# Patient Record
Sex: Female | Born: 1935
Health system: Southern US, Community
[De-identification: ages and names within clinical notes are randomized; demographics above are authoritative.]

## PROBLEM LIST (undated history)

## (undated) DIAGNOSIS — E7800 Pure hypercholesterolemia, unspecified: Secondary | ICD-10-CM

## (undated) DIAGNOSIS — K222 Esophageal obstruction: Secondary | ICD-10-CM

## (undated) DIAGNOSIS — T8859XA Other complications of anesthesia, initial encounter: Secondary | ICD-10-CM

## (undated) DIAGNOSIS — I10 Essential (primary) hypertension: Secondary | ICD-10-CM

## (undated) DIAGNOSIS — R51 Headache: Secondary | ICD-10-CM

## (undated) DIAGNOSIS — R42 Dizziness and giddiness: Secondary | ICD-10-CM

## (undated) DIAGNOSIS — Z9889 Other specified postprocedural states: Secondary | ICD-10-CM

## (undated) DIAGNOSIS — A499 Bacterial infection, unspecified: Secondary | ICD-10-CM

## (undated) DIAGNOSIS — K5790 Diverticulosis of intestine, part unspecified, without perforation or abscess without bleeding: Secondary | ICD-10-CM

## (undated) DIAGNOSIS — K219 Gastro-esophageal reflux disease without esophagitis: Secondary | ICD-10-CM

## (undated) DIAGNOSIS — E039 Hypothyroidism, unspecified: Secondary | ICD-10-CM

## (undated) DIAGNOSIS — R221 Localized swelling, mass and lump, neck: Secondary | ICD-10-CM

## (undated) DIAGNOSIS — R1310 Dysphagia, unspecified: Secondary | ICD-10-CM

## (undated) DIAGNOSIS — G454 Transient global amnesia: Secondary | ICD-10-CM

## (undated) DIAGNOSIS — T4145XA Adverse effect of unspecified anesthetic, initial encounter: Secondary | ICD-10-CM

## (undated) DIAGNOSIS — R112 Nausea with vomiting, unspecified: Secondary | ICD-10-CM

## (undated) DIAGNOSIS — M7989 Other specified soft tissue disorders: Secondary | ICD-10-CM

## (undated) DIAGNOSIS — Z8489 Family history of other specified conditions: Secondary | ICD-10-CM

## (undated) DIAGNOSIS — M1990 Unspecified osteoarthritis, unspecified site: Secondary | ICD-10-CM

## (undated) DIAGNOSIS — N390 Urinary tract infection, site not specified: Secondary | ICD-10-CM

## (undated) DIAGNOSIS — K589 Irritable bowel syndrome without diarrhea: Secondary | ICD-10-CM

## (undated) HISTORY — DX: Bacterial infection, unspecified: N39.0

## (undated) HISTORY — PX: APPENDECTOMY: SHX54

## (undated) HISTORY — DX: Gastro-esophageal reflux disease without esophagitis: K21.9

## (undated) HISTORY — DX: Diverticulosis of intestine, part unspecified, without perforation or abscess without bleeding: K57.90

## (undated) HISTORY — DX: Headache: R51

## (undated) HISTORY — PX: TONSILLECTOMY: SUR1361

## (undated) HISTORY — DX: Pure hypercholesterolemia, unspecified: E78.00

## (undated) HISTORY — PX: CYSTOSCOPY: SUR368

## (undated) HISTORY — DX: Essential (primary) hypertension: I10

## (undated) HISTORY — DX: Bacterial infection, unspecified: A49.9

## (undated) HISTORY — DX: Irritable bowel syndrome, unspecified: K58.9

## (undated) HISTORY — PX: PARTIAL HYSTERECTOMY: SHX80

## (undated) HISTORY — PX: ABDOMINAL HYSTERECTOMY: SHX81

---

## 1988-09-20 HISTORY — PX: CHOLECYSTECTOMY: SHX55

## 2004-12-31 ENCOUNTER — Ambulatory Visit: Payer: Self-pay | Admitting: Unknown Physician Specialty

## 2006-05-06 ENCOUNTER — Ambulatory Visit: Payer: Self-pay | Admitting: Gastroenterology

## 2007-11-21 ENCOUNTER — Ambulatory Visit: Payer: Self-pay | Admitting: Internal Medicine

## 2008-07-30 ENCOUNTER — Ambulatory Visit: Payer: Self-pay | Admitting: Unknown Physician Specialty

## 2009-11-04 ENCOUNTER — Ambulatory Visit: Payer: Self-pay | Admitting: Unknown Physician Specialty

## 2010-12-28 ENCOUNTER — Ambulatory Visit: Payer: Self-pay | Admitting: Gastroenterology

## 2010-12-30 LAB — PATHOLOGY REPORT

## 2011-02-08 ENCOUNTER — Ambulatory Visit: Payer: Self-pay | Admitting: Internal Medicine

## 2011-09-30 DIAGNOSIS — H2510 Age-related nuclear cataract, unspecified eye: Secondary | ICD-10-CM | POA: Diagnosis not present

## 2011-11-03 DIAGNOSIS — Z79899 Other long term (current) drug therapy: Secondary | ICD-10-CM | POA: Diagnosis not present

## 2011-11-03 DIAGNOSIS — I10 Essential (primary) hypertension: Secondary | ICD-10-CM | POA: Diagnosis not present

## 2011-11-03 DIAGNOSIS — E7800 Pure hypercholesterolemia, unspecified: Secondary | ICD-10-CM | POA: Diagnosis not present

## 2011-11-25 DIAGNOSIS — E7800 Pure hypercholesterolemia, unspecified: Secondary | ICD-10-CM | POA: Diagnosis not present

## 2011-11-25 DIAGNOSIS — J019 Acute sinusitis, unspecified: Secondary | ICD-10-CM | POA: Diagnosis not present

## 2011-11-25 DIAGNOSIS — I10 Essential (primary) hypertension: Secondary | ICD-10-CM | POA: Diagnosis not present

## 2011-11-25 DIAGNOSIS — J309 Allergic rhinitis, unspecified: Secondary | ICD-10-CM | POA: Diagnosis not present

## 2011-12-11 DIAGNOSIS — Z1211 Encounter for screening for malignant neoplasm of colon: Secondary | ICD-10-CM | POA: Diagnosis not present

## 2011-12-11 LAB — HEMOCCULT SLIDES (X 3 CARDS)

## 2011-12-16 DIAGNOSIS — K625 Hemorrhage of anus and rectum: Secondary | ICD-10-CM | POA: Diagnosis not present

## 2012-01-06 DIAGNOSIS — R195 Other fecal abnormalities: Secondary | ICD-10-CM | POA: Diagnosis not present

## 2012-01-06 DIAGNOSIS — K449 Diaphragmatic hernia without obstruction or gangrene: Secondary | ICD-10-CM | POA: Diagnosis not present

## 2012-01-06 DIAGNOSIS — Z86010 Personal history of colonic polyps: Secondary | ICD-10-CM | POA: Diagnosis not present

## 2012-02-21 ENCOUNTER — Ambulatory Visit: Payer: Self-pay | Admitting: Gastroenterology

## 2012-02-21 DIAGNOSIS — Z79899 Other long term (current) drug therapy: Secondary | ICD-10-CM | POA: Diagnosis not present

## 2012-02-21 DIAGNOSIS — K5730 Diverticulosis of large intestine without perforation or abscess without bleeding: Secondary | ICD-10-CM | POA: Diagnosis not present

## 2012-02-21 DIAGNOSIS — K589 Irritable bowel syndrome without diarrhea: Secondary | ICD-10-CM | POA: Diagnosis not present

## 2012-02-21 DIAGNOSIS — Z8371 Family history of colonic polyps: Secondary | ICD-10-CM | POA: Diagnosis not present

## 2012-02-21 DIAGNOSIS — K219 Gastro-esophageal reflux disease without esophagitis: Secondary | ICD-10-CM | POA: Diagnosis not present

## 2012-02-21 DIAGNOSIS — Z6829 Body mass index (BMI) 29.0-29.9, adult: Secondary | ICD-10-CM | POA: Diagnosis not present

## 2012-02-21 DIAGNOSIS — I10 Essential (primary) hypertension: Secondary | ICD-10-CM | POA: Diagnosis not present

## 2012-02-21 DIAGNOSIS — K449 Diaphragmatic hernia without obstruction or gangrene: Secondary | ICD-10-CM | POA: Diagnosis not present

## 2012-02-21 DIAGNOSIS — E785 Hyperlipidemia, unspecified: Secondary | ICD-10-CM | POA: Diagnosis not present

## 2012-02-21 DIAGNOSIS — Z7982 Long term (current) use of aspirin: Secondary | ICD-10-CM | POA: Diagnosis not present

## 2012-02-21 DIAGNOSIS — K921 Melena: Secondary | ICD-10-CM | POA: Diagnosis not present

## 2012-02-21 DIAGNOSIS — R195 Other fecal abnormalities: Secondary | ICD-10-CM | POA: Diagnosis not present

## 2012-02-21 DIAGNOSIS — K3189 Other diseases of stomach and duodenum: Secondary | ICD-10-CM | POA: Diagnosis not present

## 2012-06-23 DIAGNOSIS — J328 Other chronic sinusitis: Secondary | ICD-10-CM | POA: Diagnosis not present

## 2012-06-23 DIAGNOSIS — K219 Gastro-esophageal reflux disease without esophagitis: Secondary | ICD-10-CM | POA: Diagnosis not present

## 2012-06-23 DIAGNOSIS — J018 Other acute sinusitis: Secondary | ICD-10-CM | POA: Diagnosis not present

## 2012-06-23 DIAGNOSIS — J342 Deviated nasal septum: Secondary | ICD-10-CM | POA: Diagnosis not present

## 2012-07-07 DIAGNOSIS — J342 Deviated nasal septum: Secondary | ICD-10-CM | POA: Diagnosis not present

## 2012-07-07 DIAGNOSIS — J328 Other chronic sinusitis: Secondary | ICD-10-CM | POA: Diagnosis not present

## 2012-07-07 DIAGNOSIS — K219 Gastro-esophageal reflux disease without esophagitis: Secondary | ICD-10-CM | POA: Diagnosis not present

## 2012-07-11 DIAGNOSIS — H5710 Ocular pain, unspecified eye: Secondary | ICD-10-CM | POA: Diagnosis not present

## 2012-07-23 DIAGNOSIS — Z23 Encounter for immunization: Secondary | ICD-10-CM | POA: Diagnosis not present

## 2012-07-28 DIAGNOSIS — Z1231 Encounter for screening mammogram for malignant neoplasm of breast: Secondary | ICD-10-CM | POA: Diagnosis not present

## 2012-07-28 DIAGNOSIS — N393 Stress incontinence (female) (male): Secondary | ICD-10-CM | POA: Diagnosis not present

## 2012-07-28 DIAGNOSIS — Z79890 Hormone replacement therapy (postmenopausal): Secondary | ICD-10-CM | POA: Diagnosis not present

## 2012-07-28 DIAGNOSIS — Z8739 Personal history of other diseases of the musculoskeletal system and connective tissue: Secondary | ICD-10-CM | POA: Diagnosis not present

## 2012-07-28 DIAGNOSIS — N760 Acute vaginitis: Secondary | ICD-10-CM | POA: Diagnosis not present

## 2012-07-31 ENCOUNTER — Ambulatory Visit: Payer: Self-pay | Admitting: Otolaryngology

## 2012-07-31 DIAGNOSIS — J329 Chronic sinusitis, unspecified: Secondary | ICD-10-CM | POA: Diagnosis not present

## 2012-07-31 DIAGNOSIS — J328 Other chronic sinusitis: Secondary | ICD-10-CM | POA: Diagnosis not present

## 2012-08-11 ENCOUNTER — Encounter: Payer: Self-pay | Admitting: Internal Medicine

## 2012-08-11 ENCOUNTER — Ambulatory Visit (INDEPENDENT_AMBULATORY_CARE_PROVIDER_SITE_OTHER): Payer: Medicare Other | Admitting: Internal Medicine

## 2012-08-11 VITALS — BP 118/74 | HR 67 | Temp 98.3°F | Ht 64.0 in | Wt 157.0 lb

## 2012-08-11 DIAGNOSIS — I10 Essential (primary) hypertension: Secondary | ICD-10-CM

## 2012-08-11 DIAGNOSIS — E7800 Pure hypercholesterolemia, unspecified: Secondary | ICD-10-CM

## 2012-08-11 DIAGNOSIS — K589 Irritable bowel syndrome without diarrhea: Secondary | ICD-10-CM

## 2012-08-11 DIAGNOSIS — K219 Gastro-esophageal reflux disease without esophagitis: Secondary | ICD-10-CM

## 2012-08-11 DIAGNOSIS — R51 Headache: Secondary | ICD-10-CM | POA: Diagnosis not present

## 2012-08-11 LAB — HEPATIC FUNCTION PANEL
ALT: 14 U/L (ref 0–35)
Albumin: 3.7 g/dL (ref 3.5–5.2)
Bilirubin, Direct: 0.1 mg/dL (ref 0.0–0.3)
Total Protein: 7.6 g/dL (ref 6.0–8.3)

## 2012-08-11 LAB — SEDIMENTATION RATE: Sed Rate: 44 mm/hr — ABNORMAL HIGH (ref 0–22)

## 2012-08-11 LAB — BASIC METABOLIC PANEL
Calcium: 9 mg/dL (ref 8.4–10.5)
GFR: 61.2 mL/min (ref >60.00)
Potassium: 4 mEq/L (ref 3.5–5.1)
Sodium: 137 mEq/L (ref 135–145)

## 2012-08-11 LAB — LIPID PANEL
Cholesterol: 152 mg/dL (ref 0–200)
HDL: 70 mg/dL (ref >39.00)
Triglycerides: 217 mg/dL — ABNORMAL HIGH (ref 0.0–149.0)

## 2012-08-11 LAB — C-REACTIVE PROTEIN: CRP: 0.6 mg/dL (ref 0.5–20.0)

## 2012-08-11 NOTE — Patient Instructions (Addendum)
It was nice seeing you today.  I am going to schedule an appt with neurology for evaluation for the persistent problem with your head.  Let me know if any problems.

## 2012-08-12 ENCOUNTER — Encounter: Payer: Self-pay | Admitting: Internal Medicine

## 2012-08-12 ENCOUNTER — Other Ambulatory Visit: Payer: Self-pay | Admitting: Internal Medicine

## 2012-08-12 DIAGNOSIS — R51 Headache: Secondary | ICD-10-CM

## 2012-08-12 NOTE — Progress Notes (Signed)
Subjective:    [name], female    DOB: [birth year], 78 y.o.   MRN: [id number]  HPI 78 year old female with past history of hypertension, hypercholesterolemia, diverticulosis and IBS who comes in today for a scheduled follow up.  States she is doing relatively well.  Still having problems with her head.  She describes a pressure sensation when she coughs or bends over.  Has seen Dr Chestine Spore (ENT).  Had initial CT - ok.  Saw Dr Oren Bracket.  States her eyes checked out fine.  Followed up with Dr Chestine Spore and had repeat scan.  Was told she had some fluid around her eustachian tube.  She continues to have the sensation, but still notices when she coughs or bends over.  She is now on Protonix bid.  Dr Chestine Spore felt her cough was related to acid reflux.  This has helped the cough.  No vision change.  No chest pain or tightness.  Bowels doing ok.   Past Medical History  Diagnosis Date  . Hypercholesterolemia   . Hypertension   . IBS (irritable bowel syndrome)   . GERD (gastroesophageal reflux disease)   . Headache     h/o recurrent vascular headaches  . Diverticulosis      Outpatient Encounter Prescriptions as of 08/11/2012  Medication Sig Dispense Refill  . amitriptyline (ELAVIL) 25 MG tablet Take 25 mg by mouth at bedtime.      Marland Kitchen amLODipine (NORVASC) 10 MG tablet Take 10 mg by mouth daily.      Marland Kitchen aspirin 81 MG tablet Take 81 mg by mouth daily.      Marland Kitchen atorvastatin (LIPITOR) 40 MG tablet Take 40 mg by mouth daily.      Marland Kitchen estrogens, conjugated, (PREMARIN) 0.9 MG tablet Take 0.9 mg by mouth daily. Take daily for 21 days then do not take for 7 days.      . metoprolol succinate (TOPROL-XL) 50 MG 24 hr tablet Take 50 mg by mouth 2 (two) times daily. Take with or immediately following a meal.      . pantoprazole (PROTONIX) 40 MG tablet Take 40 mg by mouth daily.        Review of Systems Patient denies any lightheadedness or dizziness.  Describes the fullness in her head as outlined. No chest  pain, tightness or palpitations.  No increased shortness of breath or congestion.  Cough is better with increasing the Protonix.  No nausea or vomiting.  No abdominal pain or cramping.  No bowel change, such as diarrhea, constipation, BRBPR or melana.  No urine change.        Objective:   Physical Exam Filed Vitals:   08/11/12 0827  BP: 118/74  Pulse: 67  Temp: 98.3 F (59.45 C)   78 year old female in no acute distress.   HEENT:  Nares - clear.  OP- without lesions or erythema.  TMs visualized without erythema.  No significant tenderness to palpation over the sinuses or temporal artery region NECK:  Supple, nontender.     HEART:  Appears to be regular. LUNGS:  Without crackles or wheezing audible.  Respirations even and unlabored.   RADIAL PULSE:  Equal bilaterally.  ABDOMEN:  Soft, nontender.  No audible abdominal bruit.   EXTREMITIES:  No increased edema to be present.                     Assessment & Plan:  HEAD FULLNESS.  Unclear etiology.  Has seen ENT  and Opthalmology.  Scan x 2 negative.  Obtain records. Check ESR and CRP.  Will refer to neurology for evaluation.  Has a remote history of migraines.  Has not had a migraine in years.    GI.  Had a colonoscopy in 11/04/09.  Revealed one polyp, diverticulosis and internal hemorrhoids.  Recommended follow up in 2014.    CAROTID BRUIT.  Documented history of right carotid bruit.  Previous carotid ultrasound revealed no hemodynamically significant stenosis.   HEALTH MAINTENANCE.  Physical 11/25/11.  GI as outlined.  Pelvics, paps and breast exams are done through Dr Jonathon Resides.  Up to date.  Mammogram 07/28/12 - negative.   Had her flu vaccine. Had zostavax six months ago.

## 2012-08-12 NOTE — Assessment & Plan Note (Signed)
On Protonix bid now.  No significant acid reflux.  Cough improved.  Follow.

## 2012-08-12 NOTE — Progress Notes (Signed)
Pt is coming in at 11:00 on 08/14/12 for lab only.  Pt aware.

## 2012-08-12 NOTE — Assessment & Plan Note (Signed)
Blood pressure as outlined.  Same meds.  Follow.  Check metabolic panel.    

## 2012-08-12 NOTE — Assessment & Plan Note (Signed)
Bowels stable.  Colonoscopy as outlined.  Sees Dr Oh.   

## 2012-08-12 NOTE — Assessment & Plan Note (Signed)
Low cholesterol diet and exercise.  Continue lipitor.  Check lipid panel and liver function.  

## 2012-08-14 ENCOUNTER — Other Ambulatory Visit (INDEPENDENT_AMBULATORY_CARE_PROVIDER_SITE_OTHER): Payer: Medicare Other

## 2012-08-14 DIAGNOSIS — R51 Headache: Secondary | ICD-10-CM | POA: Diagnosis not present

## 2012-08-14 LAB — C-REACTIVE PROTEIN: CRP: <0.5 mg/dL (ref 0.5–20.0)

## 2012-08-18 ENCOUNTER — Encounter: Payer: Self-pay | Admitting: *Deleted

## 2012-08-28 DIAGNOSIS — R51 Headache: Secondary | ICD-10-CM | POA: Diagnosis not present

## 2012-09-15 ENCOUNTER — Encounter: Payer: Self-pay | Admitting: Internal Medicine

## 2012-09-20 ENCOUNTER — Telehealth: Payer: Self-pay | Admitting: Internal Medicine

## 2012-09-20 DIAGNOSIS — R5383 Other fatigue: Secondary | ICD-10-CM

## 2012-09-20 DIAGNOSIS — I10 Essential (primary) hypertension: Secondary | ICD-10-CM

## 2012-09-20 DIAGNOSIS — E7800 Pure hypercholesterolemia, unspecified: Secondary | ICD-10-CM

## 2012-09-20 NOTE — Telephone Encounter (Signed)
Pt has a cpe scheduled for 12/11/12.  I placed an order for labs to be done prior to her physical.  Please notify pt and place on lab schedule - an appt for fasting labs 1-2 days before her cpe.  Thanks.

## 2012-09-21 NOTE — Telephone Encounter (Signed)
Lab appointment 3/21 pt aware

## 2012-09-25 DIAGNOSIS — R51 Headache: Secondary | ICD-10-CM | POA: Diagnosis not present

## 2012-09-25 DIAGNOSIS — IMO0002 Reserved for concepts with insufficient information to code with codable children: Secondary | ICD-10-CM | POA: Diagnosis not present

## 2012-09-25 DIAGNOSIS — J328 Other chronic sinusitis: Secondary | ICD-10-CM | POA: Diagnosis not present

## 2012-10-12 ENCOUNTER — Other Ambulatory Visit: Payer: Self-pay | Admitting: Internal Medicine

## 2012-10-12 MED ORDER — ATORVASTATIN CALCIUM 40 MG PO TABS: 40.0000 mg | ORAL_TABLET | Freq: Every day | ORAL | Status: DC

## 2012-10-12 NOTE — Telephone Encounter (Signed)
atorvastatin (LIPITOR) 40 MG tablet  ?

## 2012-10-20 ENCOUNTER — Ambulatory Visit: Payer: Self-pay | Admitting: Neurology

## 2012-10-20 DIAGNOSIS — I6789 Other cerebrovascular disease: Secondary | ICD-10-CM | POA: Diagnosis not present

## 2012-10-20 DIAGNOSIS — R51 Headache: Secondary | ICD-10-CM | POA: Diagnosis not present

## 2012-10-20 DIAGNOSIS — H5710 Ocular pain, unspecified eye: Secondary | ICD-10-CM | POA: Diagnosis not present

## 2012-11-15 ENCOUNTER — Other Ambulatory Visit: Payer: Self-pay | Admitting: *Deleted

## 2012-11-16 ENCOUNTER — Other Ambulatory Visit: Payer: Self-pay | Admitting: *Deleted

## 2012-11-16 MED ORDER — METOPROLOL SUCCINATE ER 50 MG PO TB24: 50.0000 mg | ORAL_TABLET | Freq: Two times a day (BID) | ORAL | Status: DC

## 2012-11-16 NOTE — Telephone Encounter (Signed)
Sent in to pharmacy.  

## 2012-12-08 ENCOUNTER — Other Ambulatory Visit (INDEPENDENT_AMBULATORY_CARE_PROVIDER_SITE_OTHER): Payer: Medicare Other

## 2012-12-08 DIAGNOSIS — E7800 Pure hypercholesterolemia, unspecified: Secondary | ICD-10-CM | POA: Diagnosis not present

## 2012-12-08 DIAGNOSIS — R5381 Other malaise: Secondary | ICD-10-CM | POA: Diagnosis not present

## 2012-12-08 DIAGNOSIS — I10 Essential (primary) hypertension: Secondary | ICD-10-CM | POA: Diagnosis not present

## 2012-12-08 DIAGNOSIS — R5383 Other fatigue: Secondary | ICD-10-CM

## 2012-12-08 LAB — LIPID PANEL
Cholesterol: 151 mg/dL (ref 0–200)
Triglycerides: 244 mg/dL — ABNORMAL HIGH (ref 0.0–149.0)

## 2012-12-08 LAB — CBC WITH DIFFERENTIAL/PLATELET
Basophils Absolute: 0.1 10*3/uL (ref 0.0–0.1)
Eosinophils Absolute: 0.2 10*3/uL (ref 0.0–0.7)
HCT: 38.6 % (ref 36.0–46.0)
Lymphs Abs: 1.4 10*3/uL (ref 0.7–4.0)
MCHC: 33.4 g/dL (ref 30.0–36.0)
MCV: 94.1 fl (ref 78.0–100.0)
Monocytes Absolute: 0.4 10*3/uL (ref 0.1–1.0)
Neutro Abs: 4.8 10*3/uL (ref 1.4–7.7)
Platelets: 202 10*3/uL (ref 150.0–400.0)
RDW: 12.7 % (ref 11.5–14.6)

## 2012-12-08 LAB — HEPATIC FUNCTION PANEL
ALT: 16 U/L (ref 0–35)
AST: 20 U/L (ref 0–37)
Alkaline Phosphatase: 81 U/L (ref 39–117)
Total Bilirubin: 0.6 mg/dL (ref 0.3–1.2)

## 2012-12-08 LAB — BASIC METABOLIC PANEL
Calcium: 9 mg/dL (ref 8.4–10.5)
Creatinine, Ser: 1.1 mg/dL (ref 0.4–1.2)
GFR: 49.45 mL/min — ABNORMAL LOW (ref >60.00)
Sodium: 137 mEq/L (ref 135–145)

## 2012-12-08 LAB — LDL CHOLESTEROL, DIRECT: Direct LDL: 64.9 mg/dL

## 2012-12-11 ENCOUNTER — Encounter: Payer: Self-pay | Admitting: Internal Medicine

## 2012-12-11 ENCOUNTER — Ambulatory Visit (INDEPENDENT_AMBULATORY_CARE_PROVIDER_SITE_OTHER): Payer: Medicare Other | Admitting: Internal Medicine

## 2012-12-11 ENCOUNTER — Ambulatory Visit: Payer: Medicare Other | Admitting: Internal Medicine

## 2012-12-11 VITALS — BP 120/60 | HR 76 | Temp 97.9°F | Ht 64.0 in | Wt 160.5 lb

## 2012-12-11 DIAGNOSIS — K219 Gastro-esophageal reflux disease without esophagitis: Secondary | ICD-10-CM | POA: Diagnosis not present

## 2012-12-11 DIAGNOSIS — I10 Essential (primary) hypertension: Secondary | ICD-10-CM | POA: Diagnosis not present

## 2012-12-11 DIAGNOSIS — K589 Irritable bowel syndrome without diarrhea: Secondary | ICD-10-CM

## 2012-12-11 DIAGNOSIS — E7800 Pure hypercholesterolemia, unspecified: Secondary | ICD-10-CM

## 2012-12-11 NOTE — Patient Instructions (Signed)
Start taking protonix 40mg  one 30 minutes before breakfast and one 30 minutes before your evening meal.  Take ranitidine 150mg  before bed.  Let me know if persistent problems.

## 2012-12-12 ENCOUNTER — Telehealth: Payer: Self-pay | Admitting: Internal Medicine

## 2012-12-12 ENCOUNTER — Encounter: Payer: Self-pay | Admitting: Internal Medicine

## 2012-12-12 DIAGNOSIS — G508 Other disorders of trigeminal nerve: Secondary | ICD-10-CM | POA: Diagnosis not present

## 2012-12-12 NOTE — Assessment & Plan Note (Signed)
Low cholesterol diet and exercise.  Continue lipitor.  Lipid panel 12/08/12 revealed total cholesterol 151, triglycerides 244, HDL 66 and LDL 65.  Follow.      

## 2012-12-12 NOTE — Assessment & Plan Note (Signed)
Blood pressure as outlined.  Same meds.  Follow.  Follow metabolic panel.    

## 2012-12-12 NOTE — Telephone Encounter (Signed)
I look back in patient's chart and saw where you mentioned patient taking protonix bid. I was checking to see if this is ok to fill?

## 2012-12-12 NOTE — Telephone Encounter (Signed)
Ok to send in rx for protonix 40 bid

## 2012-12-12 NOTE — Assessment & Plan Note (Signed)
Was on Protonix bid. Helped some. Back on q day now.  Will increase protonix back to bid and add ranitidine before bed.  Follow.  Get her back in soon to reassess.  If persistent problems, will need referral back to GI.  Follow.

## 2012-12-12 NOTE — Progress Notes (Signed)
Subjective:    [name], female    DOB: [birth year], 79 y.o.   MRN: [id number]  HPI 79 year old female with past history of hypertension, hypercholesterolemia, diverticulosis and IBS who comes in today to follow up on these issues as well as for a complete physical exam.  States she is doing relatively well.  She is seeing Dr Sherryll Burger for her head pain.  See previous note for details.  Had MRI.  Planning to follow up with him tomorrow.  On Neurontin (2 qhs).  Has helped.  She is now on Protonix q day.  Was previously instructed to take it bid and it helped some.   Dr Chestine Spore felt her cough was related to acid reflux.  Throat feels irritated.  No actual dysphagia.  She does have to watche when eating chicken or something dry.  No vision change.  No chest pain or tightness.  Bowels doing ok.  Up to date with her gyn exam.  Sees Dr Jonathon Resides.  Had exam in the fall of 2013.   Past Medical History  Diagnosis Date  . Hypercholesterolemia   . Hypertension   . IBS (irritable bowel syndrome)   . GERD (gastroesophageal reflux disease)   . Headache     h/o recurrent vascular headaches  . Diverticulosis   . Urinary tract bacterial infections      Outpatient Encounter Prescriptions as of 12/11/2012  Medication Sig Dispense Refill  . amitriptyline (ELAVIL) 25 MG tablet Take 25 mg by mouth at bedtime.      Marland Kitchen amLODipine (NORVASC) 10 MG tablet Take 10 mg by mouth daily.      Marland Kitchen aspirin 81 MG tablet Take 81 mg by mouth daily.      Marland Kitchen atorvastatin (LIPITOR) 40 MG tablet Take 1 tablet (40 mg total) by mouth daily.  90 tablet  3  . estrogens, conjugated, (PREMARIN) 0.9 MG tablet Take 0.9 mg by mouth daily. Take daily for 21 days then do not take for 7 days.      Marland Kitchen gabapentin (NEURONTIN) 100 MG capsule Take 100 mg by mouth 2 (two) times daily.      . metoprolol succinate (TOPROL-XL) 50 MG 24 hr tablet Take 1 tablet (50 mg total) by mouth 2 (two) times daily. Take with or immediately following a meal.   180 tablet  1  . pantoprazole (PROTONIX) 40 MG tablet Take 40 mg by mouth daily.       No facility-administered encounter medications on file as of 12/11/2012.    Review of Systems Patient denies any lightheadedness or dizziness.  Head pain is better.   No chest pain, tightness or palpitations.  No increased shortness of breath or congestion.  Swallowing issues as outlined.  Throat feels a little irritated.  No nausea or vomiting.  No abdominal pain or cramping.  No bowel change, such as diarrhea, constipation, BRBPR or melana.  No urine change.        Objective:   Physical Exam  Filed Vitals:   12/11/12 0955  BP: 120/60  Pulse: 76  Temp: 97.9 F (36.6 C)   Blood pressure recheck:  136/68, pulse 69  79 year old female in no acute distress.   HEENT:  Nares- clear.  Oropharynx - without lesions. NECK:  Supple.  Nontender.  HEART:  Appears to be regular. LUNGS:  No crackles or wheezing audible.  Respirations even and unlabored.  RADIAL PULSE:  Equal bilaterally.    BREASTS:  No nipple  discharge or nipple retraction present.  Could not appreciate any distinct nodules or axillary adenopathy.  ABDOMEN:  Soft, nontender.  Bowel sounds present and normal.  No audible abdominal bruit.  GU:  Performed by Dr Jonathon Resides.  Up to date.  EXTREMITIES:  No increased edema present.  DP pulses palpable and equal bilaterally.            Assessment & Plan:  HEAD FULLNESS.  Has seen ENT and Opthalmology.  Scan x 2 negative.  Now seeing Dr Sherryll Burger.  had MRI.  Has follow up tomorrow.  Improved on neurontin.    GI.  Had a colonoscopy in 11/04/09.  Revealed one polyp, diverticulosis and internal hemorrhoids.  Recommended follow up in 2014.    CAROTID BRUIT.  Documented history of right carotid bruit.  Previous carotid ultrasound revealed no hemodynamically significant stenosis.   HEALTH MAINTENANCE.  Physical today.  GI as outlined.  Pelvics, paps and breast exams are done through Dr Jonathon Resides.  Up to date.   Mammogram 07/28/12 - negative.   Had her flu vaccine. Had zostavax six months ago.

## 2012-12-12 NOTE — Assessment & Plan Note (Signed)
Bowels stable.  Colonoscopy as outlined.  Sees Dr Oh.   

## 2012-12-12 NOTE — Telephone Encounter (Signed)
Pt came in today stating dr scott up quantity of pantoprazole tab 40mg  dr   From 1 tablet daily to 2 daily  Pt stated she needed refill  [facility] mail order

## 2012-12-13 MED ORDER — PANTOPRAZOLE SODIUM 40 MG PO TBEC: 40.0000 mg | DELAYED_RELEASE_TABLET | Freq: Two times a day (BID) | ORAL | Status: DC

## 2012-12-13 NOTE — Telephone Encounter (Signed)
Rx sent in to pharmacy. 

## 2013-01-29 DIAGNOSIS — D485 Neoplasm of uncertain behavior of skin: Secondary | ICD-10-CM | POA: Diagnosis not present

## 2013-01-29 DIAGNOSIS — L980 Pyogenic granuloma: Secondary | ICD-10-CM | POA: Diagnosis not present

## 2013-01-29 DIAGNOSIS — L819 Disorder of pigmentation, unspecified: Secondary | ICD-10-CM | POA: Diagnosis not present

## 2013-01-29 DIAGNOSIS — D1801 Hemangioma of skin and subcutaneous tissue: Secondary | ICD-10-CM | POA: Diagnosis not present

## 2013-01-29 DIAGNOSIS — L578 Other skin changes due to chronic exposure to nonionizing radiation: Secondary | ICD-10-CM | POA: Diagnosis not present

## 2013-01-29 DIAGNOSIS — L723 Sebaceous cyst: Secondary | ICD-10-CM | POA: Diagnosis not present

## 2013-02-13 ENCOUNTER — Other Ambulatory Visit: Payer: Self-pay | Admitting: *Deleted

## 2013-02-13 MED ORDER — AMLODIPINE BESYLATE 10 MG PO TABS: 10.0000 mg | ORAL_TABLET | Freq: Every day | ORAL | Status: DC

## 2013-02-16 ENCOUNTER — Ambulatory Visit (INDEPENDENT_AMBULATORY_CARE_PROVIDER_SITE_OTHER): Payer: Medicare Other | Admitting: Internal Medicine

## 2013-02-16 ENCOUNTER — Encounter: Payer: Self-pay | Admitting: Internal Medicine

## 2013-02-16 VITALS — BP 130/65 | HR 74 | Temp 98.7°F | Ht 64.0 in | Wt 162.5 lb

## 2013-02-16 DIAGNOSIS — I10 Essential (primary) hypertension: Secondary | ICD-10-CM | POA: Diagnosis not present

## 2013-02-16 DIAGNOSIS — E7800 Pure hypercholesterolemia, unspecified: Secondary | ICD-10-CM

## 2013-02-16 DIAGNOSIS — R739 Hyperglycemia, unspecified: Secondary | ICD-10-CM

## 2013-02-16 DIAGNOSIS — R7309 Other abnormal glucose: Secondary | ICD-10-CM

## 2013-02-16 DIAGNOSIS — K219 Gastro-esophageal reflux disease without esophagitis: Secondary | ICD-10-CM

## 2013-02-16 DIAGNOSIS — M7989 Other specified soft tissue disorders: Secondary | ICD-10-CM

## 2013-02-16 DIAGNOSIS — K589 Irritable bowel syndrome without diarrhea: Secondary | ICD-10-CM | POA: Diagnosis not present

## 2013-02-18 ENCOUNTER — Encounter: Payer: Self-pay | Admitting: Internal Medicine

## 2013-02-18 DIAGNOSIS — M7989 Other specified soft tissue disorders: Secondary | ICD-10-CM | POA: Insufficient documentation

## 2013-02-18 NOTE — Assessment & Plan Note (Signed)
Bilateral leg swelling - intermittently.  Better in the am.  Worse as the day progresses.  Discussed the need for support hose and leg elevation.  Avoid increased salt intake.  Follow.

## 2013-02-18 NOTE — Assessment & Plan Note (Signed)
Bowels stable.  Colonoscopy as outlined.  Sees Dr Oh.   

## 2013-02-18 NOTE — Assessment & Plan Note (Addendum)
On Protonix in the am and Zantac in the evening.  Symptoms controlled.  Follow.  EGD 02/21/12 revealed hiatal hernia otherwise normal.    

## 2013-02-18 NOTE — Progress Notes (Signed)
Subjective:    [name], female    DOB: 10-Jan-1932, 79 y.o.   MRN: [id number]  HPI 79 year old female with past history of hypertension, hypercholesterolemia, diverticulosis and IBS who comes in today for a scheduled follow up.  States she is doing relatively well.  She is seeing Dr Sherryll Burger for her head pain.  She is now on Protonix q am and Zantac in the evening.  She has stopped the second protonix.  Feels her symptoms are well controlled on the current regimen.   No vision change.  No chest pain or tightness.  Bowels doing ok.  Up to date with her gyn exam.  Sees Dr Jonathon Resides.  Had exam in the fall of 2013.    Past Medical History  Diagnosis Date  . Hypercholesterolemia   . Hypertension   . IBS (irritable bowel syndrome)   . GERD (gastroesophageal reflux disease)   . Headache(784.0)     h/o recurrent vascular headaches  . Diverticulosis   . Urinary tract bacterial infections      Outpatient Encounter Prescriptions as of 02/16/2013  Medication Sig Dispense Refill  . amitriptyline (ELAVIL) 25 MG tablet Take 25 mg by mouth at bedtime.      Marland Kitchen amLODipine (NORVASC) 10 MG tablet Take 1 tablet (10 mg total) by mouth daily.  30 tablet  5  . aspirin 81 MG tablet Take 81 mg by mouth daily.      Marland Kitchen atorvastatin (LIPITOR) 40 MG tablet Take 1 tablet (40 mg total) by mouth daily.  90 tablet  3  . estrogens, conjugated, (PREMARIN) 0.9 MG tablet Take 0.9 mg by mouth daily. Take daily for 21 days then do not take for 7 days.      Marland Kitchen gabapentin (NEURONTIN) 100 MG capsule Take 100 mg by mouth 2 (two) times daily.      . metoprolol succinate (TOPROL-XL) 50 MG 24 hr tablet Take 1 tablet (50 mg total) by mouth 2 (two) times daily. Take with or immediately following a meal.  180 tablet  1  . pantoprazole (PROTONIX) 40 MG tablet Take 1 tablet (40 mg total) by mouth 2 (two) times daily.  180 tablet  1   No facility-administered encounter medications on file as of 02/16/2013.    Review of  Systems Patient denies any lightheadedness or dizziness.  Head pain is better.  Not reported as an issue today.  No chest pain, tightness or palpitations.  No increased shortness of breath or congestion.  Acid reflux and swallowing issues better.  Quit the second protonix a week ago.  No on Protonix in the am and zantac in the evening.  No nausea or vomiting.  No abdominal pain or cramping.  No bowel change, such as diarrhea, constipation, BRBPR or melana.  No urine change.        Objective:   Physical Exam  Filed Vitals:   02/16/13 0945  BP: 130/65  Pulse: 74  Temp: 98.7 F (38.57 C)   79 year old female in no acute distress.   HEENT:  Nares- clear.  Oropharynx - without lesions. NECK:  Supple.  Nontender.  HEART:  Appears to be regular. LUNGS:  No crackles or wheezing audible.  Respirations even and unlabored.  RADIAL PULSE:  Equal bilaterally.    ABDOMEN:  Soft, nontender.  Bowel sounds present and normal.  No audible abdominal bruit. EXTREMITIES:  No increased edema present.  DP pulses palpable and equal bilaterally.  Assessment & Plan:  HEAD FULLNESS.  Has seen ENT and Opthalmology.  Scan x 2 negative.  Now seeing Dr Sherryll Burger.  Had MRI.   Improved on neurontin.  Not reported as an issue today.    HYPERGLYCEMIA.  Sugar slightly increased.  Recheck fasting glucose and a1c.  Low carb diet.  Follow.   GI.  Had a colonoscopy in 11/04/09.  Revealed one polyp, diverticulosis and internal hemorrhoids.  Recommended follow up in 2014.  Follow up colonoscopy 02/21/12 for heme positive stool revealed diverticulosis otherwise normal.    CAROTID BRUIT.  Documented history of right carotid bruit.  Previous carotid ultrasound revealed no hemodynamically significant stenosis.   HEALTH MAINTENANCE.  Physical 12/11/12.  GI as outlined.  Pelvics, paps and breast exams are done through Dr Jonathon Resides.  Up to date.  Mammogram 07/28/12 - negative.

## 2013-02-18 NOTE — Assessment & Plan Note (Signed)
Low cholesterol diet and exercise.  Continue lipitor.  Lipid panel 12/08/12 revealed total cholesterol 151, triglycerides 244, HDL 66 and LDL 65.  Follow.

## 2013-02-18 NOTE — Assessment & Plan Note (Signed)
Blood pressure as outlined.  Same meds.  Follow.  Follow metabolic panel.    

## 2013-02-20 ENCOUNTER — Other Ambulatory Visit: Payer: Self-pay | Admitting: *Deleted

## 2013-02-20 MED ORDER — AMLODIPINE BESYLATE 10 MG PO TABS: 10.0000 mg | ORAL_TABLET | Freq: Every day | ORAL | Status: DC

## 2013-02-20 NOTE — Telephone Encounter (Signed)
Pt called stating the she received a 30 day RX for her Norvasc & a letter stating that Dr. Lorin Picket denied her 54 quantity. I called pt & informed her that it was never denied, just sent in the wrong quantity. I have sent it in again for 90 days supply with one refill.

## 2013-04-02 ENCOUNTER — Other Ambulatory Visit: Payer: Self-pay | Admitting: *Deleted

## 2013-04-02 MED ORDER — PANTOPRAZOLE SODIUM 40 MG PO TBEC: 40.0000 mg | DELAYED_RELEASE_TABLET | Freq: Two times a day (BID) | ORAL | Status: DC

## 2013-04-07 ENCOUNTER — Telehealth: Payer: Self-pay | Admitting: Internal Medicine

## 2013-04-07 DIAGNOSIS — M7989 Other specified soft tissue disorders: Secondary | ICD-10-CM

## 2013-04-07 NOTE — Telephone Encounter (Signed)
pts husband was here for a visit.  Having persistent leg swelling.  Request referral to vascular surgery for evaluation.  Order placed for referral.

## 2013-04-18 ENCOUNTER — Other Ambulatory Visit (INDEPENDENT_AMBULATORY_CARE_PROVIDER_SITE_OTHER): Payer: Medicare Other

## 2013-04-18 DIAGNOSIS — E7800 Pure hypercholesterolemia, unspecified: Secondary | ICD-10-CM | POA: Diagnosis not present

## 2013-04-18 DIAGNOSIS — I10 Essential (primary) hypertension: Secondary | ICD-10-CM | POA: Diagnosis not present

## 2013-04-18 DIAGNOSIS — R7309 Other abnormal glucose: Secondary | ICD-10-CM | POA: Diagnosis not present

## 2013-04-18 DIAGNOSIS — R739 Hyperglycemia, unspecified: Secondary | ICD-10-CM

## 2013-04-18 LAB — BASIC METABOLIC PANEL
CO2: 27 mEq/L (ref 19–32)
Chloride: 104 mEq/L (ref 96–112)
Creatinine, Ser: 1.2 mg/dL (ref 0.4–1.2)
Potassium: 4.6 mEq/L (ref 3.5–5.1)
Sodium: 139 mEq/L (ref 135–145)

## 2013-04-18 LAB — HEPATIC FUNCTION PANEL
ALT: 13 U/L (ref 0–35)
Albumin: 3.5 g/dL (ref 3.5–5.2)
Bilirubin, Direct: 0 mg/dL (ref 0.0–0.3)
Total Protein: 7.2 g/dL (ref 6.0–8.3)

## 2013-04-18 LAB — LIPID PANEL
Cholesterol: 150 mg/dL (ref 0–200)
LDL Cholesterol: 43 mg/dL (ref 0–99)
Triglycerides: 181 mg/dL — ABNORMAL HIGH (ref 0.0–149.0)
VLDL: 36.2 mg/dL (ref 0.0–40.0)

## 2013-04-18 LAB — HEMOGLOBIN A1C: Hgb A1c MFr Bld: 5.8 % (ref 4.6–6.5)

## 2013-04-19 ENCOUNTER — Other Ambulatory Visit: Payer: Self-pay | Admitting: Internal Medicine

## 2013-04-19 DIAGNOSIS — N289 Disorder of kidney and ureter, unspecified: Secondary | ICD-10-CM

## 2013-04-19 NOTE — Progress Notes (Signed)
Order placed for f/u met b and urine.  

## 2013-05-01 DIAGNOSIS — I8310 Varicose veins of unspecified lower extremity with inflammation: Secondary | ICD-10-CM | POA: Diagnosis not present

## 2013-05-04 ENCOUNTER — Telehealth: Payer: Self-pay | Admitting: Internal Medicine

## 2013-05-04 ENCOUNTER — Other Ambulatory Visit (INDEPENDENT_AMBULATORY_CARE_PROVIDER_SITE_OTHER): Payer: Medicare Other

## 2013-05-04 DIAGNOSIS — N289 Disorder of kidney and ureter, unspecified: Secondary | ICD-10-CM

## 2013-05-04 DIAGNOSIS — M79609 Pain in unspecified limb: Secondary | ICD-10-CM | POA: Diagnosis not present

## 2013-05-04 DIAGNOSIS — M7989 Other specified soft tissue disorders: Secondary | ICD-10-CM | POA: Diagnosis not present

## 2013-05-04 DIAGNOSIS — I890 Lymphedema, not elsewhere classified: Secondary | ICD-10-CM | POA: Diagnosis not present

## 2013-05-04 DIAGNOSIS — I8310 Varicose veins of unspecified lower extremity with inflammation: Secondary | ICD-10-CM | POA: Diagnosis not present

## 2013-05-04 LAB — URINALYSIS, ROUTINE W REFLEX MICROSCOPIC
Leukocytes, UA: NEGATIVE
Nitrite: NEGATIVE
RBC / HPF: NONE SEEN (ref 0–?)
Specific Gravity, Urine: 1.015 (ref 1.000–1.030)
Urine Glucose: NEGATIVE
Urobilinogen, UA: 0.2 (ref 0.0–1.0)
WBC, UA: NONE SEEN (ref 0–?)

## 2013-05-04 LAB — BASIC METABOLIC PANEL
BUN: 19 mg/dL (ref 6–23)
Calcium: 8.9 mg/dL (ref 8.4–10.5)
Creatinine, Ser: 1.2 mg/dL (ref 0.4–1.2)
GFR: 44.37 mL/min — ABNORMAL LOW (ref >60.00)

## 2013-05-04 MED ORDER — METOPROLOL SUCCINATE ER 50 MG PO TB24: 50.0000 mg | ORAL_TABLET | Freq: Two times a day (BID) | ORAL | Status: DC

## 2013-05-04 NOTE — Telephone Encounter (Signed)
Pt is needing refill on Meloprolo ER Tablets 50 mg. Pt uses [facility]

## 2013-05-04 NOTE — Telephone Encounter (Signed)
done

## 2013-05-08 ENCOUNTER — Other Ambulatory Visit: Payer: Self-pay | Admitting: Internal Medicine

## 2013-05-08 ENCOUNTER — Encounter: Payer: Self-pay | Admitting: *Deleted

## 2013-05-08 DIAGNOSIS — E7800 Pure hypercholesterolemia, unspecified: Secondary | ICD-10-CM

## 2013-05-08 DIAGNOSIS — I10 Essential (primary) hypertension: Secondary | ICD-10-CM

## 2013-05-08 NOTE — Progress Notes (Signed)
Order placed for f/u labs.  

## 2013-06-12 DIAGNOSIS — R0989 Other specified symptoms and signs involving the circulatory and respiratory systems: Secondary | ICD-10-CM | POA: Diagnosis not present

## 2013-06-12 DIAGNOSIS — Z23 Encounter for immunization: Secondary | ICD-10-CM | POA: Diagnosis not present

## 2013-06-12 DIAGNOSIS — G500 Trigeminal neuralgia: Secondary | ICD-10-CM | POA: Diagnosis not present

## 2013-07-21 LAB — HM MAMMOGRAPHY

## 2013-07-30 DIAGNOSIS — Z01419 Encounter for gynecological examination (general) (routine) without abnormal findings: Secondary | ICD-10-CM | POA: Diagnosis not present

## 2013-07-30 DIAGNOSIS — Z79890 Hormone replacement therapy (postmenopausal): Secondary | ICD-10-CM | POA: Diagnosis not present

## 2013-07-30 DIAGNOSIS — M899 Disorder of bone, unspecified: Secondary | ICD-10-CM | POA: Diagnosis not present

## 2013-07-30 DIAGNOSIS — Z1231 Encounter for screening mammogram for malignant neoplasm of breast: Secondary | ICD-10-CM | POA: Diagnosis not present

## 2013-07-30 DIAGNOSIS — N393 Stress incontinence (female) (male): Secondary | ICD-10-CM | POA: Diagnosis not present

## 2013-07-30 DIAGNOSIS — N94810 Vulvar vestibulitis: Secondary | ICD-10-CM | POA: Diagnosis not present

## 2013-08-07 DIAGNOSIS — M7989 Other specified soft tissue disorders: Secondary | ICD-10-CM | POA: Diagnosis not present

## 2013-08-07 DIAGNOSIS — I8310 Varicose veins of unspecified lower extremity with inflammation: Secondary | ICD-10-CM | POA: Diagnosis not present

## 2013-08-07 DIAGNOSIS — I890 Lymphedema, not elsewhere classified: Secondary | ICD-10-CM | POA: Diagnosis not present

## 2013-08-21 ENCOUNTER — Encounter: Payer: Self-pay | Admitting: Internal Medicine

## 2013-08-21 ENCOUNTER — Ambulatory Visit (INDEPENDENT_AMBULATORY_CARE_PROVIDER_SITE_OTHER): Payer: Medicare Other | Admitting: Internal Medicine

## 2013-08-21 VITALS — BP 130/70 | HR 72 | Temp 97.9°F | Ht 64.0 in | Wt 164.0 lb

## 2013-08-21 DIAGNOSIS — M7989 Other specified soft tissue disorders: Secondary | ICD-10-CM

## 2013-08-21 DIAGNOSIS — K589 Irritable bowel syndrome without diarrhea: Secondary | ICD-10-CM | POA: Diagnosis not present

## 2013-08-21 DIAGNOSIS — I10 Essential (primary) hypertension: Secondary | ICD-10-CM | POA: Diagnosis not present

## 2013-08-21 DIAGNOSIS — E7800 Pure hypercholesterolemia, unspecified: Secondary | ICD-10-CM

## 2013-08-21 DIAGNOSIS — K219 Gastro-esophageal reflux disease without esophagitis: Secondary | ICD-10-CM

## 2013-08-21 LAB — HEPATIC FUNCTION PANEL
ALT: 15 U/L (ref 0–35)
AST: 22 U/L (ref 0–37)
Albumin: 3.7 g/dL (ref 3.5–5.2)
Alkaline Phosphatase: 72 U/L (ref 39–117)
Total Protein: 7.3 g/dL (ref 6.0–8.3)

## 2013-08-21 LAB — BASIC METABOLIC PANEL
Calcium: 9.2 mg/dL (ref 8.4–10.5)
GFR: 48.86 mL/min — ABNORMAL LOW (ref >60.00)
Sodium: 138 mEq/L (ref 135–145)

## 2013-08-21 LAB — LIPID PANEL
Cholesterol: 140 mg/dL (ref 0–200)
HDL: 64.2 mg/dL (ref >39.00)
Total CHOL/HDL Ratio: 2
Triglycerides: 171 mg/dL — ABNORMAL HIGH (ref 0.0–149.0)
VLDL: 34.2 mg/dL (ref 0.0–40.0)

## 2013-08-21 MED ORDER — METOPROLOL SUCCINATE ER 50 MG PO TB24: 50.0000 mg | ORAL_TABLET | Freq: Two times a day (BID) | ORAL | Status: DC

## 2013-08-21 MED ORDER — ATORVASTATIN CALCIUM 40 MG PO TABS: 40.0000 mg | ORAL_TABLET | Freq: Every day | ORAL | Status: DC

## 2013-08-21 NOTE — Progress Notes (Signed)
Subjective:    [name], female    DOB: 1932/05/10, 79 y.o.   MRN: [id number]  HPI 79 year old female with past history of hypertension, hypercholesterolemia, diverticulosis and IBS who comes in today for a scheduled follow up.  States she is doing relatively well.  She is seeing Dr Sherryll Burger for her head pain.  Not  Significant issue for her now.  She is now on Protonix q am and Zantac in the evening.  She has stopped the second protonix.  Feels her symptoms are well controlled on the current regimen.   No vision change.  No chest pain or tightness.  Bowels doing ok.  Up to date with her gyn exam.  Sees Dr Jonathon Resides.  Just had her exam.     Past Medical History  Diagnosis Date  . Hypercholesterolemia   . Hypertension   . IBS (irritable bowel syndrome)   . GERD (gastroesophageal reflux disease)   . Headache(784.0)     h/o recurrent vascular headaches  . Diverticulosis   . Urinary tract bacterial infections      Outpatient Encounter Prescriptions as of 08/21/2013  Medication Sig  . amitriptyline (ELAVIL) 25 MG tablet Take 25 mg by mouth at bedtime.  Marland Kitchen amLODipine (NORVASC) 10 MG tablet Take 1 tablet (10 mg total) by mouth daily.  Marland Kitchen aspirin 81 MG tablet Take 81 mg by mouth daily.  Marland Kitchen atorvastatin (LIPITOR) 40 MG tablet Take 1 tablet (40 mg total) by mouth daily.  Marland Kitchen estrogens, conjugated, (PREMARIN) 0.9 MG tablet Take 0.9 mg by mouth daily.   Marland Kitchen gabapentin (NEURONTIN) 100 MG capsule Take 100 mg by mouth 2 (two) times daily.  . metoprolol succinate (TOPROL-XL) 50 MG 24 hr tablet Take 1 tablet (50 mg total) by mouth 2 (two) times daily. Take with or immediately following a meal.  . pantoprazole (PROTONIX) 40 MG tablet Take 1 tablet (40 mg total) by mouth 2 (two) times daily.    Review of Systems Patient denies any lightheadedness or dizziness.  Head pain is better.  Not reported as an issue today.  No chest pain, tightness or palpitations.  No increased shortness of breath or  congestion.  Acid reflux controlled.  No nausea or vomiting.  No abdominal pain or cramping.  No bowel change, such as diarrhea, constipation, BRBPR or melana.  No urine change.        Objective:   Physical Exam  Filed Vitals:   08/21/13 1001  BP: 130/70  Pulse: 72  Temp: 97.9 F (3.65 C)   79 year old female in no acute distress.   HEENT:  Nares- clear.  Oropharynx - without lesions. NECK:  Supple.  Nontender.  HEART:  Appears to be regular. LUNGS:  No crackles or wheezing audible.  Respirations even and unlabored.  RADIAL PULSE:  Equal bilaterally.    ABDOMEN:  Soft, nontender.  Bowel sounds present and normal.  No audible abdominal bruit. EXTREMITIES:  No increased edema present.  DP pulses palpable and equal bilaterally.            Assessment & Plan:  HEAD FULLNESS.  Has seen ENT and Opthalmology.  Scan x 2 negative.  Now seeing Dr Sherryll Burger.  Had MRI.   Improved on neurontin.  Not reported as an issue today.    HYPERGLYCEMIA.  Sugar slightly increased.  Recheck fasting glucose and a1c.  Low carb diet.  Follow.   GI.  Had a colonoscopy in 11/04/09.  Revealed one polyp,  diverticulosis and internal hemorrhoids.  Recommended follow up in 2014.  Follow up colonoscopy 02/21/12 for heme positive stool revealed diverticulosis otherwise normal.    CAROTID BRUIT.  Documented history of right carotid bruit.  Previous carotid ultrasound revealed no hemodynamically significant stenosis.   HEALTH MAINTENANCE.  Physical 12/11/12.  GI as outlined.  Pelvics, paps and breast exams are done through Dr Jonathon Resides.  Up to date.  Mammogram 11/14 - negative.

## 2013-08-21 NOTE — Progress Notes (Signed)
Pre-visit discussion using our clinic review tool. No additional management support is needed unless otherwise documented below in the visit note.  

## 2013-08-24 ENCOUNTER — Encounter: Payer: Self-pay | Admitting: *Deleted

## 2013-08-25 ENCOUNTER — Encounter: Payer: Self-pay | Admitting: Internal Medicine

## 2013-08-25 NOTE — Assessment & Plan Note (Signed)
Improved.  Using support hose and pumps.  Follow.   

## 2013-08-25 NOTE — Assessment & Plan Note (Signed)
Low cholesterol diet and exercise.  Continue lipitor.  Follow lipid panel and liver function.   

## 2013-08-25 NOTE — Assessment & Plan Note (Signed)
Blood pressure as outlined.  Same meds.  Follow.  Follow metabolic panel.    

## 2013-08-25 NOTE — Assessment & Plan Note (Signed)
On Protonix in the am and Zantac in the evening.  Symptoms controlled.  Follow.  EGD 02/21/12 revealed hiatal hernia otherwise normal.    

## 2013-08-25 NOTE — Assessment & Plan Note (Signed)
Bowels stable.  Colonoscopy as outlined.  Sees Dr Oh.   

## 2013-08-30 ENCOUNTER — Other Ambulatory Visit: Payer: Self-pay | Admitting: *Deleted

## 2013-08-30 MED ORDER — AMLODIPINE BESYLATE 10 MG PO TABS: 10.0000 mg | ORAL_TABLET | Freq: Every day | ORAL | Status: DC

## 2013-09-07 ENCOUNTER — Other Ambulatory Visit: Payer: Self-pay | Admitting: *Deleted

## 2013-09-07 MED ORDER — PANTOPRAZOLE SODIUM 40 MG PO TBEC: 40.0000 mg | DELAYED_RELEASE_TABLET | Freq: Two times a day (BID) | ORAL | Status: DC

## 2013-11-07 DIAGNOSIS — H2510 Age-related nuclear cataract, unspecified eye: Secondary | ICD-10-CM | POA: Diagnosis not present

## 2013-12-11 ENCOUNTER — Telehealth: Payer: Self-pay | Admitting: *Deleted

## 2013-12-11 DIAGNOSIS — K219 Gastro-esophageal reflux disease without esophagitis: Secondary | ICD-10-CM

## 2013-12-11 DIAGNOSIS — I10 Essential (primary) hypertension: Secondary | ICD-10-CM

## 2013-12-11 DIAGNOSIS — M7989 Other specified soft tissue disorders: Secondary | ICD-10-CM

## 2013-12-11 DIAGNOSIS — E7800 Pure hypercholesterolemia, unspecified: Secondary | ICD-10-CM

## 2013-12-11 DIAGNOSIS — R739 Hyperglycemia, unspecified: Secondary | ICD-10-CM

## 2013-12-11 NOTE — Telephone Encounter (Signed)
Pt is coming in for labs tomorrow what labs and dX?

## 2013-12-11 NOTE — Telephone Encounter (Signed)
Order placed for f/u labs.  

## 2013-12-12 ENCOUNTER — Other Ambulatory Visit (INDEPENDENT_AMBULATORY_CARE_PROVIDER_SITE_OTHER): Payer: Medicare Other

## 2013-12-12 ENCOUNTER — Encounter (INDEPENDENT_AMBULATORY_CARE_PROVIDER_SITE_OTHER): Payer: Self-pay

## 2013-12-12 DIAGNOSIS — M7989 Other specified soft tissue disorders: Secondary | ICD-10-CM

## 2013-12-12 DIAGNOSIS — E7800 Pure hypercholesterolemia, unspecified: Secondary | ICD-10-CM

## 2013-12-12 DIAGNOSIS — I10 Essential (primary) hypertension: Secondary | ICD-10-CM | POA: Diagnosis not present

## 2013-12-12 DIAGNOSIS — R739 Hyperglycemia, unspecified: Secondary | ICD-10-CM

## 2013-12-12 DIAGNOSIS — R7309 Other abnormal glucose: Secondary | ICD-10-CM | POA: Diagnosis not present

## 2013-12-12 LAB — BASIC METABOLIC PANEL
BUN: 15 mg/dL (ref 6–23)
CALCIUM: 9.6 mg/dL (ref 8.4–10.5)
CO2: 25 mEq/L (ref 19–32)
CREATININE: 1.1 mg/dL (ref 0.4–1.2)
Chloride: 103 mEq/L (ref 96–112)
GFR: 49.83 mL/min — ABNORMAL LOW (ref >60.00)
Glucose, Bld: 99 mg/dL (ref 70–99)
Potassium: 4.7 mEq/L (ref 3.5–5.1)
Sodium: 138 mEq/L (ref 135–145)

## 2013-12-12 LAB — CBC WITH DIFFERENTIAL/PLATELET
BASOS PCT: 0.5 % (ref 0.0–3.0)
Basophils Absolute: 0 10*3/uL (ref 0.0–0.1)
EOS ABS: 0.3 10*3/uL (ref 0.0–0.7)
Eosinophils Relative: 3.3 % (ref 0.0–5.0)
HCT: 38.3 % (ref 36.0–46.0)
HEMOGLOBIN: 12.8 g/dL (ref 12.0–15.0)
LYMPHS ABS: 1.5 10*3/uL (ref 0.7–4.0)
LYMPHS PCT: 18.7 % (ref 12.0–46.0)
MCHC: 33.5 g/dL (ref 30.0–36.0)
MCV: 94.2 fl (ref 78.0–100.0)
Monocytes Absolute: 0.5 10*3/uL (ref 0.1–1.0)
Monocytes Relative: 6.2 % (ref 3.0–12.0)
NEUTROS ABS: 5.6 10*3/uL (ref 1.4–7.7)
Neutrophils Relative %: 71.3 % (ref 43.0–77.0)
Platelets: 204 10*3/uL (ref 150.0–400.0)
RBC: 4.07 Mil/uL (ref 3.87–5.11)
RDW: 12.5 % (ref 11.5–14.6)
WBC: 7.9 10*3/uL (ref 4.5–10.5)

## 2013-12-12 LAB — LIPID PANEL
CHOL/HDL RATIO: 2
Cholesterol: 156 mg/dL (ref 0–200)
HDL: 71.3 mg/dL (ref >39.00)
LDL Cholesterol: 51 mg/dL (ref 0–99)
Triglycerides: 170 mg/dL — ABNORMAL HIGH (ref 0.0–149.0)
VLDL: 34 mg/dL (ref 0.0–40.0)

## 2013-12-12 LAB — HEPATIC FUNCTION PANEL
ALBUMIN: 3.7 g/dL (ref 3.5–5.2)
ALT: 17 U/L (ref 0–35)
AST: 22 U/L (ref 0–37)
Alkaline Phosphatase: 85 U/L (ref 39–117)
Bilirubin, Direct: 0.1 mg/dL (ref 0.0–0.3)
TOTAL PROTEIN: 6.9 g/dL (ref 6.0–8.3)
Total Bilirubin: 0.6 mg/dL (ref 0.3–1.2)

## 2013-12-12 LAB — HEMOGLOBIN A1C: HEMOGLOBIN A1C: 5.7 % (ref 4.6–6.5)

## 2013-12-17 ENCOUNTER — Ambulatory Visit (INDEPENDENT_AMBULATORY_CARE_PROVIDER_SITE_OTHER): Payer: Medicare Other | Admitting: Internal Medicine

## 2013-12-17 ENCOUNTER — Encounter: Payer: Self-pay | Admitting: Internal Medicine

## 2013-12-17 VITALS — BP 120/66 | HR 66 | Temp 98.3°F | Resp 16 | Ht 61.0 in | Wt 163.2 lb

## 2013-12-17 DIAGNOSIS — R739 Hyperglycemia, unspecified: Secondary | ICD-10-CM

## 2013-12-17 DIAGNOSIS — M549 Dorsalgia, unspecified: Secondary | ICD-10-CM

## 2013-12-17 DIAGNOSIS — R7309 Other abnormal glucose: Secondary | ICD-10-CM

## 2013-12-17 DIAGNOSIS — R5383 Other fatigue: Secondary | ICD-10-CM

## 2013-12-17 DIAGNOSIS — R5381 Other malaise: Secondary | ICD-10-CM

## 2013-12-17 DIAGNOSIS — K589 Irritable bowel syndrome without diarrhea: Secondary | ICD-10-CM

## 2013-12-17 DIAGNOSIS — E7800 Pure hypercholesterolemia, unspecified: Secondary | ICD-10-CM | POA: Diagnosis not present

## 2013-12-17 DIAGNOSIS — M7989 Other specified soft tissue disorders: Secondary | ICD-10-CM

## 2013-12-17 DIAGNOSIS — I10 Essential (primary) hypertension: Secondary | ICD-10-CM

## 2013-12-17 DIAGNOSIS — Z23 Encounter for immunization: Secondary | ICD-10-CM | POA: Diagnosis not present

## 2013-12-17 DIAGNOSIS — K219 Gastro-esophageal reflux disease without esophagitis: Secondary | ICD-10-CM

## 2013-12-17 NOTE — Progress Notes (Signed)
Subjective:    [name], female    DOB: [birth year], 80 y.o.   MRN: [id number]  HPI 80 year old female with past history of hypertension, hypercholesterolemia, diverticulosis and IBS who comes in today to follow up on these issues as well as for a complete physical exam.   States she is doing relatively well.  She is seeing Dr Manuella Ghazi for her head pain.  Not a significant issue for her now.  She is now on Protonix q am and Zantac in the evening.  She has stopped the second protonix.  Feels her symptoms are well controlled on the current regimen.   No vision change.  No chest pain or tightness.  Bowels doing ok.  Up to date with her gyn exam.  Sees Dr Arsenio Katz.  She has noticed some low back pain.  No radiation of pain.  Persistent intermittent low back pain.  No problems with swelling.  Is having some issues with toe nail fungus.  Discussed treatment options.     Past Medical History  Diagnosis Date  . Hypercholesterolemia   . Hypertension   . IBS (irritable bowel syndrome)   . GERD (gastroesophageal reflux disease)   . Headache(784.0)     h/o recurrent vascular headaches  . Diverticulosis   . Urinary tract bacterial infections      Outpatient Encounter Prescriptions as of 12/17/2013  Medication Sig  . amitriptyline (ELAVIL) 25 MG tablet Take 25 mg by mouth at bedtime.  Marland Kitchen amLODipine (NORVASC) 10 MG tablet Take 1 tablet (10 mg total) by mouth daily.  Marland Kitchen aspirin 81 MG tablet Take 81 mg by mouth daily.  Marland Kitchen atorvastatin (LIPITOR) 40 MG tablet Take 1 tablet (40 mg total) by mouth daily.  Marland Kitchen estrogens, conjugated, (PREMARIN) 0.9 MG tablet Take 0.9 mg by mouth daily.   Marland Kitchen gabapentin (NEURONTIN) 100 MG capsule Take 100 mg by mouth 2 (two) times daily.  . metoprolol succinate (TOPROL-XL) 50 MG 24 hr tablet Take 1 tablet (50 mg total) by mouth 2 (two) times daily. Take with or immediately following a meal.  . pantoprazole (PROTONIX) 40 MG tablet Take 1 tablet (40 mg total) by mouth 2  (two) times daily.    Review of Systems Patient denies any lightheadedness or dizziness.  Head pain is better.  Not reported as an issue today.  No chest pain, tightness or palpitations.  No increased shortness of breath or congestion.  Acid reflux controlled.  No nausea or vomiting.  No abdominal pain or cramping.  No bowel change, such as diarrhea, constipation, BRBPR or melana.  No urine change. No problems with swelling. Low back pain as outlined.  No radiation of pain.         Objective:   Physical Exam  Filed Vitals:   12/17/13 1054  BP: 120/66  Pulse: 66  Temp: 98.3 F (36.8 C)  Resp: 48   80 year old female in no acute distress.   HEENT:  Nares- clear.  Oropharynx - without lesions. NECK:  Supple.  Nontender.  No audible bruit.  HEART:  Appears to be regular. LUNGS:  No crackles or wheezing audible.  Respirations even and unlabored.  RADIAL PULSE:  Equal bilaterally.    BREASTS:  No nipple discharge or nipple retraction present.  Could not appreciate any distinct nodules or axillary adenopathy.  ABDOMEN:  Soft, nontender.  Bowel sounds present and normal.  No audible abdominal bruit.  GU:  Not performed.     EXTREMITIES:  No increased edema present.  DP pulses palpable and equal bilaterally.      BACK:  No significant pain with straight leg raise.  No significant pain to palpation - lower back.         Assessment & Plan:  HEAD FULLNESS.  Has seen ENT and Opthalmology.  Scan x 2 negative.  Now seeing Dr Manuella Ghazi.  Had MRI.   Improved on neurontin.  Not reported as an issue today.    GI.  Had a colonoscopy in 11/04/09.  Revealed one polyp, diverticulosis and internal hemorrhoids.  Recommended follow up in 2014.  Follow up colonoscopy 02/21/12 for heme positive stool revealed diverticulosis otherwise normal.    CAROTID BRUIT.  Documented history of right carotid bruit.  Previous carotid ultrasound revealed no hemodynamically significant stenosis.   HEALTH MAINTENANCE.  Physical  today.  GI as outlined.  Pelvics, paps and breast exams are done through Dr Arsenio Katz.  Up to date.  Mammogram 11/14 - negative.

## 2013-12-17 NOTE — Progress Notes (Signed)
Pre visit review using our clinic review tool, if applicable. No additional management support is needed unless otherwise documented below in the visit note. 

## 2013-12-18 ENCOUNTER — Ambulatory Visit (INDEPENDENT_AMBULATORY_CARE_PROVIDER_SITE_OTHER)
Admission: RE | Admit: 2013-12-18 | Discharge: 2013-12-18 | Disposition: A | Discharge status: Home / Self Care | Payer: Medicare Other | Source: Ambulatory Visit | Attending: Internal Medicine | Admitting: Internal Medicine

## 2013-12-18 DIAGNOSIS — M549 Dorsalgia, unspecified: Secondary | ICD-10-CM | POA: Diagnosis not present

## 2013-12-18 DIAGNOSIS — M47817 Spondylosis without myelopathy or radiculopathy, lumbosacral region: Secondary | ICD-10-CM | POA: Diagnosis not present

## 2013-12-21 ENCOUNTER — Other Ambulatory Visit: Payer: Self-pay | Admitting: Internal Medicine

## 2013-12-21 ENCOUNTER — Encounter: Payer: Self-pay | Admitting: Internal Medicine

## 2013-12-21 DIAGNOSIS — R739 Hyperglycemia, unspecified: Secondary | ICD-10-CM | POA: Insufficient documentation

## 2013-12-21 DIAGNOSIS — M549 Dorsalgia, unspecified: Secondary | ICD-10-CM | POA: Insufficient documentation

## 2013-12-21 NOTE — Assessment & Plan Note (Signed)
On Protonix in the am and Zantac in the evening.  Symptoms controlled.  Follow.  EGD 02/21/12 revealed hiatal hernia otherwise normal.    

## 2013-12-21 NOTE — Assessment & Plan Note (Addendum)
Blood pressure as outlined.  Same meds.  Follow.  Follow metabolic panel.  Cr just checked - 1.1. Follow.

## 2013-12-21 NOTE — Assessment & Plan Note (Signed)
Bowels stable.  Colonoscopy as outlined.  Sees Dr Oh.   

## 2013-12-21 NOTE — Progress Notes (Signed)
Order placed for physical therapy referral.

## 2013-12-21 NOTE — Assessment & Plan Note (Signed)
Low carb diet.  Follow.  a1c just checked 12/12/13 - 5.7.

## 2013-12-21 NOTE — Assessment & Plan Note (Signed)
Low cholesterol diet and exercise.  Continue lipitor.  Follow lipid panel and liver function.  Cholesterol checked 12/12/13 revealed total cholesterol 156, triglycerides 170, HDL 71 and LDL 51.

## 2013-12-21 NOTE — Assessment & Plan Note (Signed)
Improved.  Using support hose and pumps.  Follow.   

## 2013-12-21 NOTE — Assessment & Plan Note (Signed)
Low back pain.  Exam as outlined.  Check L-S spine ray.  Further w/u pending.

## 2014-01-04 DIAGNOSIS — M549 Dorsalgia, unspecified: Secondary | ICD-10-CM | POA: Diagnosis not present

## 2014-01-08 DIAGNOSIS — R51 Headache: Secondary | ICD-10-CM | POA: Diagnosis not present

## 2014-01-08 DIAGNOSIS — M549 Dorsalgia, unspecified: Secondary | ICD-10-CM | POA: Diagnosis not present

## 2014-01-14 DIAGNOSIS — M549 Dorsalgia, unspecified: Secondary | ICD-10-CM | POA: Diagnosis not present

## 2014-01-16 DIAGNOSIS — M549 Dorsalgia, unspecified: Secondary | ICD-10-CM | POA: Diagnosis not present

## 2014-01-18 DIAGNOSIS — M549 Dorsalgia, unspecified: Secondary | ICD-10-CM | POA: Diagnosis not present

## 2014-01-21 DIAGNOSIS — M549 Dorsalgia, unspecified: Secondary | ICD-10-CM | POA: Diagnosis not present

## 2014-01-25 DIAGNOSIS — M549 Dorsalgia, unspecified: Secondary | ICD-10-CM | POA: Diagnosis not present

## 2014-02-08 DIAGNOSIS — M79609 Pain in unspecified limb: Secondary | ICD-10-CM | POA: Diagnosis not present

## 2014-02-08 DIAGNOSIS — I890 Lymphedema, not elsewhere classified: Secondary | ICD-10-CM | POA: Diagnosis not present

## 2014-02-08 DIAGNOSIS — M7989 Other specified soft tissue disorders: Secondary | ICD-10-CM | POA: Diagnosis not present

## 2014-03-07 ENCOUNTER — Other Ambulatory Visit: Payer: Self-pay | Admitting: Internal Medicine

## 2014-04-23 ENCOUNTER — Other Ambulatory Visit: Payer: Self-pay | Admitting: Internal Medicine

## 2014-06-17 ENCOUNTER — Other Ambulatory Visit (INDEPENDENT_AMBULATORY_CARE_PROVIDER_SITE_OTHER): Payer: Medicare Other

## 2014-06-17 DIAGNOSIS — I10 Essential (primary) hypertension: Secondary | ICD-10-CM

## 2014-06-17 DIAGNOSIS — R7309 Other abnormal glucose: Secondary | ICD-10-CM

## 2014-06-17 DIAGNOSIS — R5381 Other malaise: Secondary | ICD-10-CM

## 2014-06-17 DIAGNOSIS — R5383 Other fatigue: Secondary | ICD-10-CM

## 2014-06-17 DIAGNOSIS — R739 Hyperglycemia, unspecified: Secondary | ICD-10-CM

## 2014-06-17 DIAGNOSIS — E7800 Pure hypercholesterolemia, unspecified: Secondary | ICD-10-CM

## 2014-06-17 LAB — LIPID PANEL
CHOLESTEROL: 151 mg/dL (ref 0–200)
HDL: 71.5 mg/dL (ref >39.00)
LDL Cholesterol: 51 mg/dL (ref 0–99)
NonHDL: 79.5
TRIGLYCERIDES: 144 mg/dL (ref 0.0–149.0)
Total CHOL/HDL Ratio: 2
VLDL: 28.8 mg/dL (ref 0.0–40.0)

## 2014-06-17 LAB — HEPATIC FUNCTION PANEL
ALT: 15 U/L (ref 0–35)
AST: 25 U/L (ref 0–37)
Albumin: 3.8 g/dL (ref 3.5–5.2)
Alkaline Phosphatase: 81 U/L (ref 39–117)
BILIRUBIN TOTAL: 0.5 mg/dL (ref 0.2–1.2)
Bilirubin, Direct: 0.1 mg/dL (ref 0.0–0.3)
Total Protein: 7.6 g/dL (ref 6.0–8.3)

## 2014-06-17 LAB — BASIC METABOLIC PANEL
BUN: 20 mg/dL (ref 6–23)
CO2: 23 mEq/L (ref 19–32)
Calcium: 9.1 mg/dL (ref 8.4–10.5)
Chloride: 102 mEq/L (ref 96–112)
Creatinine, Ser: 1.2 mg/dL (ref 0.4–1.2)
GFR: 47.32 mL/min — ABNORMAL LOW (ref >60.00)
GLUCOSE: 106 mg/dL — AB (ref 70–99)
Potassium: 4.6 mEq/L (ref 3.5–5.1)
Sodium: 137 mEq/L (ref 135–145)

## 2014-06-17 LAB — HEMOGLOBIN A1C: Hgb A1c MFr Bld: 5.5 % (ref 4.6–6.5)

## 2014-06-17 LAB — TSH: TSH: 7.57 u[IU]/mL — ABNORMAL HIGH (ref 0.35–4.50)

## 2014-06-20 ENCOUNTER — Ambulatory Visit (INDEPENDENT_AMBULATORY_CARE_PROVIDER_SITE_OTHER): Payer: Medicare Other | Admitting: Internal Medicine

## 2014-06-20 ENCOUNTER — Encounter: Payer: Self-pay | Admitting: Internal Medicine

## 2014-06-20 VITALS — BP 120/70 | HR 76 | Temp 98.2°F | Ht 61.0 in | Wt 158.5 lb

## 2014-06-20 DIAGNOSIS — R739 Hyperglycemia, unspecified: Secondary | ICD-10-CM

## 2014-06-20 DIAGNOSIS — Z23 Encounter for immunization: Secondary | ICD-10-CM

## 2014-06-20 DIAGNOSIS — R7989 Other specified abnormal findings of blood chemistry: Secondary | ICD-10-CM

## 2014-06-20 DIAGNOSIS — E7800 Pure hypercholesterolemia, unspecified: Secondary | ICD-10-CM

## 2014-06-20 DIAGNOSIS — R0989 Other specified symptoms and signs involving the circulatory and respiratory systems: Secondary | ICD-10-CM | POA: Diagnosis not present

## 2014-06-20 DIAGNOSIS — K589 Irritable bowel syndrome without diarrhea: Secondary | ICD-10-CM

## 2014-06-20 DIAGNOSIS — K219 Gastro-esophageal reflux disease without esophagitis: Secondary | ICD-10-CM | POA: Diagnosis not present

## 2014-06-20 DIAGNOSIS — I10 Essential (primary) hypertension: Secondary | ICD-10-CM

## 2014-06-20 DIAGNOSIS — R946 Abnormal results of thyroid function studies: Secondary | ICD-10-CM

## 2014-06-20 DIAGNOSIS — M7989 Other specified soft tissue disorders: Secondary | ICD-10-CM

## 2014-06-20 NOTE — Assessment & Plan Note (Signed)
Schedule carotid ultrasound.  

## 2014-06-20 NOTE — Progress Notes (Signed)
Subjective:    [name], female    DOB: 1932-06-14, 80 y.o.   MRN: [id number]  HPI 80 year old female with past history of hypertension, hypercholesterolemia, diverticulosis and IBS who comes in today for a scheduled follow up.  States she is doing relatively well.  She is seeing Dr Manuella Ghazi for her head pain.  Not a significant issue for her now.  She is now on Protonix q am and Zantac in the evening.  Acid reflux controlled.  No vision change.  No chest pain or tightness.  Bowels doing ok.  Up to date with her gyn exam.  Has been seeing Dr Arsenio Katz.  No increased leg swelling.     Past Medical History  Diagnosis Date  . Hypercholesterolemia   . Hypertension   . IBS (irritable bowel syndrome)   . GERD (gastroesophageal reflux disease)   . Headache(784.0)     h/o recurrent vascular headaches  . Diverticulosis   . Urinary tract bacterial infections      Outpatient Encounter Prescriptions as of 06/20/2014  Medication Sig  . amitriptyline (ELAVIL) 25 MG tablet Take 25 mg by mouth at bedtime.  Marland Kitchen amLODipine (NORVASC) 10 MG tablet TAKE 1 TABLET DAILY  . aspirin 81 MG tablet Take 81 mg by mouth daily.  Marland Kitchen atorvastatin (LIPITOR) 40 MG tablet Take 1 tablet (40 mg total) by mouth daily.  Marland Kitchen estrogens, conjugated, (PREMARIN) 0.9 MG tablet Take 0.9 mg by mouth daily.   . metoprolol succinate (TOPROL-XL) 50 MG 24 hr tablet Take 1 tablet (50 mg total) by mouth 2 (two) times daily. Take with or immediately following a meal.  . pantoprazole (PROTONIX) 40 MG tablet TAKE 1 TABLET DAILY  . [DISCONTINUED] gabapentin (NEURONTIN) 100 MG capsule Take 100 mg by mouth 2 (two) times daily.  . [DISCONTINUED] pantoprazole (PROTONIX) 40 MG tablet TAKE 1 TABLET TWICE A DAY    Review of Systems Patient denies any lightheadedness or dizziness.  Head pain is better.  Not reported as an issue today.  No chest pain, tightness or palpitations.  No increased shortness of breath or congestion.  Acid reflux  controlled.  No nausea or vomiting.  No abdominal pain or cramping.  No bowel change, such as diarrhea, constipation, BRBPR or melana.  No urine change. No increased lower extremity swelling.         Objective:   Physical Exam  Filed Vitals:   06/20/14 1054  BP: 120/70  Pulse: 76  Temp: 98.2 F (36.8 C)   Blood pressure recheck:  6/44  80 year old female in no acute distress.   HEENT:  Nares- clear.  Oropharynx - without lesions. NECK:  Supple.  Nontender.  Right carotid bruit.   HEART:  Appears to be regular. LUNGS:  No crackles or wheezing audible.  Respirations even and unlabored.  RADIAL PULSE:  Equal bilaterally.  ABDOMEN:  Soft, nontender.  Bowel sounds present and normal.  No audible abdominal bruit.    EXTREMITIES:  No increased edema present.  DP pulses palpable and equal bilaterally.          Assessment & Plan:  HEAD FULLNESS.  Has seen ENT and Opthalmology.  Scan x 2 negative.  Now seeing Dr Manuella Ghazi.  Had MRI.   Improved on neurontin.  Not reported as an issue today.    GI.  Had a colonoscopy in 11/04/09.  Revealed one polyp, diverticulosis and internal hemorrhoids.  Recommended follow up in 2014.  Follow up  colonoscopy 02/21/12 for heme positive stool revealed diverticulosis otherwise normal.    CAROTID BRUIT.  Documented history of right carotid bruit.  Previous carotid ultrasound revealed no hemodynamically significant stenosis.  Recheck carotid ultrasound.      HEALTH MAINTENANCE.  Physical 12/17/13.   GI as outlined.  Pelvics, paps and breast exams have been done through Dr Arsenio Katz.  Up to date.  Mammogram 11/14 - negative.  Is scheduled for f/u mammogram.

## 2014-06-20 NOTE — Progress Notes (Signed)
Pre visit review using our clinic review tool, if applicable. No additional management support is needed unless otherwise documented below in the visit note. 

## 2014-06-23 ENCOUNTER — Telehealth: Payer: Self-pay | Admitting: Internal Medicine

## 2014-06-23 ENCOUNTER — Encounter: Payer: Self-pay | Admitting: Internal Medicine

## 2014-06-23 DIAGNOSIS — R7989 Other specified abnormal findings of blood chemistry: Secondary | ICD-10-CM

## 2014-06-23 NOTE — Assessment & Plan Note (Addendum)
Low cholesterol diet and exercise.  Continue lipitor.  Follow lipid panel and liver function.  Cholesterol checked 06/17/14 revealed total cholesterol 151, triglycerides 144, HDL 72 and LDL 51.

## 2014-06-23 NOTE — Assessment & Plan Note (Signed)
Bowels stable.  Colonoscopy as outlined.  Sees Dr Candace Cruise.

## 2014-06-23 NOTE — Telephone Encounter (Signed)
I had notified pt of lab results, but forgot to inform her of slightly elevated tsh. I would like to recheck tsh in 4 weeks.  Please notify pt and schedule f/u non fasting lab.

## 2014-06-23 NOTE — Assessment & Plan Note (Signed)
On Protonix in the am and Zantac in the evening.  Symptoms controlled.  Follow.  EGD 02/21/12 revealed hiatal hernia otherwise normal.

## 2014-06-23 NOTE — Assessment & Plan Note (Signed)
Blood pressure as outlined.  Same meds.  Follow.  Follow metabolic panel.

## 2014-06-23 NOTE — Assessment & Plan Note (Addendum)
Low carb diet.  Follow.  a1c just checked 06/17/14 - 5.5.

## 2014-06-23 NOTE — Assessment & Plan Note (Signed)
Improved.  Using support hose and pumps.  Follow.

## 2014-06-24 ENCOUNTER — Encounter: Payer: Self-pay | Admitting: *Deleted

## 2014-06-24 DIAGNOSIS — I6529 Occlusion and stenosis of unspecified carotid artery: Secondary | ICD-10-CM | POA: Diagnosis not present

## 2014-06-24 NOTE — Telephone Encounter (Signed)
Letter mailed

## 2014-07-03 ENCOUNTER — Telehealth: Payer: Self-pay | Admitting: Internal Medicine

## 2014-07-03 NOTE — Telephone Encounter (Signed)
Opened in error

## 2014-07-10 ENCOUNTER — Encounter: Payer: Self-pay | Admitting: Internal Medicine

## 2014-07-10 ENCOUNTER — Telehealth: Payer: Self-pay | Admitting: Internal Medicine

## 2014-07-10 NOTE — Telephone Encounter (Signed)
Notify pt that her carotid ultrasound reveals no significant blockage.

## 2014-07-11 ENCOUNTER — Encounter: Payer: Self-pay | Admitting: *Deleted

## 2014-07-11 NOTE — Telephone Encounter (Signed)
Letter mailed

## 2014-07-22 ENCOUNTER — Other Ambulatory Visit (INDEPENDENT_AMBULATORY_CARE_PROVIDER_SITE_OTHER): Payer: Medicare Other

## 2014-07-22 DIAGNOSIS — R946 Abnormal results of thyroid function studies: Secondary | ICD-10-CM

## 2014-07-22 DIAGNOSIS — R7989 Other specified abnormal findings of blood chemistry: Secondary | ICD-10-CM

## 2014-07-22 LAB — TSH: TSH: 3.62 u[IU]/mL (ref 0.35–4.50)

## 2014-07-23 ENCOUNTER — Encounter: Payer: Self-pay | Admitting: *Deleted

## 2014-08-07 ENCOUNTER — Encounter: Payer: Self-pay | Admitting: Internal Medicine

## 2014-08-09 DIAGNOSIS — Z1231 Encounter for screening mammogram for malignant neoplasm of breast: Secondary | ICD-10-CM | POA: Diagnosis not present

## 2014-08-09 DIAGNOSIS — Z79890 Hormone replacement therapy (postmenopausal): Secondary | ICD-10-CM | POA: Diagnosis not present

## 2014-08-09 DIAGNOSIS — R102 Pelvic and perineal pain: Secondary | ICD-10-CM | POA: Insufficient documentation

## 2014-08-09 DIAGNOSIS — Z780 Asymptomatic menopausal state: Secondary | ICD-10-CM | POA: Insufficient documentation

## 2014-08-09 DIAGNOSIS — Z0000 Encounter for general adult medical examination without abnormal findings: Secondary | ICD-10-CM | POA: Diagnosis not present

## 2014-08-09 DIAGNOSIS — N94810 Vulvar vestibulitis: Secondary | ICD-10-CM | POA: Diagnosis not present

## 2014-08-09 LAB — HM MAMMOGRAPHY: HM MAMMO: NEGATIVE

## 2014-08-17 ENCOUNTER — Other Ambulatory Visit: Payer: Self-pay | Admitting: Internal Medicine

## 2014-08-23 ENCOUNTER — Encounter: Payer: Self-pay | Admitting: Internal Medicine

## 2014-09-20 DIAGNOSIS — R221 Localized swelling, mass and lump, neck: Secondary | ICD-10-CM

## 2014-09-20 HISTORY — DX: Localized swelling, mass and lump, neck: R22.1

## 2014-10-01 DIAGNOSIS — M7989 Other specified soft tissue disorders: Secondary | ICD-10-CM | POA: Diagnosis not present

## 2014-10-01 DIAGNOSIS — I890 Lymphedema, not elsewhere classified: Secondary | ICD-10-CM | POA: Diagnosis not present

## 2014-10-15 ENCOUNTER — Other Ambulatory Visit: Payer: Self-pay | Admitting: Internal Medicine

## 2014-10-24 ENCOUNTER — Other Ambulatory Visit: Payer: Self-pay | Admitting: Internal Medicine

## 2014-10-25 ENCOUNTER — Other Ambulatory Visit: Payer: Self-pay

## 2014-10-25 NOTE — Telephone Encounter (Signed)
The patient called and needs a refill of norvasc  pharmacy [facility] [phone]

## 2014-10-25 NOTE — Telephone Encounter (Signed)
Pt notified this was refilled yesterday.

## 2014-11-06 DIAGNOSIS — Z780 Asymptomatic menopausal state: Secondary | ICD-10-CM | POA: Diagnosis not present

## 2014-12-20 ENCOUNTER — Other Ambulatory Visit (INDEPENDENT_AMBULATORY_CARE_PROVIDER_SITE_OTHER): Payer: Medicare Other

## 2014-12-20 DIAGNOSIS — R946 Abnormal results of thyroid function studies: Secondary | ICD-10-CM | POA: Diagnosis not present

## 2014-12-20 DIAGNOSIS — R7989 Other specified abnormal findings of blood chemistry: Secondary | ICD-10-CM

## 2014-12-20 LAB — TSH: TSH: 10.35 u[IU]/mL — ABNORMAL HIGH (ref 0.35–4.50)

## 2014-12-21 ENCOUNTER — Telehealth: Payer: Self-pay | Admitting: Internal Medicine

## 2014-12-21 ENCOUNTER — Other Ambulatory Visit: Payer: Self-pay | Admitting: Internal Medicine

## 2014-12-21 DIAGNOSIS — E7800 Pure hypercholesterolemia, unspecified: Secondary | ICD-10-CM

## 2014-12-21 DIAGNOSIS — I10 Essential (primary) hypertension: Secondary | ICD-10-CM

## 2014-12-21 DIAGNOSIS — R739 Hyperglycemia, unspecified: Secondary | ICD-10-CM

## 2014-12-21 DIAGNOSIS — R7989 Other specified abnormal findings of blood chemistry: Secondary | ICD-10-CM

## 2014-12-21 DIAGNOSIS — R945 Abnormal results of liver function studies: Secondary | ICD-10-CM

## 2014-12-21 NOTE — Telephone Encounter (Signed)
Please ignore the phone message on Elizabeth Henderson - about follow up labs in 3 weeks.  Wrong pt.  Thanks

## 2014-12-21 NOTE — Progress Notes (Signed)
Orders placed for labs

## 2014-12-21 NOTE — Telephone Encounter (Signed)
Pt was notified of labs via my chart.  Needs a f/u non fasting lab appt in 3 weeks.  Please notify the patient of the appointment date and time.  Thanks.

## 2014-12-23 ENCOUNTER — Encounter: Payer: Self-pay | Admitting: Internal Medicine

## 2014-12-23 ENCOUNTER — Ambulatory Visit (INDEPENDENT_AMBULATORY_CARE_PROVIDER_SITE_OTHER): Payer: Medicare Other | Admitting: Internal Medicine

## 2014-12-23 VITALS — BP 120/70 | HR 70 | Temp 98.0°F | Ht 62.0 in | Wt 156.5 lb

## 2014-12-23 DIAGNOSIS — K219 Gastro-esophageal reflux disease without esophagitis: Secondary | ICD-10-CM

## 2014-12-23 DIAGNOSIS — R7989 Other specified abnormal findings of blood chemistry: Secondary | ICD-10-CM | POA: Diagnosis not present

## 2014-12-23 DIAGNOSIS — E7800 Pure hypercholesterolemia, unspecified: Secondary | ICD-10-CM

## 2014-12-23 DIAGNOSIS — R739 Hyperglycemia, unspecified: Secondary | ICD-10-CM | POA: Diagnosis not present

## 2014-12-23 DIAGNOSIS — R946 Abnormal results of thyroid function studies: Secondary | ICD-10-CM | POA: Diagnosis not present

## 2014-12-23 DIAGNOSIS — R945 Abnormal results of liver function studies: Secondary | ICD-10-CM

## 2014-12-23 DIAGNOSIS — I10 Essential (primary) hypertension: Secondary | ICD-10-CM | POA: Diagnosis not present

## 2014-12-23 DIAGNOSIS — R0989 Other specified symptoms and signs involving the circulatory and respiratory systems: Secondary | ICD-10-CM

## 2014-12-23 DIAGNOSIS — K589 Irritable bowel syndrome without diarrhea: Secondary | ICD-10-CM

## 2014-12-23 DIAGNOSIS — E039 Hypothyroidism, unspecified: Secondary | ICD-10-CM

## 2014-12-23 DIAGNOSIS — Z0000 Encounter for general adult medical examination without abnormal findings: Secondary | ICD-10-CM

## 2014-12-23 DIAGNOSIS — M7989 Other specified soft tissue disorders: Secondary | ICD-10-CM

## 2014-12-23 LAB — CBC WITH DIFFERENTIAL/PLATELET
Basophils Absolute: 0.1 K/uL (ref 0.0–0.1)
Basophils Relative: 0.8 % (ref 0.0–3.0)
Eosinophils Absolute: 0.2 K/uL (ref 0.0–0.7)
Eosinophils Relative: 3.3 % (ref 0.0–5.0)
HCT: 38.3 % (ref 36.0–46.0)
Hemoglobin: 13.1 g/dL (ref 12.0–15.0)
Lymphocytes Relative: 18.1 % (ref 12.0–46.0)
Lymphs Abs: 1.3 K/uL (ref 0.7–4.0)
MCHC: 34.3 g/dL (ref 30.0–36.0)
MCV: 91.5 fl (ref 78.0–100.0)
Monocytes Absolute: 0.5 K/uL (ref 0.1–1.0)
Monocytes Relative: 6.5 % (ref 3.0–12.0)
Neutro Abs: 5 K/uL (ref 1.4–7.7)
Neutrophils Relative %: 71.3 % (ref 43.0–77.0)
Platelets: 208 K/uL (ref 150.0–400.0)
RBC: 4.18 Mil/uL (ref 3.87–5.11)
RDW: 12.7 % (ref 11.5–15.5)
WBC: 7.1 K/uL (ref 4.0–10.5)

## 2014-12-23 LAB — LIPID PANEL
Cholesterol: 150 mg/dL (ref 0–200)
HDL: 66.3 mg/dL
LDL Cholesterol: 50 mg/dL (ref 0–99)
NonHDL: 83.7
Total CHOL/HDL Ratio: 2
Triglycerides: 170 mg/dL — ABNORMAL HIGH (ref 0.0–149.0)
VLDL: 34 mg/dL (ref 0.0–40.0)

## 2014-12-23 LAB — HEPATIC FUNCTION PANEL
ALT: 14 U/L (ref 0–35)
AST: 20 U/L (ref 0–37)
Albumin: 3.8 g/dL (ref 3.5–5.2)
Alkaline Phosphatase: 84 U/L (ref 39–117)
Bilirubin, Direct: 0.1 mg/dL (ref 0.0–0.3)
TOTAL PROTEIN: 7.3 g/dL (ref 6.0–8.3)
Total Bilirubin: 0.5 mg/dL (ref 0.2–1.2)

## 2014-12-23 LAB — BASIC METABOLIC PANEL WITH GFR
BUN: 18 mg/dL (ref 6–23)
CO2: 27 meq/L (ref 19–32)
Calcium: 9.4 mg/dL (ref 8.4–10.5)
Chloride: 102 meq/L (ref 96–112)
Creatinine, Ser: 1.2 mg/dL (ref 0.40–1.20)
GFR: 45.89 mL/min — ABNORMAL LOW
Glucose, Bld: 97 mg/dL (ref 70–99)
Potassium: 4.9 meq/L (ref 3.5–5.1)
Sodium: 134 meq/L — ABNORMAL LOW (ref 135–145)

## 2014-12-23 LAB — HEMOGLOBIN A1C: Hgb A1c MFr Bld: 5.7 % (ref 4.6–6.5)

## 2014-12-23 MED ORDER — LEVOTHYROXINE SODIUM 50 MCG PO TABS: 50.0000 ug | ORAL_TABLET | Freq: Every day | ORAL | Status: DC

## 2014-12-23 NOTE — Progress Notes (Signed)
[name], female   DOB: 1932/05/24, 81 y.o.   MRN: [id number]   Subjective:    [name], female    DOB: May 31, 1932, 85 y.o.   MRN: [id number]  HPI  Patient here for a physical exam.  She states she is doing relatively well.  Recent tsh elevated.  Discussed with her today.  Discussed starting synthroid.  No cardiac symptoms with increased activity or exertion.  Breathing stable.  No acid reflux.  Bowels stable.  No increased leg swelling.  Up to date with gyn exams.  Saw Dr Howell Rucks.  States pelvic and pap ok.  Was evaluated 07/2014.     Past Medical History  Diagnosis Date  . Hypercholesterolemia   . Hypertension   . IBS (irritable bowel syndrome)   . GERD (gastroesophageal reflux disease)   . Headache(784.0)     h/o recurrent vascular headaches  . Diverticulosis   . Urinary tract bacterial infections     Current Outpatient Prescriptions on File Prior to Visit  Medication Sig Dispense Refill  . amitriptyline (ELAVIL) 25 MG tablet Take 25 mg by mouth at bedtime.    Marland Kitchen amLODipine (NORVASC) 10 MG tablet TAKE 1 TABLET DAILY 90 tablet 1  . aspirin 81 MG tablet Take 81 mg by mouth daily.    Marland Kitchen atorvastatin (LIPITOR) 40 MG tablet TAKE 1 TABLET DAILY 90 tablet 3  . estrogens, conjugated, (PREMARIN) 0.9 MG tablet Take 0.9 mg by mouth daily.     . metoprolol succinate (TOPROL-XL) 50 MG 24 hr tablet TAKE 1 TABLET TWICE A DAY  WITH OR IMMEDIATELY        FOLLOWING A MEAL 180 tablet 3  . pantoprazole (PROTONIX) 40 MG tablet TAKE 1 TABLET DAILY     No current facility-administered medications on file prior to visit.    Review of Systems  Constitutional: Negative for appetite change and unexpected weight change.  HENT: Negative for congestion and sinus pressure.   Eyes: Negative for pain and visual disturbance.  Respiratory: Negative for cough, chest tightness and shortness of breath.   Cardiovascular: Positive for leg swelling (improved.  ). Negative for  chest pain and palpitations.  Gastrointestinal: Negative for nausea, vomiting, abdominal pain and diarrhea.  Genitourinary: Negative for dysuria and difficulty urinating.  Musculoskeletal: Negative for joint swelling.  Skin: Negative for color change and rash.  Neurological: Negative for dizziness, light-headedness and headaches.  Hematological: Negative for adenopathy. Does not bruise/bleed easily.  Psychiatric/Behavioral: Negative for dysphoric mood and agitation.       Objective:    Physical Exam  Constitutional: She is oriented to person, place, and time. She appears well-developed and well-nourished.  HENT:  Nose: Nose normal.  Mouth/Throat: Oropharynx is clear and moist.  Eyes: Right eye exhibits no discharge. Left eye exhibits no discharge. No scleral icterus.  Neck: Neck supple. No thyromegaly present.  Cardiovascular: Normal rate and regular rhythm.   Pulmonary/Chest: Breath sounds normal. No accessory muscle usage. No tachypnea. No respiratory distress. She has no decreased breath sounds. She has no wheezes. She has no rhonchi. Right breast exhibits no inverted nipple, no mass, no nipple discharge and no tenderness (no axillary adenopathy). Left breast exhibits no inverted nipple, no mass, no nipple discharge and no tenderness (no axilarry adenopathy).  Abdominal: Soft. Bowel sounds are normal. There is no tenderness.  Musculoskeletal: She exhibits no edema or tenderness.  Lymphadenopathy:    She has no cervical adenopathy.  Neurological: She is alert  and oriented to person, place, and time.  Skin: Skin is warm. No rash noted.  Psychiatric: She has a normal mood and affect. Her behavior is normal.    BP 120/70 mmHg  Pulse 70  Temp(Src) 98 F (36.7 C) (Oral)  Ht _0  (1.575 m)  Wt 156 lb 8 oz (70.988 kg)  BMI 28.62 kg/m2  SpO2 96% Wt Readings from Last 3 Encounters:  12/23/14 156 lb 8 oz (70.988 kg)  06/20/14 158 lb 8 oz (71.895 kg)  12/17/13 163 lb 4 oz (74.05 kg)      Lab Results  Component Value Date   WBC 7.1 12/23/2014   HGB 13.1 12/23/2014   HCT 38.3 12/23/2014   PLT 208.0 12/23/2014   GLUCOSE 97 12/23/2014   CHOL 150 12/23/2014   TRIG 170.0* 12/23/2014   HDL 66.30 12/23/2014   LDLDIRECT 64.9 12/08/2012   LDLCALC 50 12/23/2014   ALT 14 12/23/2014   AST 20 12/23/2014   NA 134* 12/23/2014   K 4.9 12/23/2014   CL 102 12/23/2014   CREATININE 1.20 12/23/2014   BUN 18 12/23/2014   CO2 27 12/23/2014   TSH 10.35* 12/20/2014   HGBA1C 5.7 12/23/2014        Assessment & Plan:   Problem List Items Addressed This Visit    GERD (gastroesophageal reflux disease) - Primary    On protonix and zantac,  Symptoms controlled.  EGD 02/2012 - hiatal hernia, otherwise normal.        Health care maintenance    Physical today.  Colonoscopy 02/21/12 - diverticulosis, otherwise normal.  Mammogram 08/09/14 - negative.        Hypercholesterolemia    On atorvastatin.  Follow lipid panel and liver function tests.  Low cholesterol diet and exercise.        Hyperglycemia    Low carb diet and exercise.  Follow met b and a1c.       Hypertension    Blood pressure has been under good control.  Continue same medication regimen.  Follow pressures.  Follow metabolic panel.        Hypothyroidism    Start synthroid 35mg q day.  Recheck tsh in 6 weeks.        Relevant Medications   levothyroxine (SYNTHROID, LEVOTHROID) tablet   IBS (irritable bowel syndrome)    Bowels stable.  Colonoscopy 02/21/12 - diverticulosis, otherwise normal.       Leg swelling    Uses the support hose and pumps.  Follow.  Better.        Right carotid bruit    Carotid ultrasound 06/24/14 - carotid ultrasound revealed no hemodynamically significant stenosis.  Tortuous right subclavian artery.  Recommend f/u carotid ultrasound in one year.         Other Visit Diagnoses    Abnormal liver function tests        Elevated TSH          I spent 25 minutes with the patient and  more than 50% of the time was spent in consultation regarding the above.     SEinar Pheasant MD

## 2014-12-23 NOTE — Progress Notes (Signed)
Pre visit review using our clinic review tool, if applicable. No additional management support is needed unless otherwise documented below in the visit note. 

## 2014-12-24 ENCOUNTER — Other Ambulatory Visit: Payer: Self-pay | Admitting: Internal Medicine

## 2014-12-24 ENCOUNTER — Encounter: Payer: Self-pay | Admitting: *Deleted

## 2014-12-24 DIAGNOSIS — E871 Hypo-osmolality and hyponatremia: Secondary | ICD-10-CM

## 2014-12-24 NOTE — Progress Notes (Signed)
Order placed for f/u sodium.  ?

## 2014-12-28 DIAGNOSIS — E039 Hypothyroidism, unspecified: Secondary | ICD-10-CM | POA: Insufficient documentation

## 2014-12-28 DIAGNOSIS — Z0000 Encounter for general adult medical examination without abnormal findings: Secondary | ICD-10-CM | POA: Insufficient documentation

## 2014-12-28 NOTE — Assessment & Plan Note (Signed)
Uses the support hose and pumps.  Follow.  Better.

## 2014-12-28 NOTE — Assessment & Plan Note (Signed)
Physical today.  Colonoscopy 02/21/12 - diverticulosis, otherwise normal.  Mammogram 08/09/14 - negative.

## 2014-12-28 NOTE — Assessment & Plan Note (Signed)
Bowels stable.  Colonoscopy 02/21/12 - diverticulosis, otherwise normal.

## 2014-12-28 NOTE — Assessment & Plan Note (Signed)
Blood pressure has been under good control.  Continue same medication regimen.  Follow pressures.  Follow metabolic panel.   

## 2014-12-28 NOTE — Assessment & Plan Note (Signed)
Carotid ultrasound 06/24/14 - carotid ultrasound revealed no hemodynamically significant stenosis.  Tortuous right subclavian artery.  Recommend f/u carotid ultrasound in one year.

## 2014-12-28 NOTE — Assessment & Plan Note (Signed)
Low carb diet and exercise.  Follow met b and a1c.  

## 2014-12-28 NOTE — Assessment & Plan Note (Signed)
Start synthroid 30mcg q day.  Recheck tsh in 6 weeks.

## 2014-12-28 NOTE — Assessment & Plan Note (Signed)
On protonix and zantac,  Symptoms controlled.  EGD 02/2012 - hiatal hernia, otherwise normal.

## 2014-12-28 NOTE — Assessment & Plan Note (Signed)
On atorvastatin.  Follow lipid panel and liver function tests.  Low cholesterol diet and exercise.

## 2015-01-13 ENCOUNTER — Other Ambulatory Visit: Payer: PRIVATE HEALTH INSURANCE

## 2015-01-14 ENCOUNTER — Other Ambulatory Visit (INDEPENDENT_AMBULATORY_CARE_PROVIDER_SITE_OTHER): Payer: Medicare Other

## 2015-01-14 DIAGNOSIS — E871 Hypo-osmolality and hyponatremia: Secondary | ICD-10-CM | POA: Diagnosis not present

## 2015-01-14 LAB — SODIUM: Sodium: 135 mEq/L (ref 135–145)

## 2015-01-15 ENCOUNTER — Encounter: Payer: Self-pay | Admitting: *Deleted

## 2015-01-27 ENCOUNTER — Telehealth: Payer: Self-pay | Admitting: *Deleted

## 2015-01-27 NOTE — Telephone Encounter (Signed)
Pt left VM from 01/05/15, with a Rx question. Called her back, it was regarding her levothyroxine, she was needing a refill, has already resolved. Has recheck lab appt 02/03/15

## 2015-02-03 ENCOUNTER — Other Ambulatory Visit (INDEPENDENT_AMBULATORY_CARE_PROVIDER_SITE_OTHER): Payer: Medicare Other

## 2015-02-03 ENCOUNTER — Telehealth: Payer: Self-pay | Admitting: *Deleted

## 2015-02-03 DIAGNOSIS — I10 Essential (primary) hypertension: Secondary | ICD-10-CM

## 2015-02-03 DIAGNOSIS — E039 Hypothyroidism, unspecified: Secondary | ICD-10-CM | POA: Diagnosis not present

## 2015-02-03 LAB — BASIC METABOLIC PANEL
BUN: 17 mg/dL (ref 6–23)
CHLORIDE: 102 meq/L (ref 96–112)
CO2: 29 mEq/L (ref 19–32)
Calcium: 9.2 mg/dL (ref 8.4–10.5)
Creatinine, Ser: 1.12 mg/dL (ref 0.40–1.20)
GFR: 49.68 mL/min — ABNORMAL LOW (ref >60.00)
GLUCOSE: 100 mg/dL — AB (ref 70–99)
Potassium: 4.5 mEq/L (ref 3.5–5.1)
Sodium: 137 mEq/L (ref 135–145)

## 2015-02-03 LAB — TSH: TSH: 2.72 u[IU]/mL (ref 0.35–4.50)

## 2015-02-03 NOTE — Telephone Encounter (Signed)
Labs and dx?  

## 2015-02-03 NOTE — Telephone Encounter (Signed)
Orders placed for labs

## 2015-02-04 ENCOUNTER — Encounter: Payer: Self-pay | Admitting: *Deleted

## 2015-03-11 ENCOUNTER — Other Ambulatory Visit: Payer: Self-pay | Admitting: *Deleted

## 2015-03-11 ENCOUNTER — Telehealth: Payer: Self-pay | Admitting: Internal Medicine

## 2015-03-11 MED ORDER — LEVOTHYROXINE SODIUM 50 MCG PO TABS: 50.0000 ug | ORAL_TABLET | Freq: Every day | ORAL | Status: DC

## 2015-03-11 NOTE — Telephone Encounter (Signed)
rx sent

## 2015-03-11 NOTE — Telephone Encounter (Signed)
Pt request refill on Synthroid 90 day supply. Please advise pt/msn

## 2015-03-12 DIAGNOSIS — H2513 Age-related nuclear cataract, bilateral: Secondary | ICD-10-CM | POA: Diagnosis not present

## 2015-04-21 ENCOUNTER — Other Ambulatory Visit (INDEPENDENT_AMBULATORY_CARE_PROVIDER_SITE_OTHER): Payer: Medicare Other

## 2015-04-21 ENCOUNTER — Telehealth: Payer: Self-pay | Admitting: *Deleted

## 2015-04-21 DIAGNOSIS — E7800 Pure hypercholesterolemia, unspecified: Secondary | ICD-10-CM

## 2015-04-21 DIAGNOSIS — R739 Hyperglycemia, unspecified: Secondary | ICD-10-CM

## 2015-04-21 DIAGNOSIS — E039 Hypothyroidism, unspecified: Secondary | ICD-10-CM | POA: Diagnosis not present

## 2015-04-21 DIAGNOSIS — I10 Essential (primary) hypertension: Secondary | ICD-10-CM

## 2015-04-21 LAB — HEPATIC FUNCTION PANEL
ALK PHOS: 87 U/L (ref 39–117)
ALT: 10 U/L (ref 0–35)
AST: 16 U/L (ref 0–37)
Albumin: 3.8 g/dL (ref 3.5–5.2)
BILIRUBIN DIRECT: 0.1 mg/dL (ref 0.0–0.3)
BILIRUBIN TOTAL: 0.4 mg/dL (ref 0.2–1.2)
TOTAL PROTEIN: 6.7 g/dL (ref 6.0–8.3)

## 2015-04-21 LAB — LIPID PANEL
CHOLESTEROL: 154 mg/dL (ref 0–200)
HDL: 73.4 mg/dL (ref >39.00)
LDL CALC: 42 mg/dL (ref 0–99)
NONHDL: 80.22
Total CHOL/HDL Ratio: 2
Triglycerides: 191 mg/dL — ABNORMAL HIGH (ref 0.0–149.0)
VLDL: 38.2 mg/dL (ref 0.0–40.0)

## 2015-04-21 LAB — BASIC METABOLIC PANEL
BUN: 16 mg/dL (ref 6–23)
CO2: 26 mEq/L (ref 19–32)
Calcium: 8.9 mg/dL (ref 8.4–10.5)
Chloride: 102 mEq/L (ref 96–112)
Creatinine, Ser: 1.07 mg/dL (ref 0.40–1.20)
GFR: 52.34 mL/min — ABNORMAL LOW (ref >60.00)
Glucose, Bld: 97 mg/dL (ref 70–99)
POTASSIUM: 4.5 meq/L (ref 3.5–5.1)
SODIUM: 139 meq/L (ref 135–145)

## 2015-04-21 LAB — TSH: TSH: 3.23 u[IU]/mL (ref 0.35–4.50)

## 2015-04-21 LAB — HEMOGLOBIN A1C: Hgb A1c MFr Bld: 5.5 % (ref 4.6–6.5)

## 2015-04-21 NOTE — Telephone Encounter (Signed)
Orders placed for labs

## 2015-04-21 NOTE — Telephone Encounter (Signed)
Labs and dx?  

## 2015-04-24 ENCOUNTER — Ambulatory Visit (INDEPENDENT_AMBULATORY_CARE_PROVIDER_SITE_OTHER): Payer: Medicare Other | Admitting: Internal Medicine

## 2015-04-24 ENCOUNTER — Encounter: Payer: Self-pay | Admitting: Internal Medicine

## 2015-04-24 VITALS — BP 120/60 | HR 65 | Temp 97.9°F | Ht 62.0 in | Wt 161.2 lb

## 2015-04-24 DIAGNOSIS — M7989 Other specified soft tissue disorders: Secondary | ICD-10-CM

## 2015-04-24 DIAGNOSIS — K589 Irritable bowel syndrome without diarrhea: Secondary | ICD-10-CM | POA: Diagnosis not present

## 2015-04-24 DIAGNOSIS — E039 Hypothyroidism, unspecified: Secondary | ICD-10-CM | POA: Diagnosis not present

## 2015-04-24 DIAGNOSIS — Z0000 Encounter for general adult medical examination without abnormal findings: Secondary | ICD-10-CM

## 2015-04-24 DIAGNOSIS — R739 Hyperglycemia, unspecified: Secondary | ICD-10-CM

## 2015-04-24 DIAGNOSIS — I10 Essential (primary) hypertension: Secondary | ICD-10-CM | POA: Diagnosis not present

## 2015-04-24 DIAGNOSIS — K219 Gastro-esophageal reflux disease without esophagitis: Secondary | ICD-10-CM | POA: Diagnosis not present

## 2015-04-24 DIAGNOSIS — E7800 Pure hypercholesterolemia, unspecified: Secondary | ICD-10-CM

## 2015-04-24 DIAGNOSIS — R0989 Other specified symptoms and signs involving the circulatory and respiratory systems: Secondary | ICD-10-CM

## 2015-04-24 NOTE — Progress Notes (Signed)
[name], female   DOB: 05-03-1932, 81 y.o.   MRN: [id number]   Subjective:    [name], female    DOB: April 08, 1932, 81 y.o.   MRN: [id number]  HPI  Patient here for a scheduled follow up.  She is trying to stay active.  Does rest intermittently - to elevate her legs.  No cardiac symptoms with increased activity or exertion.  No sob.  No acid reflux.  Bowels stable.  Sees gyn for her pelvic exams.  Gets her mammogram in the fall.     Past Medical History  Diagnosis Date  . Hypercholesterolemia   . Hypertension   . IBS (irritable bowel syndrome)   . GERD (gastroesophageal reflux disease)   . Headache(784.0)     h/o recurrent vascular headaches  . Diverticulosis   . Urinary tract bacterial infections     Outpatient Encounter Prescriptions as of 04/24/2015  Medication Sig  . amitriptyline (ELAVIL) 25 MG tablet Take 25 mg by mouth at bedtime.  Marland Kitchen amLODipine (NORVASC) 10 MG tablet TAKE 1 TABLET DAILY  . aspirin 81 MG tablet Take 81 mg by mouth daily.  Marland Kitchen atorvastatin (LIPITOR) 40 MG tablet TAKE 1 TABLET DAILY  . estrogens, conjugated, (PREMARIN) 0.9 MG tablet Take 0.9 mg by mouth daily.   Marland Kitchen levothyroxine (SYNTHROID) 50 MCG tablet Take 1 tablet (50 mcg total) by mouth daily before breakfast.  . metoprolol succinate (TOPROL-XL) 50 MG 24 hr tablet TAKE 1 TABLET TWICE A DAY  WITH OR IMMEDIATELY        FOLLOWING A MEAL  . pantoprazole (PROTONIX) 40 MG tablet TAKE 1 TABLET DAILY   No facility-administered encounter medications on file as of 04/24/2015.    Review of Systems  Constitutional: Negative for appetite change and unexpected weight change.  HENT: Negative for congestion and sinus pressure.   Respiratory: Negative for cough, chest tightness and shortness of breath.   Cardiovascular: Negative for chest pain, palpitations and leg swelling (swelling is doing better.  uses her pump prn. ).  Gastrointestinal: Negative for nausea, vomiting, abdominal pain  and diarrhea.  Genitourinary: Negative for dysuria and difficulty urinating.  Skin: Negative for color change and rash.  Neurological: Negative for dizziness, light-headedness and headaches.  Psychiatric/Behavioral: Negative for dysphoric mood and agitation.       Objective:     Blood pressure recheck:  122/68  Physical Exam  Constitutional: She appears well-developed and well-nourished. No distress.  HENT:  Nose: Nose normal.  Mouth/Throat: Oropharynx is clear and moist.  Neck: Neck supple. No thyromegaly present.  Cardiovascular: Normal rate and regular rhythm.   Pulmonary/Chest: Breath sounds normal. No respiratory distress. She has no wheezes.  Abdominal: Soft. Bowel sounds are normal. There is no tenderness.  Musculoskeletal: She exhibits no edema or tenderness.  Lymphadenopathy:    She has no cervical adenopathy.  Skin: No rash noted. No erythema.  Psychiatric: She has a normal mood and affect. Her behavior is normal.    BP 120/60 mmHg  Pulse 65  Temp(Src) 97.9 F (36.6 C) (Oral)  Ht 5' 2" (1.575 m)  Wt 161 lb 4 oz (73.143 kg)  BMI 29.49 kg/m2  SpO2 97% Wt Readings from Last 3 Encounters:  04/24/15 161 lb 4 oz (73.143 kg)  12/23/14 156 lb 8 oz (70.988 kg)  06/20/14 158 lb 8 oz (71.895 kg)     Lab Results  Component Value Date   WBC 7.1 12/23/2014   HGB 13.1  12/23/2014   HCT 38.3 12/23/2014   PLT 208.0 12/23/2014   GLUCOSE 97 04/21/2015   CHOL 154 04/21/2015   TRIG 191.0* 04/21/2015   HDL 73.40 04/21/2015   LDLDIRECT 64.9 12/08/2012   LDLCALC 42 04/21/2015   ALT 10 04/21/2015   AST 16 04/21/2015   NA 139 04/21/2015   K 4.5 04/21/2015   CL 102 04/21/2015   CREATININE 1.07 04/21/2015   BUN 16 04/21/2015   CO2 26 04/21/2015   TSH 3.23 04/21/2015   HGBA1C 5.5 04/21/2015       Assessment & Plan:   Problem List Items Addressed This Visit    GERD (gastroesophageal reflux disease)    Controlled.  On protonix and xanax.  Had EGD 02/2012 - hiatal  hernia, otherwise normal.       Health care maintenance    Physical 12/23/14.  Colonoscopy 02/21/12.  Mammogram 08/09/14.        Hypercholesterolemia    Low cholesterol diet and exercise.  On lipitor.  Follow lipid panel and liver function tests.       Hyperglycemia    Low carb diet and exercise.  Follow met b and a1c.       Hypertension - Primary    Blood pressure under good control.  Continue same medication regimen.  Follow pressures.  Follow metabolic panel.        Hypothyroidism    On synthroid.  Follow tsh.       IBS (irritable bowel syndrome)    Colonoscopy 02/21/12 - diverticulosis, otherwise normal.  Bowels stable.       Leg swelling    Swelling better.  Using support hose and pumps.       Right carotid bruit    06/24/14 - carotid ultrasound revealed no hemodynamically significant stenosis.  Tortuous right subclavian artery.  Recommended f/u carotid ultrasound in one year.            Einar Pheasant, MD

## 2015-04-24 NOTE — Progress Notes (Signed)
Pre visit review using our clinic review tool, if applicable. No additional management support is needed unless otherwise documented below in the visit note. 

## 2015-04-26 ENCOUNTER — Encounter: Payer: Self-pay | Admitting: Internal Medicine

## 2015-04-26 NOTE — Assessment & Plan Note (Signed)
Colonoscopy 02/21/12 - diverticulosis, otherwise normal.  Bowels stable.

## 2015-04-26 NOTE — Assessment & Plan Note (Signed)
Blood pressure under good control.  Continue same medication regimen.  Follow pressures.  Follow metabolic panel.   

## 2015-04-26 NOTE — Assessment & Plan Note (Signed)
06/24/14 - carotid ultrasound revealed no hemodynamically significant stenosis.  Tortuous right subclavian artery.  Recommended f/u carotid ultrasound in one year.

## 2015-04-26 NOTE — Assessment & Plan Note (Signed)
On synthroid.  Follow tsh.   

## 2015-04-26 NOTE — Assessment & Plan Note (Addendum)
Swelling better.  Using support hose and pumps.

## 2015-04-26 NOTE — Assessment & Plan Note (Signed)
Low cholesterol diet and exercise.  On lipitor.  Follow lipid panel and liver function tests.   

## 2015-04-26 NOTE — Assessment & Plan Note (Signed)
Controlled.  On protonix and xanax.  Had EGD 02/2012 - hiatal hernia, otherwise normal.

## 2015-04-26 NOTE — Assessment & Plan Note (Signed)
Physical 12/23/14.  Colonoscopy 02/21/12.  Mammogram 08/09/14.

## 2015-04-26 NOTE — Assessment & Plan Note (Signed)
Low carb diet and exercise.  Follow met b and a1c.  

## 2015-05-20 ENCOUNTER — Other Ambulatory Visit: Payer: Self-pay | Admitting: Internal Medicine

## 2015-07-02 ENCOUNTER — Ambulatory Visit (INDEPENDENT_AMBULATORY_CARE_PROVIDER_SITE_OTHER): Payer: Medicare Other

## 2015-07-02 DIAGNOSIS — Z23 Encounter for immunization: Secondary | ICD-10-CM

## 2015-07-16 ENCOUNTER — Telehealth: Payer: Self-pay | Admitting: Internal Medicine

## 2015-07-16 NOTE — Telephone Encounter (Signed)
Pt called requesting to speak to Dr Nicki Reaper regarding getting a referral for a gastroenterologist. Pt will be home by 5pm today. Thank You!

## 2015-07-16 NOTE — Telephone Encounter (Signed)
Please advise 

## 2015-07-16 NOTE — Telephone Encounter (Signed)
I can place an order for a referral.  Need to know why she needs a referral.

## 2015-07-16 NOTE — Telephone Encounter (Signed)
Spoke with husband on the phone. Scheduled for Friday afternoon appointment with Dr. Nicki Reaper

## 2015-07-18 ENCOUNTER — Ambulatory Visit (INDEPENDENT_AMBULATORY_CARE_PROVIDER_SITE_OTHER): Payer: Medicare Other | Admitting: Internal Medicine

## 2015-07-18 ENCOUNTER — Encounter: Payer: Self-pay | Admitting: Internal Medicine

## 2015-07-18 VITALS — BP 104/60 | HR 63 | Temp 98.1°F | Resp 18 | Ht 62.0 in | Wt 159.0 lb

## 2015-07-18 DIAGNOSIS — R1310 Dysphagia, unspecified: Secondary | ICD-10-CM | POA: Diagnosis not present

## 2015-07-18 DIAGNOSIS — I10 Essential (primary) hypertension: Secondary | ICD-10-CM

## 2015-07-18 DIAGNOSIS — K219 Gastro-esophageal reflux disease without esophagitis: Secondary | ICD-10-CM | POA: Diagnosis not present

## 2015-07-18 DIAGNOSIS — R221 Localized swelling, mass and lump, neck: Secondary | ICD-10-CM | POA: Diagnosis not present

## 2015-07-18 DIAGNOSIS — E039 Hypothyroidism, unspecified: Secondary | ICD-10-CM

## 2015-07-18 NOTE — Progress Notes (Signed)
Pre-visit discussion using our clinic review tool. No additional management support is needed unless otherwise documented below in the visit note.  

## 2015-07-18 NOTE — Progress Notes (Signed)
[name], female   DOB: [birth year], 81 y.o.   MRN: [id number]   Subjective:    [name], female    DOB: Apr 27, 1932, 81 y.o.   MRN: [id number]  HPI  Patient with past history of hypercholesterolemia, hypertension and GERD.  She comes in today as a work in with concerns regarding some dysphagia and choking episode.  States that several days ago, she was eating cereal.  Has an area in her throat that when she swallows, will tickle and trigger a cough.  When eating her cereal, started with increased cough.  Progressed.  Felt like she could not get a good breath.  Episode may have lasted approximately two minutes.  No episode since.  Had an episode one year ago that was similar to her choking episode.  Takes medication for her acid reflux.  No abdomina pain or cramping.  No nausea or vomiting.  Bowels stable.     Past Medical History  Diagnosis Date  . Hypercholesterolemia   . Hypertension   . IBS (irritable bowel syndrome)   . GERD (gastroesophageal reflux disease)   . Headache(784.0)     h/o recurrent vascular headaches  . Diverticulosis   . Urinary tract bacterial infections    Past Surgical History  Procedure Laterality Date  . Appendectomy    . Partial hysterectomy    . Cystoscopy      x2  . Cholecystectomy  1990  . Tonsillectomy     Family History  Problem Relation Age of Onset  . Heart disease Father   . Heart disease Mother   . Breast cancer Neg Hx   . Colon cancer Neg Hx    Social History   Social History  . Marital Status: Married    Spouse Name: N/A  . Number of Children: 2  . Years of Education: N/A   Occupational History  . retired    Social History Main Topics  . Smoking status: Never Smoker   . Smokeless tobacco: Never Used  . Alcohol Use: No  . Drug Use: No  . Sexual Activity: Not Asked   Other Topics Concern  . None   Social History Narrative    Outpatient Encounter Prescriptions as of 07/18/2015  Medication  Sig  . amitriptyline (ELAVIL) 25 MG tablet Take 25 mg by mouth at bedtime.  Marland Kitchen amLODipine (NORVASC) 10 MG tablet TAKE 1 TABLET DAILY  . amLODipine (NORVASC) 10 MG tablet TAKE 1 TABLET DAILY  . aspirin 81 MG tablet Take 81 mg by mouth daily.  Marland Kitchen atorvastatin (LIPITOR) 40 MG tablet TAKE 1 TABLET DAILY  . estrogens, conjugated, (PREMARIN) 0.9 MG tablet Take 0.9 mg by mouth daily.   Marland Kitchen levothyroxine (SYNTHROID) 50 MCG tablet Take 1 tablet (50 mcg total) by mouth daily before breakfast.  . metoprolol succinate (TOPROL-XL) 50 MG 24 hr tablet TAKE 1 TABLET TWICE A DAY  WITH OR IMMEDIATELY        FOLLOWING A MEAL  . pantoprazole (PROTONIX) 40 MG tablet TAKE 1 TABLET DAILY   No facility-administered encounter medications on file as of 07/18/2015.    Review of Systems  Constitutional: Negative for appetite change and unexpected weight change.  HENT: Negative for congestion and sinus pressure.   Respiratory: Positive for cough (coughing episode as outlined.  ). Negative for chest tightness, shortness of breath and wheezing.   Cardiovascular: Negative for chest pain, palpitations and leg swelling.  Gastrointestinal: Negative for nausea, vomiting, abdominal pain  and diarrhea.       Still some occasional acid reflux.   Genitourinary: Negative for dysuria and difficulty urinating.  Musculoskeletal: Negative for back pain and joint swelling.  Skin: Negative for color change and rash.  Neurological: Negative for dizziness, light-headedness and headaches.  Psychiatric/Behavioral: Negative for dysphoric mood and agitation.       Objective:    Physical Exam  Constitutional: She appears well-developed and well-nourished. No distress.  HENT:  Nose: Nose normal.  Mouth/Throat: Oropharynx is clear and moist.  Eyes: Conjunctivae are normal. Right eye exhibits no discharge. Left eye exhibits no discharge.  Neck: Neck supple. No thyromegaly present.  Cardiovascular: Normal rate and regular rhythm.     Pulmonary/Chest: Breath sounds normal. No respiratory distress. She has no wheezes.  Abdominal: Soft. Bowel sounds are normal. There is no tenderness.  Musculoskeletal: She exhibits no edema or tenderness.  Lymphadenopathy:    She has no cervical adenopathy.  Skin: No rash noted. No erythema.  Psychiatric: She has a normal mood and affect. Her behavior is normal.    BP 104/60 mmHg  Pulse 63  Temp(Src) 98.1 F (36.7 C) (Oral)  Resp 18  Ht 5\' 2"  (1.575 m)  Wt 159 lb (72.122 kg)  BMI 29.07 kg/m2  SpO2 96% Wt Readings from Last 3 Encounters:  07/18/15 159 lb (72.122 kg)  04/24/15 161 lb 4 oz (73.143 kg)  12/23/14 156 lb 8 oz (70.988 kg)     Lab Results  Component Value Date   WBC 7.1 12/23/2014   HGB 13.1 12/23/2014   HCT 38.3 12/23/2014   PLT 208.0 12/23/2014   GLUCOSE 97 04/21/2015   CHOL 154 04/21/2015   TRIG 191.0* 04/21/2015   HDL 73.40 04/21/2015   LDLDIRECT 64.9 12/08/2012   LDLCALC 42 04/21/2015   ALT 10 04/21/2015   AST 16 04/21/2015   NA 139 04/21/2015   K 4.5 04/21/2015   CL 102 04/21/2015   CREATININE 1.07 04/21/2015   BUN 16 04/21/2015   CO2 26 04/21/2015   TSH 3.23 04/21/2015   HGBA1C 5.5 04/21/2015        Assessment & Plan:   Problem List Items Addressed This Visit    Dysphagia - Primary    Dysphagia with certain foods as outlined.  Continue protonix.  Refer to Gi as outlined.        Relevant Orders   Ambulatory referral to Gastroenterology   US Soft Tissue Head/Neck   GERD (gastroesophageal reflux disease)    On protonix.  Some acid reflux.  Has some dysphagia with certain foods.  Had the choking episode.  Refer to GI for further evaluation.   Instructed to chew food well, take small bites and eat slowly.        Hypertension    Blood pressure under good control.  Continue same medication regimen.  Follow pressures.  Follow metabolic panel.        Hypothyroidism    Some fullness in her neck.  Will check thyroid ultrasound.          Other Visit Diagnoses    Neck fullness        Relevant Orders    US Soft Tissue Head/Neck        Einar Pheasant, MD

## 2015-07-20 ENCOUNTER — Encounter: Payer: Self-pay | Admitting: Internal Medicine

## 2015-07-20 DIAGNOSIS — R1310 Dysphagia, unspecified: Secondary | ICD-10-CM | POA: Insufficient documentation

## 2015-07-20 NOTE — Assessment & Plan Note (Signed)
Some fullness in her neck.  Will check thyroid ultrasound.

## 2015-07-20 NOTE — Assessment & Plan Note (Signed)
Blood pressure under good control.  Continue same medication regimen.  Follow pressures.  Follow metabolic panel.   

## 2015-07-20 NOTE — Assessment & Plan Note (Signed)
Dysphagia with certain foods as outlined.  Continue protonix.  Refer to Gi as outlined.

## 2015-07-20 NOTE — Assessment & Plan Note (Signed)
On protonix.  Some acid reflux.  Has some dysphagia with certain foods.  Had the choking episode.  Refer to GI for further evaluation.   Instructed to chew food well, take small bites and eat slowly.

## 2015-07-25 ENCOUNTER — Ambulatory Visit
Admission: RE | Admit: 2015-07-25 | Discharge: 2015-07-25 | Disposition: A | Discharge status: Home / Self Care | Payer: Medicare Other | Source: Ambulatory Visit | Attending: Internal Medicine | Admitting: Internal Medicine

## 2015-07-25 DIAGNOSIS — E042 Nontoxic multinodular goiter: Secondary | ICD-10-CM | POA: Diagnosis not present

## 2015-07-25 DIAGNOSIS — R1310 Dysphagia, unspecified: Secondary | ICD-10-CM

## 2015-07-25 DIAGNOSIS — E041 Nontoxic single thyroid nodule: Secondary | ICD-10-CM | POA: Diagnosis not present

## 2015-07-25 DIAGNOSIS — R221 Localized swelling, mass and lump, neck: Secondary | ICD-10-CM

## 2015-07-30 DIAGNOSIS — R1319 Other dysphagia: Secondary | ICD-10-CM | POA: Diagnosis not present

## 2015-07-30 DIAGNOSIS — K219 Gastro-esophageal reflux disease without esophagitis: Secondary | ICD-10-CM | POA: Diagnosis not present

## 2015-08-11 ENCOUNTER — Other Ambulatory Visit: Payer: Self-pay | Admitting: Internal Medicine

## 2015-08-21 ENCOUNTER — Other Ambulatory Visit (INDEPENDENT_AMBULATORY_CARE_PROVIDER_SITE_OTHER): Payer: Medicare Other

## 2015-08-21 DIAGNOSIS — E7800 Pure hypercholesterolemia, unspecified: Secondary | ICD-10-CM

## 2015-08-21 DIAGNOSIS — E039 Hypothyroidism, unspecified: Secondary | ICD-10-CM | POA: Diagnosis not present

## 2015-08-21 DIAGNOSIS — I10 Essential (primary) hypertension: Secondary | ICD-10-CM | POA: Diagnosis not present

## 2015-08-21 DIAGNOSIS — R739 Hyperglycemia, unspecified: Secondary | ICD-10-CM

## 2015-08-21 LAB — HEMOGLOBIN A1C: Hgb A1c MFr Bld: 5.5 % (ref 4.6–6.5)

## 2015-08-21 LAB — HEPATIC FUNCTION PANEL
ALK PHOS: 85 U/L (ref 39–117)
ALT: 13 U/L (ref 0–35)
AST: 18 U/L (ref 0–37)
Albumin: 3.7 g/dL (ref 3.5–5.2)
BILIRUBIN DIRECT: 0.1 mg/dL (ref 0.0–0.3)
TOTAL PROTEIN: 7.2 g/dL (ref 6.0–8.3)
Total Bilirubin: 0.5 mg/dL (ref 0.2–1.2)

## 2015-08-21 LAB — BASIC METABOLIC PANEL
BUN: 15 mg/dL (ref 6–23)
CALCIUM: 9.4 mg/dL (ref 8.4–10.5)
CHLORIDE: 103 meq/L (ref 96–112)
CO2: 27 mEq/L (ref 19–32)
Creatinine, Ser: 1.17 mg/dL (ref 0.40–1.20)
GFR: 47.18 mL/min — AB (ref >60.00)
GLUCOSE: 99 mg/dL (ref 70–99)
POTASSIUM: 4.1 meq/L (ref 3.5–5.1)
SODIUM: 138 meq/L (ref 135–145)

## 2015-08-21 LAB — LIPID PANEL
Cholesterol: 152 mg/dL (ref 0–200)
HDL: 63.4 mg/dL (ref >39.00)
LDL CALC: 56 mg/dL (ref 0–99)
NONHDL: 88.73
TRIGLYCERIDES: 164 mg/dL — AB (ref 0.0–149.0)
Total CHOL/HDL Ratio: 2
VLDL: 32.8 mg/dL (ref 0.0–40.0)

## 2015-08-21 LAB — TSH: TSH: 2.78 u[IU]/mL (ref 0.35–4.50)

## 2015-08-22 DIAGNOSIS — N94810 Vulvar vestibulitis: Secondary | ICD-10-CM | POA: Diagnosis not present

## 2015-08-22 DIAGNOSIS — Z1231 Encounter for screening mammogram for malignant neoplasm of breast: Secondary | ICD-10-CM | POA: Diagnosis not present

## 2015-08-22 DIAGNOSIS — R102 Pelvic and perineal pain: Secondary | ICD-10-CM | POA: Diagnosis not present

## 2015-08-22 DIAGNOSIS — Z0000 Encounter for general adult medical examination without abnormal findings: Secondary | ICD-10-CM | POA: Diagnosis not present

## 2015-08-22 DIAGNOSIS — Z79890 Hormone replacement therapy (postmenopausal): Secondary | ICD-10-CM | POA: Diagnosis not present

## 2015-08-22 DIAGNOSIS — Z780 Asymptomatic menopausal state: Secondary | ICD-10-CM | POA: Diagnosis not present

## 2015-08-22 DIAGNOSIS — Z1239 Encounter for other screening for malignant neoplasm of breast: Secondary | ICD-10-CM | POA: Diagnosis not present

## 2015-08-22 DIAGNOSIS — N63 Unspecified lump in breast: Secondary | ICD-10-CM | POA: Diagnosis not present

## 2015-08-25 ENCOUNTER — Ambulatory Visit (INDEPENDENT_AMBULATORY_CARE_PROVIDER_SITE_OTHER): Payer: Medicare Other | Admitting: Internal Medicine

## 2015-08-25 ENCOUNTER — Encounter: Payer: Self-pay | Admitting: Internal Medicine

## 2015-08-25 VITALS — BP 110/70 | HR 66 | Temp 98.2°F | Resp 18 | Ht 62.0 in | Wt 157.1 lb

## 2015-08-25 DIAGNOSIS — K219 Gastro-esophageal reflux disease without esophagitis: Secondary | ICD-10-CM

## 2015-08-25 DIAGNOSIS — R1310 Dysphagia, unspecified: Secondary | ICD-10-CM

## 2015-08-25 DIAGNOSIS — K589 Irritable bowel syndrome without diarrhea: Secondary | ICD-10-CM

## 2015-08-25 DIAGNOSIS — R739 Hyperglycemia, unspecified: Secondary | ICD-10-CM

## 2015-08-25 DIAGNOSIS — E039 Hypothyroidism, unspecified: Secondary | ICD-10-CM

## 2015-08-25 DIAGNOSIS — I10 Essential (primary) hypertension: Secondary | ICD-10-CM | POA: Diagnosis not present

## 2015-08-25 DIAGNOSIS — E041 Nontoxic single thyroid nodule: Secondary | ICD-10-CM

## 2015-08-25 DIAGNOSIS — R0989 Other specified symptoms and signs involving the circulatory and respiratory systems: Secondary | ICD-10-CM

## 2015-08-25 DIAGNOSIS — E7800 Pure hypercholesterolemia, unspecified: Secondary | ICD-10-CM

## 2015-08-25 NOTE — Progress Notes (Signed)
[name], female   DOB: November 28, 1931, 81 y.o.   MRN: [id number]   Subjective:    [name], female    DOB: [birth year], 81 y.o.   MRN: [id number]  HPI  Patient with past history of hypercholesterolemia, IBS, GERD and hypertension.  She comes in today to follow up on these issues.  She recently saw gyn.  Everything checked out ok.  They recommended continuing premarin and amitriptyline.  She is on protonix.  Had issues with swallowing.  Saw GI.  Planning for EGD 08/28/15.  Ultrasound revealed 76m right nodule.  Recommended f/u ultrasound in 12 months.  No chest pain or tightness.  No sob.  No abdominal pain or cramping.  Bowels stable.     Past Medical History  Diagnosis Date  . Hypercholesterolemia   . Hypertension   . IBS (irritable bowel syndrome)   . GERD (gastroesophageal reflux disease)   . Headache(784.0)     h/o recurrent vascular headaches  . Diverticulosis   . Urinary tract bacterial infections   . Dysphagia   . Hypothyroidism   . Neck fullness    Past Surgical History  Procedure Laterality Date  . Appendectomy    . Partial hysterectomy    . Cystoscopy      x2  . Cholecystectomy  1990  . Tonsillectomy    . Esophagogastroduodenoscopy (egd) with propofol N/A 08/28/2015    Procedure: ESOPHAGOGASTRODUODENOSCOPY (EGD) WITH PROPOFOL;  Surgeon: PHulen Luster MD;  [facility]  Service: Gastroenterology;  Laterality: N/A;   Family History  Problem Relation Age of Onset  . Heart disease Father   . Heart disease Mother   . Breast cancer Neg Hx   . Colon cancer Neg Hx    Social History   Social History  . Marital Status: Married    Spouse Name: N/A  . Number of Children: 2  . Years of Education: N/A   Occupational History  . retired    Social History Main Topics  . Smoking status: Never Smoker   . Smokeless tobacco: Never Used  . Alcohol Use: No  . Drug Use: No  . Sexual Activity: Not Asked   Other Topics Concern  .  None   Social History Narrative    Outpatient Encounter Prescriptions as of 08/25/2015  Medication Sig  . amitriptyline (ELAVIL) 25 MG tablet Take 25 mg by mouth at bedtime.  .Marland KitchenamLODipine (NORVASC) 10 MG tablet TAKE 1 TABLET DAILY  . amLODipine (NORVASC) 10 MG tablet TAKE 1 TABLET DAILY  . aspirin 81 MG tablet Take 81 mg by mouth daily.  .Marland Kitchenatorvastatin (LIPITOR) 40 MG tablet TAKE 1 TABLET DAILY  . estrogens, conjugated, (PREMARIN) 0.9 MG tablet Take 0.9 mg by mouth daily.   .Marland Kitchenlevothyroxine (SYNTHROID) 50 MCG tablet Take 1 tablet (50 mcg total) by mouth daily before breakfast.  . metoprolol succinate (TOPROL-XL) 50 MG 24 hr tablet TAKE 1 TABLET TWICE A DAY  WITH OR IMMEDIATELY        FOLLOWING A MEAL  . pantoprazole (PROTONIX) 40 MG tablet TAKE 1 TABLET DAILY   No facility-administered encounter medications on file as of 08/25/2015.    Review of Systems  Constitutional: Negative for appetite change and unexpected weight change.  HENT: Negative for congestion and sinus pressure.   Eyes: Negative for pain and discharge.  Respiratory: Negative for cough, chest tightness and shortness of breath.   Cardiovascular: Negative for chest pain, palpitations and leg  swelling.  Gastrointestinal: Negative for nausea, vomiting, abdominal pain and diarrhea.  Genitourinary: Negative for dysuria and difficulty urinating.  Musculoskeletal: Negative for back pain and joint swelling.  Skin: Negative for color change and rash.  Neurological: Negative for dizziness, light-headedness and headaches.  Hematological: Negative for adenopathy. Does not bruise/bleed easily.  Psychiatric/Behavioral: Negative for dysphoric mood and agitation.       Objective:    Physical Exam  Constitutional: She appears well-developed and well-nourished. No distress.  HENT:  Nose: Nose normal.  Mouth/Throat: Oropharynx is clear and moist.  Eyes: Conjunctivae are normal. Right eye exhibits no discharge. Left eye exhibits no  discharge.  Neck: Neck supple. No thyromegaly present.  Cardiovascular: Normal rate and regular rhythm.   Pulmonary/Chest: Breath sounds normal. No respiratory distress. She has no wheezes.  Abdominal: Soft. Bowel sounds are normal. There is no tenderness.  Musculoskeletal: She exhibits no edema or tenderness.  Lymphadenopathy:    She has no cervical adenopathy.  Skin: No rash noted. No erythema.  Psychiatric: She has a normal mood and affect. Her behavior is normal.    BP 110/70 mmHg  Pulse 66  Temp(Src) 98.2 F (36.8 C) (Oral)  Resp 18  Ht _0  (1.575 m)  Wt 157 lb 2 oz (71.271 kg)  BMI 28.73 kg/m2  SpO2 94% Wt Readings from Last 3 Encounters:  08/25/15 157 lb 2 oz (71.271 kg)  07/18/15 159 lb (72.122 kg)  04/24/15 161 lb 4 oz (73.143 kg)     Lab Results  Component Value Date   WBC 7.1 12/23/2014   HGB 13.1 12/23/2014   HCT 38.3 12/23/2014   PLT 208.0 12/23/2014   GLUCOSE 99 08/21/2015   CHOL 152 08/21/2015   TRIG 164.0* 08/21/2015   HDL 63.40 08/21/2015   LDLDIRECT 64.9 12/08/2012   LDLCALC 56 08/21/2015   ALT 13 08/21/2015   AST 18 08/21/2015   NA 138 08/21/2015   K 4.1 08/21/2015   CL 103 08/21/2015   CREATININE 1.17 08/21/2015   BUN 15 08/21/2015   CO2 27 08/21/2015   TSH 2.78 08/21/2015   HGBA1C 5.5 08/21/2015    US Soft Tissue Head/neck  07/25/2015  CLINICAL DATA:  Dysphagia, neck fullness EXAM: THYROID ULTRASOUND TECHNIQUE: Ultrasound examination of the thyroid gland and adjacent soft tissues was performed. COMPARISON:  None. FINDINGS: Right thyroid lobe Measurements: 35 x 9 x 9 mm.  6 mm hypoechoic nodule, inferior pole. Left thyroid lobe Measurements: 30 x 8 x 10 mm.  No nodules visualized. Isthmus Thickness: 3 mm.  No nodules visualized. Lymphadenopathy None visualized. IMPRESSION: 1. Small thyroid with a single 6 mm right nodule. Findings do not meet current consensus criteria for biopsy. Follow-up by clinical exam is recommended. If patient has  known risk factors for thyroid carcinoma, consider follow-up ultrasound in 12 months. If patient is clinically hyperthyroid, consider nuclear medicine thyroid uptake and scan. This recommendation follows the consensus statement: Management of Thyroid Nodules Detected as [place]: Society of Radiologists in [place]. Radiology 2005; [id number]. Electronically Signed   By: Lucrezia Europe M.D.   On: 07/25/2015 15:57       Assessment & Plan:   Problem List Items Addressed This Visit    Dysphagia    Small bites.  Chew food well.  Eat slowly.  Saw GI.  On protonix.  Planning for EGD 12//8/16.        GERD (gastroesophageal reflux disease)    On protonix.  Saw GI.  Planning  for EGD 08/28/15.        Hypercholesterolemia    On lipitor.  Low cholesterol diet and exercise.  Follow lipid panel and liver function tests.   Lab Results  Component Value Date   CHOL 152 08/21/2015   HDL 63.40 08/21/2015   LDLCALC 56 08/21/2015   LDLDIRECT 64.9 12/08/2012   TRIG 164.0* 08/21/2015   CHOLHDL 2 08/21/2015        Relevant Orders   Lipid panel   Hepatic function panel   Hyperglycemia    Low carb diet and exercise.  Follow met b and a1c.       Relevant Orders   Hemoglobin A1c   Hypertension - Primary    Blood pressure as outlined.  Same medication regimen.  Follow pressures.  Follow metabolic panel.        Relevant Orders   CBC with Differential/Platelet   Basic metabolic panel   Hypothyroidism    On thyroid replacement.  Follow tsh.       IBS (irritable bowel syndrome)    Colonoscopy 02/21/12 - diverticulosis, otherwise normal.  Bowels stable.        Right carotid bruit    06/24/14 - carotid ultrasound as outlined.  Recommended f/u carotid ultrasound in one year.  Needs f/u carotid ultrasound.        Thyroid nodule    Found on thyroid ultrasound - 15m nodule.  F/u thyroid ultrasound in one year.            SEinar Pheasant MD

## 2015-08-25 NOTE — Progress Notes (Signed)
Pre-visit discussion using our clinic review tool. No additional management support is needed unless otherwise documented below in the visit note.  

## 2015-08-27 ENCOUNTER — Encounter: Payer: Self-pay | Admitting: *Deleted

## 2015-08-28 ENCOUNTER — Ambulatory Visit
Admission: RE | Admit: 2015-08-28 | Discharge: 2015-08-28 | Disposition: A | Discharge status: Home / Self Care | Payer: Medicare Other | Source: Ambulatory Visit | Attending: Gastroenterology | Admitting: Gastroenterology

## 2015-08-28 ENCOUNTER — Encounter
Admission: RE | Disposition: A | Discharge status: Home / Self Care | Payer: Self-pay | Source: Ambulatory Visit | Attending: Gastroenterology

## 2015-08-28 ENCOUNTER — Ambulatory Visit: Payer: Medicare Other | Admitting: Anesthesiology

## 2015-08-28 ENCOUNTER — Encounter: Payer: Self-pay | Admitting: *Deleted

## 2015-08-28 DIAGNOSIS — K222 Esophageal obstruction: Secondary | ICD-10-CM | POA: Diagnosis not present

## 2015-08-28 DIAGNOSIS — E039 Hypothyroidism, unspecified: Secondary | ICD-10-CM | POA: Diagnosis not present

## 2015-08-28 DIAGNOSIS — K219 Gastro-esophageal reflux disease without esophagitis: Secondary | ICD-10-CM | POA: Diagnosis not present

## 2015-08-28 DIAGNOSIS — Z86010 Personal history of colonic polyps: Secondary | ICD-10-CM | POA: Diagnosis not present

## 2015-08-28 DIAGNOSIS — R1310 Dysphagia, unspecified: Secondary | ICD-10-CM | POA: Diagnosis not present

## 2015-08-28 DIAGNOSIS — K449 Diaphragmatic hernia without obstruction or gangrene: Secondary | ICD-10-CM | POA: Insufficient documentation

## 2015-08-28 DIAGNOSIS — Z7982 Long term (current) use of aspirin: Secondary | ICD-10-CM | POA: Insufficient documentation

## 2015-08-28 DIAGNOSIS — R102 Pelvic and perineal pain: Secondary | ICD-10-CM | POA: Diagnosis not present

## 2015-08-28 DIAGNOSIS — I10 Essential (primary) hypertension: Secondary | ICD-10-CM | POA: Diagnosis not present

## 2015-08-28 DIAGNOSIS — R140 Abdominal distension (gaseous): Secondary | ICD-10-CM | POA: Insufficient documentation

## 2015-08-28 DIAGNOSIS — E7800 Pure hypercholesterolemia, unspecified: Secondary | ICD-10-CM | POA: Insufficient documentation

## 2015-08-28 HISTORY — DX: Hypothyroidism, unspecified: E03.9

## 2015-08-28 HISTORY — DX: Localized swelling, mass and lump, neck: R22.1

## 2015-08-28 HISTORY — PX: ESOPHAGOGASTRODUODENOSCOPY (EGD) WITH PROPOFOL: SHX5813

## 2015-08-28 HISTORY — DX: Dysphagia, unspecified: R13.10

## 2015-08-28 SURGERY — ESOPHAGOGASTRODUODENOSCOPY (EGD) WITH PROPOFOL
Anesthesia: General

## 2015-08-28 MED ORDER — PROPOFOL 10 MG/ML IV BOLUS
INTRAVENOUS | Status: DC | PRN
  Administered 2015-08-28: 10 mg via INTRAVENOUS
  Administered 2015-08-28: 20 mg via INTRAVENOUS
  Administered 2015-08-28: 10 mg via INTRAVENOUS

## 2015-08-28 MED ORDER — SODIUM CHLORIDE 0.9 % IV SOLN: INTRAVENOUS | Status: DC

## 2015-08-28 MED ORDER — SODIUM CHLORIDE 0.9 % IV SOLN
INTRAVENOUS | Status: DC
  Administered 2015-08-28: 08:00:00 via INTRAVENOUS

## 2015-08-28 NOTE — Anesthesia Preprocedure Evaluation (Addendum)
Anesthesia Evaluation  Patient identified by MRN, date of birth, ID band Patient awake    Reviewed: Allergy & Precautions, H&P , NPO status , Patient's Chart, lab work & pertinent test results  History of Anesthesia Complications Negative for: history of anesthetic complications  Airway Mallampati: III  TM Distance: <3 FB Neck ROM: limited    Dental  (+) Poor Dentition   Pulmonary neg pulmonary ROS, neg shortness of breath,    Pulmonary exam normal breath sounds clear to auscultation       Cardiovascular Exercise Tolerance: Good hypertension, (-) angina(-) Past MI and (-) DOE Normal cardiovascular exam Rhythm:regular Rate:Normal     Neuro/Psych  Headaches, negative psych ROS   GI/Hepatic Neg liver ROS, GERD  Controlled,  Endo/Other  Hypothyroidism   Renal/GU negative Renal ROS  negative genitourinary   Musculoskeletal   Abdominal   Peds  Hematology negative hematology ROS (+)   Anesthesia Other Findings Past Medical History:   Hypercholesterolemia                                         Hypertension                                                 IBS (irritable bowel syndrome)                               GERD (gastroesophageal reflux disease)                       Headache(784.0)                                                Comment:h/o recurrent vascular headaches   Diverticulosis                                               Urinary tract bacterial infections                           Dysphagia                                                    Hypothyroidism                                               Neck fullness                                               Past Surgical History:   APPENDECTOMY  PARTIAL HYSTERECTOMY                                          CYSTOSCOPY                                                      Comment:x2   CHOLECYSTECTOMY                                   1990         TONSILLECTOMY                                                   Reproductive/Obstetrics negative OB ROS                            Anesthesia Physical Anesthesia Plan  ASA: III  Anesthesia Plan: General   Post-op Pain Management:    Induction:   Airway Management Planned:   Additional Equipment:   Intra-op Plan:   Post-operative Plan:   Informed Consent: I have reviewed the patients History and Physical, chart, labs and discussed the procedure including the risks, benefits and alternatives for the proposed anesthesia with the patient or authorized representative who has indicated his/her understanding and acceptance.   Dental Advisory Given  Plan Discussed with: Anesthesiologist, CRNA and Surgeon  Anesthesia Plan Comments:         Anesthesia Quick Evaluation

## 2015-08-28 NOTE — Transfer of Care (Signed)
Immediate Anesthesia Transfer of Care Note  Patient: Elizabeth Henderson  Procedure(s) Performed: Procedure(s): ESOPHAGOGASTRODUODENOSCOPY (EGD) WITH PROPOFOL (N/A)  Patient Location: PACU  Anesthesia Type:General  Level of Consciousness: awake, alert  and oriented  Airway & Oxygen Therapy: Patient Spontanous Breathing and Patient connected to nasal cannula oxygen  Post-op Assessment: Report given to RN and Post -op Vital signs reviewed and stable  Post vital signs: Reviewed and stable  Last Vitals:  Filed Vitals:   08/28/15 0802  BP: 147/67  Pulse: 68  Temp: 36.7 C  Resp: 12    Complications: No apparent anesthesia complications

## 2015-08-28 NOTE — Anesthesia Postprocedure Evaluation (Signed)
Anesthesia Post Note  Patient: GUNEET DETORRES  Procedure(s) Performed: Procedure(s) (LRB): ESOPHAGOGASTRODUODENOSCOPY (EGD) WITH PROPOFOL (N/A)  Patient location during evaluation: Endoscopy Anesthesia Type: General Level of consciousness: awake and alert Pain management: pain level controlled Vital Signs Assessment: post-procedure vital signs reviewed and stable Respiratory status: spontaneous breathing, nonlabored ventilation, respiratory function stable and patient connected to nasal cannula oxygen Cardiovascular status: blood pressure returned to baseline and stable Postop Assessment: no signs of nausea or vomiting Anesthetic complications: no    Last Vitals:  Filed Vitals:   08/28/15 0850 08/28/15 0900  BP: 138/72 136/123  Pulse: 71 66  Temp:    Resp: 19 21    Last Pain: There were no vitals filed for this visit.               Precious Haws Piscitello

## 2015-08-28 NOTE — H&P (Signed)
  Date of Initial H&P: 07/30/2015 History reviewed, patient examined, no change in status, stable for surgery.

## 2015-08-28 NOTE — Op Note (Addendum)
[facility] Gastroenterology Patient Name: Elizabeth Henderson Procedure Date: 08/28/2015 7:49 AM MRN: [id number] Account #: [id number] Date of Birth: 1931/09/23 Admit Type: Outpatient Age: 81 Room: [facility] ENDO ROOM 4 Gender: Female Note Status: Finalized Procedure:         Upper GI endoscopy Indications:       Dysphagia, Suspected esophageal reflux Providers:         Lupita Dawn. Candace Cruise, MD Referring MD:      Einar Pheasant, MD (Referring MD) Medicines:         Monitored Anesthesia Care Complications:     No immediate complications. Procedure:         Pre-Anesthesia Assessment:                    - Prior to the procedure, a History and Physical was                     performed, and patient medications, allergies and                     sensitivities were reviewed. The patient's tolerance of                     previous anesthesia was reviewed.                    - The risks and benefits of the procedure and the sedation                     options and risks were discussed with the patient. All                     questions were answered and informed consent was obtained.                    - After reviewing the risks and benefits, the patient was                     deemed in satisfactory condition to undergo the procedure.                    After obtaining informed consent, the endoscope was passed                     under direct vision. Throughout the procedure, the                     patient's blood pressure, pulse, and oxygen saturations                     were monitored continuously. The Endoscope was introduced                     through the mouth, and advanced to the second part of                     duodenum. The upper GI endoscopy was accomplished without                     difficulty. The patient tolerated the procedure well. Findings:      One mild benign-appearing, intrinsic stenosis was found at the       gastroesophageal junction. The scope was withdrawn.  Dilation was       performed with a Venia Minks  dilator with mild resistance at 54 Fr.      The exam was otherwise without abnormality.      A medium-sized hiatus hernia was present.      The exam was otherwise without abnormality.      The examined duodenum was normal. Impression:        - Benign-appearing esophageal stenosis. Dilated.                    - The examination was otherwise normal.                    - Medium-sized hiatus hernia.                    - The examination was otherwise normal.                    - Normal examined duodenum.                    - No specimens collected. Recommendation:    - Discharge patient to home.                    - Observe patient's clinical course.                    - The findings and recommendations were discussed with the                     patient.                    - Continue present medications. Procedure Code(s): --- Professional ---                    [phone], Esophagogastroduodenoscopy, flexible, transoral;                     diagnostic, including collection of specimen(s) by                     brushing or washing, when performed (separate procedure)                    43450, Dilation of esophagus, by unguided sound or bougie,                     single or multiple passes Diagnosis Code(s): --- Professional ---                    K22.2, Esophageal obstruction                    K44.9, Diaphragmatic hernia without obstruction or gangrene                    R13.10, Dysphagia, unspecified CPT copyright 2014 American Medical Association. All rights reserved. The codes documented in this report are preliminary and upon coder review may  be revised to meet current compliance requirements. Hulen Luster, MD 08/28/2015 8:34:58 AM This report has been signed electronically. Number of Addenda: 0 Note Initiated On: 08/28/2015 7:49 AM      [facility]

## 2015-08-30 ENCOUNTER — Encounter: Payer: Self-pay | Admitting: Gastroenterology

## 2015-08-31 ENCOUNTER — Encounter: Payer: Self-pay | Admitting: Internal Medicine

## 2015-08-31 DIAGNOSIS — E041 Nontoxic single thyroid nodule: Secondary | ICD-10-CM | POA: Insufficient documentation

## 2015-08-31 NOTE — Assessment & Plan Note (Signed)
Colonoscopy 02/21/12 - diverticulosis, otherwise normal.  Bowels stable.  

## 2015-08-31 NOTE — Assessment & Plan Note (Addendum)
06/24/14 - carotid ultrasound as outlined.  Recommended f/u carotid ultrasound in one year.  Needs f/u carotid ultrasound.

## 2015-08-31 NOTE — Assessment & Plan Note (Addendum)
On lipitor.  Low cholesterol diet and exercise.  Follow lipid panel and liver function tests.   Lab Results  Component Value Date   CHOL 152 08/21/2015   HDL 63.40 08/21/2015   LDLCALC 56 08/21/2015   LDLDIRECT 64.9 12/08/2012   TRIG 164.0* 08/21/2015   CHOLHDL 2 08/21/2015

## 2015-08-31 NOTE — Assessment & Plan Note (Signed)
Found on thyroid ultrasound - 14mm nodule.  F/u thyroid ultrasound in one year.

## 2015-08-31 NOTE — Assessment & Plan Note (Signed)
On protonix.  Saw GI.  Planning for EGD 08/28/15.

## 2015-08-31 NOTE — Assessment & Plan Note (Signed)
Small bites.  Chew food well.  Eat slowly.  Saw GI.  On protonix.  Planning for EGD 12//8/16.

## 2015-08-31 NOTE — Assessment & Plan Note (Signed)
Low carb diet and exercise.  Follow met b and a1c.  

## 2015-08-31 NOTE — Assessment & Plan Note (Signed)
Blood pressure as outlined.  Same medication regimen.  Follow pressures.  Follow metabolic panel.  

## 2015-08-31 NOTE — Assessment & Plan Note (Signed)
On thyroid replacement.  Follow tsh.  

## 2015-09-08 ENCOUNTER — Other Ambulatory Visit: Payer: Self-pay | Admitting: Internal Medicine

## 2015-10-05 ENCOUNTER — Other Ambulatory Visit: Payer: Self-pay | Admitting: Internal Medicine

## 2015-10-07 DIAGNOSIS — I10 Essential (primary) hypertension: Secondary | ICD-10-CM | POA: Diagnosis not present

## 2015-10-07 DIAGNOSIS — M79609 Pain in unspecified limb: Secondary | ICD-10-CM | POA: Diagnosis not present

## 2015-10-07 DIAGNOSIS — M7989 Other specified soft tissue disorders: Secondary | ICD-10-CM | POA: Diagnosis not present

## 2015-10-07 DIAGNOSIS — I890 Lymphedema, not elsewhere classified: Secondary | ICD-10-CM | POA: Diagnosis not present

## 2015-10-10 DIAGNOSIS — M1712 Unilateral primary osteoarthritis, left knee: Secondary | ICD-10-CM | POA: Diagnosis not present

## 2015-10-27 ENCOUNTER — Ambulatory Visit (INDEPENDENT_AMBULATORY_CARE_PROVIDER_SITE_OTHER): Payer: Medicare Other

## 2015-10-27 VITALS — BP 134/72 | HR 64 | Temp 97.7°F | Resp 14 | Ht 61.0 in | Wt 156.1 lb

## 2015-10-27 DIAGNOSIS — Z0000 Encounter for general adult medical examination without abnormal findings: Secondary | ICD-10-CM

## 2015-10-27 NOTE — Patient Instructions (Addendum)
Elizabeth Henderson,  Thank you for taking time to come for your Medicare Wellness Visit.  I appreciate your ongoing commitment to your health goals. Please review the following plan we discussed and let me know if I can assist you in the future.  Follow up with Dr. Nicki Reaper as needed.  Health Maintenance, Female Adopting a healthy lifestyle and getting preventive care can go a long way to promote health and wellness. Talk with your health care provider about what schedule of regular examinations is right for you. This is a good chance for you to check in with your provider about disease prevention and staying healthy. In between checkups, there are plenty of things you can do on your own. Experts have done a lot of research about which lifestyle changes and preventive measures are most likely to keep you healthy. Ask your health care provider for more information. WEIGHT AND DIET  Eat a healthy diet  Be sure to include plenty of vegetables, fruits, low-fat dairy products, and lean protein.  Do not eat a lot of foods high in solid fats, added sugars, or salt.  Get regular exercise. This is one of the most important things you can do for your health.  Most adults should exercise for at least 150 minutes each week. The exercise should increase your heart rate and make you sweat (moderate-intensity exercise).  Most adults should also do strengthening exercises at least twice a week. This is in addition to the moderate-intensity exercise.  Maintain a healthy weight  Body mass index (BMI) is a measurement that can be used to identify possible weight problems. It estimates body fat based on height and weight. Your health care provider can help determine your BMI and help you achieve or maintain a healthy weight.  For females 19 years of age and older:   A BMI below 18.5 is considered underweight.  A BMI of 18.5 to 24.9 is normal.  A BMI of 25 to 29.9 is considered overweight.  A BMI of 30 and above  is considered obese.  Watch levels of cholesterol and blood lipids  You should start having your blood tested for lipids and cholesterol at 80 years of age, then have this test every 5 years.  You may need to have your cholesterol levels checked more often if:  Your lipid or cholesterol levels are high.  You are older than 80 years of age.  You are at high risk for heart disease.  CANCER SCREENING   Lung Cancer  Lung cancer screening is recommended for adults 33-32 years old who are at high risk for lung cancer because of a history of smoking.  A yearly low-dose CT scan of the lungs is recommended for people who:  Currently smoke.  Have quit within the past 15 years.  Have at least a 30-pack-year history of smoking. A pack year is smoking an average of one pack of cigarettes a day for 1 year.  Yearly screening should continue until it has been 15 years since you quit.  Yearly screening should stop if you develop a health problem that would prevent you from having lung cancer treatment.  Breast Cancer  Practice breast self-awareness. This means understanding how your breasts normally appear and feel.  It also means doing regular breast self-exams. Let your health care provider know about any changes, no matter how small.  If you are in your 20s or 30s, you should have a clinical breast exam (CBE) by a health care provider  every 1-3 years as part of a regular health exam.  If you are 64 or older, have a CBE every year. Also consider having a breast X-ray (mammogram) every year.  If you have a family history of breast cancer, talk to your health care provider about genetic screening.  If you are at high risk for breast cancer, talk to your health care provider about having an MRI and a mammogram every year.  Breast cancer gene (BRCA) assessment is recommended for women who have family members with BRCA-related cancers. BRCA-related cancers  include:  Breast.  Ovarian.  Tubal.  Peritoneal cancers.  Results of the assessment will determine the need for genetic counseling and BRCA1 and BRCA2 testing. Cervical Cancer Your health care provider may recommend that you be screened regularly for cancer of the pelvic organs (ovaries, uterus, and vagina). This screening involves a pelvic examination, including checking for microscopic changes to the surface of your cervix (Pap test). You may be encouraged to have this screening done every 3 years, beginning at age 77.  For women ages 58-65, health care providers may recommend pelvic exams and Pap testing every 3 years, or they may recommend the Pap and pelvic exam, combined with testing for human papilloma virus (HPV), every 5 years. Some types of HPV increase your risk of cervical cancer. Testing for HPV may also be done on women of any age with unclear Pap test results.  Other health care providers may not recommend any screening for nonpregnant women who are considered low risk for pelvic cancer and who do not have symptoms. Ask your health care provider if a screening pelvic exam is right for you.  If you have had past treatment for cervical cancer or a condition that could lead to cancer, you need Pap tests and screening for cancer for at least 20 years after your treatment. If Pap tests have been discontinued, your risk factors (such as having a new sexual partner) need to be reassessed to determine if screening should resume. Some women have medical problems that increase the chance of getting cervical cancer. In these cases, your health care provider may recommend more frequent screening and Pap tests. Colorectal Cancer  This type of cancer can be detected and often prevented.  Routine colorectal cancer screening usually begins at 80 years of age and continues through 80 years of age.  Your health care provider may recommend screening at an earlier age if you have risk factors for  colon cancer.  Your health care provider may also recommend using home test kits to check for hidden blood in the stool.  A small camera at the end of a tube can be used to examine your colon directly (sigmoidoscopy or colonoscopy). This is done to check for the earliest forms of colorectal cancer.  Routine screening usually begins at age 86.  Direct examination of the colon should be repeated every 5-10 years through 80 years of age. However, you may need to be screened more often if early forms of precancerous polyps or small growths are found. Skin Cancer  Check your skin from head to toe regularly.  Tell your health care provider about any new moles or changes in moles, especially if there is a change in a mole's shape or color.  Also tell your health care provider if you have a mole that is larger than the size of a pencil eraser.  Always use sunscreen. Apply sunscreen liberally and repeatedly throughout the day.  Protect yourself by  wearing long sleeves, pants, a wide-brimmed hat, and sunglasses whenever you are outside. HEART DISEASE, DIABETES, AND HIGH BLOOD PRESSURE   High blood pressure causes heart disease and increases the risk of stroke. High blood pressure is more likely to develop in:  People who have blood pressure in the high end of the normal range (130-139/85-89 mm Hg).  People who are overweight or obese.  People who are African American.  If you are 31-28 years of age, have your blood pressure checked every 3-5 years. If you are 51 years of age or older, have your blood pressure checked every year. You should have your blood pressure measured twice--once when you are at a hospital or clinic, and once when you are not at a hospital or clinic. Record the average of the two measurements. To check your blood pressure when you are not at a hospital or clinic, you can use:  An automated blood pressure machine at a pharmacy.  A home blood pressure monitor.  If you  are between 76 years and 23 years old, ask your health care provider if you should take aspirin to prevent strokes.  Have regular diabetes screenings. This involves taking a blood sample to check your fasting blood sugar level.  If you are at a normal weight and have a low risk for diabetes, have this test once every three years after 80 years of age.  If you are overweight and have a high risk for diabetes, consider being tested at a younger age or more often. PREVENTING INFECTION  Hepatitis B  If you have a higher risk for hepatitis B, you should be screened for this virus. You are considered at high risk for hepatitis B if:  You were born in a country where hepatitis B is common. Ask your health care provider which countries are considered high risk.  Your parents were born in a high-risk country, and you have not been immunized against hepatitis B (hepatitis B vaccine).  You have HIV or AIDS.  You use needles to inject street drugs.  You live with someone who has hepatitis B.  You have had sex with someone who has hepatitis B.  You get hemodialysis treatment.  You take certain medicines for conditions, including cancer, organ transplantation, and autoimmune conditions. Hepatitis C  Blood testing is recommended for:  Everyone born from [birth year] through 1963.  Anyone with known risk factors for hepatitis C. Sexually transmitted infections (STIs)  You should be screened for sexually transmitted infections (STIs) including gonorrhea and chlamydia if:  You are sexually active and are younger than 80 years of age.  You are older than 80 years of age and your health care provider tells you that you are at risk for this type of infection.  Your sexual activity has changed since you were last screened and you are at an increased risk for chlamydia or gonorrhea. Ask your health care provider if you are at risk.  If you do not have HIV, but are at risk, it may be recommended that you  take a prescription medicine daily to prevent HIV infection. This is called pre-exposure prophylaxis (PrEP). You are considered at risk if:  You are sexually active and do not regularly use condoms or know the HIV status of your partner(s).  You take drugs by injection.  You are sexually active with a partner who has HIV. Talk with your health care provider about whether you are at high risk of being infected with HIV. If  you choose to begin PrEP, you should first be tested for HIV. You should then be tested every 3 months for as long as you are taking PrEP.  PREGNANCY   If you are premenopausal and you may become pregnant, ask your health care provider about preconception counseling.  If you may become pregnant, take 400 to 800 micrograms (mcg) of folic acid every day.  If you want to prevent pregnancy, talk to your health care provider about birth control (contraception). OSTEOPOROSIS AND MENOPAUSE   Osteoporosis is a disease in which the bones lose minerals and strength with aging. This can result in serious bone fractures. Your risk for osteoporosis can be identified using a bone density scan.  If you are 40 years of age or older, or if you are at risk for osteoporosis and fractures, ask your health care provider if you should be screened.  Ask your health care provider whether you should take a calcium or vitamin D supplement to lower your risk for osteoporosis.  Menopause may have certain physical symptoms and risks.  Hormone replacement therapy may reduce some of these symptoms and risks. Talk to your health care provider about whether hormone replacement therapy is right for you.  HOME CARE INSTRUCTIONS   Schedule regular health, dental, and eye exams.  Stay current with your immunizations.   Do not use any tobacco products including cigarettes, chewing tobacco, or electronic cigarettes.  If you are pregnant, do not drink alcohol.  If you are breastfeeding, limit how  much and how often you drink alcohol.  Limit alcohol intake to no more than 1 drink per day for nonpregnant women. One drink equals 12 ounces of beer, 5 ounces of wine, or 1 ounces of hard liquor.  Do not use street drugs.  Do not share needles.  Ask your health care provider for help if you need support or information about quitting drugs.  Tell your health care provider if you often feel depressed.  Tell your health care provider if you have ever been abused or do not feel safe at home.   This information is not intended to replace advice given to you by your health care provider. Make sure you discuss any questions you have with your health care provider.   Document Released: 03/22/2011 Document Revised: 09/27/2014 Document Reviewed: 08/08/2013 Elsevier Interactive Patient Education [employer].

## 2015-10-27 NOTE — Progress Notes (Signed)
Subjective:   Elizabeth Henderson is a 82 y.o. female who presents for an Initial Medicare Annual Wellness Visit.  Review of Systems    No ROS.  Medicare Wellness Visit.  Cardiac Risk Factors include: advanced age (>35men, >77 women);hypertension     Objective:    Today's Vitals   10/27/15 1015  BP: 134/72  Pulse: 64  Temp: 97.7 F (36.5 C)  TempSrc: Oral  Resp: 14  Height: 5\' 1"  (1.549 m)  Weight: 156 lb 1.9 oz (70.816 kg)  SpO2: 96%    Current Medications (verified) Outpatient Encounter Prescriptions as of 10/27/2015  Medication Sig  . amitriptyline (ELAVIL) 25 MG tablet Take 25 mg by mouth at bedtime.  Marland Kitchen amLODipine (NORVASC) 10 MG tablet TAKE 1 TABLET DAILY  . amLODipine (NORVASC) 10 MG tablet TAKE 1 TABLET DAILY  . aspirin 81 MG tablet Take 81 mg by mouth daily.  Marland Kitchen atorvastatin (LIPITOR) 40 MG tablet TAKE 1 TABLET DAILY  . estrogens, conjugated, (PREMARIN) 0.9 MG tablet Take 0.9 mg by mouth daily.   Marland Kitchen gabapentin (NEURONTIN) 300 MG capsule Take 300 mg by mouth 3 (three) times daily.  . metoprolol succinate (TOPROL-XL) 50 MG 24 hr tablet TAKE 1 TABLET TWICE A DAY  WITH OR IMMEDIATELY        FOLLOWING A MEAL  . metoprolol succinate (TOPROL-XL) 50 MG 24 hr tablet TAKE 1 TABLET TWICE A DAY  WITH OR IMMEDIATELY        FOLLOWING A MEAL  . pantoprazole (PROTONIX) 40 MG tablet TAKE 1 TABLET DAILY  . SYNTHROID 50 MCG tablet TAKE 1 TABLET DAILY BEFORE BREAKFAST   No facility-administered encounter medications on file as of 10/27/2015.    Allergies (verified) Ace inhibitors; Floxin; Sulfa antibiotics; and Vioxx   History: Past Medical History  Diagnosis Date  . Hypercholesterolemia   . Hypertension   . IBS (irritable bowel syndrome)   . GERD (gastroesophageal reflux disease)   . Headache(784.0)     h/o recurrent vascular headaches  . Diverticulosis   . Urinary tract bacterial infections   . Dysphagia   . Hypothyroidism   . Neck fullness    Past Surgical History    Procedure Laterality Date  . Appendectomy    . Partial hysterectomy    . Cystoscopy      x2  . Cholecystectomy  1990  . Tonsillectomy    . Esophagogastroduodenoscopy (egd) with propofol N/A 08/28/2015    Procedure: ESOPHAGOGASTRODUODENOSCOPY (EGD) WITH PROPOFOL;  Surgeon: Hulen Luster, MD;  [facility] END[facility]COPY;  Service: Gastroenterology;  Laterality: N/A;   Family History  Problem Relation Age of Onset  . Heart disease Father   . Heart disease Mother   . Breast cancer Neg Hx   . Colon cancer Neg Hx    Social History   Occupational History  . retired    Social History Main Topics  . Smoking status: Never Smoker   . Smokeless tobacco: Never Used  . Alcohol Use: No  . Drug Use: No  . Sexual Activity: Not Currently    Tobacco Counseling Counseling given: Not Answered   Activities of Daily Living In your present state of health, do you have any difficulty performing the following activities: 10/27/2015  Hearing? N  Vision? N  Difficulty concentrating or making decisions? N  Walking or climbing stairs? Y  Dressing or bathing? N  Doing errands, shopping? N  Preparing Food and eating ? N  Using the Toilet? N  In  the past six months, have you accidently leaked urine? N  Do you have problems with loss of bowel control? N  Managing your Medications? N  Managing your Finances? N  Housekeeping or managing your Housekeeping? N    Immunizations and Health Maintenance Immunization History  Administered Date(s) Administered  . Influenza Split 07/17/2013  . Influenza,inj,Quad PF,36+ Mos 06/20/2014, 07/02/2015  . Pneumococcal Conjugate-13 12/17/2013  . Zoster 12/23/2011   Health Maintenance Due  Topic Date Due  . Samul Dada  07/24/1951    Patient Care Team: Einar Pheasant, MD as PCP - General (Internal Medicine)  Indicate any recent Medical Services you may have received from other than [facility] providers in the past year (date may be approximate).     Assessment:    This is a routine wellness examination for [place].  The goal of the wellness visit is to assist the patient how to close the gaps in care and create a preventative care plan for the patient.   Taking VIT D Calcium as appropriate/Osteoporosis risk reviewed.   Medications reviewed; taking without issues or barriers.   Safety issues reviewed; smoke detectors in the home. No firearms in the home. Wears seatbelts when driving or riding with others. No violence in the home.  No identified risk were noted; The patient was oriented x 3; appropriate in dress and manner and no objective failures at ADL's or IADL's.   Drug induced neutr-stable and followed by PCP Other drug-induced -stable and followed by PCP Malig neo corpus ut-stable and followed by PCP Malignant neoplasm-stable and followed by PCP  Patient Concerns:  Received recent injection to L knee for pain at [facility].  She reports doing well and walking without pain.  Deferred to follow up with PCP as needed.  Hearing/Vision screen Hearing Screening Comments: Passes the whisper test Vision Screening Comments: Followed by [facility] Wears glasses Annual visits  Dietary issues and exercise activities discussed: Current Exercise Habits:: Exercise is limited by (L knee pain.  Chair exercises encouraged.)  Goals    . Healthy Lifestyle     Stay hydrated! Drink plenty of fluids. Choose lean meats (chicken, [place], fish), fruits and vegetables. Start chair exercises as demonstrated, as tolerated.  Education provided.      Depression Screen PHQ 2/9 Scores 10/27/2015 12/23/2014 06/20/2014 02/18/2013 12/12/2012 12/12/2012 08/11/2012  PHQ - 2 Score 0 0 0 0 0 0 0    Fall Risk Fall Risk  10/27/2015 12/23/2014 06/20/2014 02/18/2013 12/12/2012  Falls in the past year? No No No No No    Cognitive Function: MMSE - Mini Mental State Exam 10/27/2015  Orientation to time 5  Orientation to Place 5  Registration 3  Attention/  Calculation 5  Recall 3  Language- name 2 objects 2  Language- repeat 1  Language- follow 3 step command 3  Language- read & follow direction 1  Write a sentence 1  Copy design 1  Total score 30    Screening Tests Health Maintenance  Topic Date Due  . TETANUS/TDAP  07/24/1951  . INFLUENZA VACCINE  04/20/2016  . MAMMOGRAM  09/02/2016  . DEXA SCAN  Completed  . ZOSTAVAX  Addressed  . PNA vac Low Risk Adult  Addressed      Plan:   End of life planning; Advance aging; Advanced directives discussed. Copy requested of current HCPOA/Living Will.   During the course of the visit, Lenayah was educated and counseled about the following appropriate screening and preventive services:   Vaccines  to include Pneumoccal, Influenza, Hepatitis B, Td, Zostavax, HCV  Electrocardiogram  Cardiovascular disease screening  Colorectal cancer screening  Bone density screening  Diabetes screening  Glaucoma screening  Mammography/PAP  Nutrition counseling  Smoking cessation counseling  Patient Instructions (the written plan) were given to the patient.    Varney Biles, LPN   [phone]    Reviewed above information.  Agree with plan.    Dr Nicki Reaper

## 2015-12-10 ENCOUNTER — Telehealth: Payer: Self-pay | Admitting: Internal Medicine

## 2015-12-10 DIAGNOSIS — R0989 Other specified symptoms and signs involving the circulatory and respiratory systems: Secondary | ICD-10-CM

## 2015-12-10 MED ORDER — PANTOPRAZOLE SODIUM 40 MG PO TBEC: 40.0000 mg | DELAYED_RELEASE_TABLET | Freq: Every day | ORAL | Status: DC

## 2015-12-10 NOTE — Telephone Encounter (Signed)
Order placed for carotid ultrasound.  Also sent in rx for protonix #90 with 3 refills to [facility] per pts request.

## 2015-12-12 DIAGNOSIS — M7989 Other specified soft tissue disorders: Secondary | ICD-10-CM | POA: Diagnosis not present

## 2015-12-12 DIAGNOSIS — I890 Lymphedema, not elsewhere classified: Secondary | ICD-10-CM | POA: Diagnosis not present

## 2015-12-12 DIAGNOSIS — M79609 Pain in unspecified limb: Secondary | ICD-10-CM | POA: Diagnosis not present

## 2015-12-12 DIAGNOSIS — I6523 Occlusion and stenosis of bilateral carotid arteries: Secondary | ICD-10-CM | POA: Diagnosis not present

## 2015-12-12 DIAGNOSIS — I10 Essential (primary) hypertension: Secondary | ICD-10-CM | POA: Diagnosis not present

## 2015-12-22 ENCOUNTER — Other Ambulatory Visit (INDEPENDENT_AMBULATORY_CARE_PROVIDER_SITE_OTHER): Payer: Medicare Other

## 2015-12-22 DIAGNOSIS — I10 Essential (primary) hypertension: Secondary | ICD-10-CM | POA: Diagnosis not present

## 2015-12-22 DIAGNOSIS — R739 Hyperglycemia, unspecified: Secondary | ICD-10-CM | POA: Diagnosis not present

## 2015-12-22 DIAGNOSIS — E7800 Pure hypercholesterolemia, unspecified: Secondary | ICD-10-CM

## 2015-12-22 LAB — HEMOGLOBIN A1C: Hgb A1c MFr Bld: 5.7 % (ref 4.6–6.5)

## 2015-12-22 LAB — CBC WITH DIFFERENTIAL/PLATELET
BASOS PCT: 0.6 % (ref 0.0–3.0)
Basophils Absolute: 0 10*3/uL (ref 0.0–0.1)
EOS ABS: 0.3 10*3/uL (ref 0.0–0.7)
Eosinophils Relative: 4.6 % (ref 0.0–5.0)
HCT: 38.3 % (ref 36.0–46.0)
Hemoglobin: 12.9 g/dL (ref 12.0–15.0)
Lymphocytes Relative: 17.6 % (ref 12.0–46.0)
Lymphs Abs: 1.3 10*3/uL (ref 0.7–4.0)
MCHC: 33.6 g/dL (ref 30.0–36.0)
MCV: 92.5 fl (ref 78.0–100.0)
MONO ABS: 0.4 10*3/uL (ref 0.1–1.0)
Monocytes Relative: 5.3 % (ref 3.0–12.0)
NEUTROS ABS: 5.2 10*3/uL (ref 1.4–7.7)
Neutrophils Relative %: 71.9 % (ref 43.0–77.0)
PLATELETS: 208 10*3/uL (ref 150.0–400.0)
RBC: 4.14 Mil/uL (ref 3.87–5.11)
RDW: 13.4 % (ref 11.5–15.5)
WBC: 7.3 10*3/uL (ref 4.0–10.5)

## 2015-12-22 LAB — BASIC METABOLIC PANEL
BUN: 18 mg/dL (ref 6–23)
CO2: 28 meq/L (ref 19–32)
Calcium: 9.5 mg/dL (ref 8.4–10.5)
Chloride: 103 mEq/L (ref 96–112)
Creatinine, Ser: 1.18 mg/dL (ref 0.40–1.20)
GFR: 46.68 mL/min — AB (ref >60.00)
GLUCOSE: 107 mg/dL — AB (ref 70–99)
POTASSIUM: 4.7 meq/L (ref 3.5–5.1)
Sodium: 138 mEq/L (ref 135–145)

## 2015-12-22 LAB — HEPATIC FUNCTION PANEL
ALK PHOS: 82 U/L (ref 39–117)
ALT: 15 U/L (ref 0–35)
AST: 21 U/L (ref 0–37)
Albumin: 3.8 g/dL (ref 3.5–5.2)
BILIRUBIN DIRECT: 0 mg/dL (ref 0.0–0.3)
BILIRUBIN TOTAL: 0.5 mg/dL (ref 0.2–1.2)
TOTAL PROTEIN: 7.1 g/dL (ref 6.0–8.3)

## 2015-12-22 LAB — LIPID PANEL
CHOL/HDL RATIO: 2
CHOLESTEROL: 151 mg/dL (ref 0–200)
HDL: 67.6 mg/dL (ref >39.00)
LDL Cholesterol: 44 mg/dL (ref 0–99)
NonHDL: 83
TRIGLYCERIDES: 193 mg/dL — AB (ref 0.0–149.0)
VLDL: 38.6 mg/dL (ref 0.0–40.0)

## 2015-12-24 ENCOUNTER — Ambulatory Visit (INDEPENDENT_AMBULATORY_CARE_PROVIDER_SITE_OTHER): Payer: Medicare Other | Admitting: Internal Medicine

## 2015-12-24 ENCOUNTER — Encounter: Payer: Self-pay | Admitting: Internal Medicine

## 2015-12-24 VITALS — BP 120/68 | HR 77 | Temp 98.0°F | Resp 18 | Ht 62.0 in | Wt 158.5 lb

## 2015-12-24 DIAGNOSIS — R0989 Other specified symptoms and signs involving the circulatory and respiratory systems: Secondary | ICD-10-CM

## 2015-12-24 DIAGNOSIS — K219 Gastro-esophageal reflux disease without esophagitis: Secondary | ICD-10-CM | POA: Diagnosis not present

## 2015-12-24 DIAGNOSIS — E041 Nontoxic single thyroid nodule: Secondary | ICD-10-CM

## 2015-12-24 DIAGNOSIS — M25562 Pain in left knee: Secondary | ICD-10-CM

## 2015-12-24 DIAGNOSIS — E039 Hypothyroidism, unspecified: Secondary | ICD-10-CM

## 2015-12-24 DIAGNOSIS — K589 Irritable bowel syndrome without diarrhea: Secondary | ICD-10-CM

## 2015-12-24 DIAGNOSIS — I10 Essential (primary) hypertension: Secondary | ICD-10-CM

## 2015-12-24 DIAGNOSIS — E7800 Pure hypercholesterolemia, unspecified: Secondary | ICD-10-CM

## 2015-12-24 DIAGNOSIS — M7989 Other specified soft tissue disorders: Secondary | ICD-10-CM

## 2015-12-24 DIAGNOSIS — R739 Hyperglycemia, unspecified: Secondary | ICD-10-CM

## 2015-12-24 NOTE — Progress Notes (Signed)
[name], female   DOB: [birth year], 82 y.o.   MRN: [id number]   Subjective:    [name], female    DOB: 15-Jan-1932, 82 y.o.   MRN: [id number]  HPI  Patient here for a scheduled follow up.  She has been having problems with her left knee.  Saw Dr Roland Rack.  S/p injection.  Helped initially.  Now flared again.  Plans to f/u with ortho.  Tries to stay active.  No cardiac symptoms with increased activity or exertion.  No sob.  No acid reflux.  No abdominal pain or cramping.  Bowels stable.     Past Medical History  Diagnosis Date  . Hypercholesterolemia   . Hypertension   . IBS (irritable bowel syndrome)   . GERD (gastroesophageal reflux disease)   . Headache(784.0)     h/o recurrent vascular headaches  . Diverticulosis   . Urinary tract bacterial infections   . Dysphagia   . Hypothyroidism   . Neck fullness    Past Surgical History  Procedure Laterality Date  . Appendectomy    . Partial hysterectomy    . Cystoscopy      x2  . Cholecystectomy  1990  . Tonsillectomy    . Esophagogastroduodenoscopy (egd) with propofol N/A 08/28/2015    Procedure: ESOPHAGOGASTRODUODENOSCOPY (EGD) WITH PROPOFOL;  Surgeon: Hulen Luster, MD;  Location: [facility] ENDOSCOPY;  Service: Gastroenterology;  Laterality: N/A;   Family History  Problem Relation Age of Onset  . Heart disease Father   . Heart disease Mother   . Breast cancer Neg Hx   . Colon cancer Neg Hx    Social History   Social History  . Marital Status: Married    Spouse Name: N/A  . Number of Children: 2  . Years of Education: N/A   Occupational History  . retired    Social History Main Topics  . Smoking status: Never Smoker   . Smokeless tobacco: Never Used  . Alcohol Use: No  . Drug Use: No  . Sexual Activity: Not Currently   Other Topics Concern  . None   Social History Narrative    Outpatient Encounter Prescriptions as of 12/24/2015  Medication Sig  . amitriptyline (ELAVIL) 25 MG tablet  Take 25 mg by mouth at bedtime.  Marland Kitchen amLODipine (NORVASC) 10 MG tablet TAKE 1 TABLET DAILY  . aspirin 81 MG tablet Take 81 mg by mouth daily.  Marland Kitchen atorvastatin (LIPITOR) 40 MG tablet TAKE 1 TABLET DAILY  . estrogens, conjugated, (PREMARIN) 0.9 MG tablet Take 0.9 mg by mouth daily.   . metoprolol succinate (TOPROL-XL) 50 MG 24 hr tablet TAKE 1 TABLET TWICE A DAY  WITH OR IMMEDIATELY        FOLLOWING A MEAL  . pantoprazole (PROTONIX) 40 MG tablet Take 1 tablet (40 mg total) by mouth daily.  Marland Kitchen SYNTHROID 50 MCG tablet TAKE 1 TABLET DAILY BEFORE BREAKFAST  . [DISCONTINUED] amLODipine (NORVASC) 10 MG tablet TAKE 1 TABLET DAILY  . [DISCONTINUED] gabapentin (NEURONTIN) 300 MG capsule Take 300 mg by mouth 3 (three) times daily.  . [DISCONTINUED] metoprolol succinate (TOPROL-XL) 50 MG 24 hr tablet TAKE 1 TABLET TWICE A DAY  WITH OR IMMEDIATELY        FOLLOWING A MEAL   No facility-administered encounter medications on file as of 12/24/2015.    Review of Systems  Constitutional: Negative for appetite change and unexpected weight change.  HENT: Negative for congestion and sinus pressure.  Respiratory: Negative for cough, chest tightness and shortness of breath.   Cardiovascular: Negative for chest pain and palpitations.  Gastrointestinal: Negative for nausea, vomiting, abdominal pain and diarrhea.  Genitourinary: Negative for dysuria and difficulty urinating.  Musculoskeletal: Negative for myalgias.       Left knee pain as outlined.  Seeing ortho.   Skin: Negative for color change and rash.  Neurological: Negative for dizziness, light-headedness and headaches.  Psychiatric/Behavioral: Negative for dysphoric mood and agitation.       Objective:    Physical Exam  Constitutional: She appears well-developed and well-nourished. No distress.  HENT:  Nose: Nose normal.  Mouth/Throat: Oropharynx is clear and moist.  Neck: Neck supple. No thyromegaly present.  Cardiovascular: Normal rate and regular  rhythm.   Pulmonary/Chest: Breath sounds normal. No respiratory distress. She has no wheezes.  Abdominal: Soft. Bowel sounds are normal. There is no tenderness.  Musculoskeletal: She exhibits no edema or tenderness.  Lymphadenopathy:    She has no cervical adenopathy.  Skin: No rash noted. No erythema.  Psychiatric: She has a normal mood and affect. Her behavior is normal.    BP 120/68 mmHg  Pulse 77  Temp(Src) 98 F (36.7 C) (Oral)  Resp 18  Ht '5\' 2"'$  (1.575 m)  Wt 158 lb 8 oz (71.895 kg)  BMI 28.98 kg/m2  SpO2 97% Wt Readings from Last 3 Encounters:  12/24/15 158 lb 8 oz (71.895 kg)  10/27/15 156 lb 1.9 oz (70.816 kg)  08/25/15 157 lb 2 oz (71.271 kg)     Lab Results  Component Value Date   WBC 7.3 12/22/2015   HGB 12.9 12/22/2015   HCT 38.3 12/22/2015   PLT 208.0 12/22/2015   GLUCOSE 107* 12/22/2015   CHOL 151 12/22/2015   TRIG 193.0* 12/22/2015   HDL 67.60 12/22/2015   LDLDIRECT 64.9 12/08/2012   LDLCALC 44 12/22/2015   ALT 15 12/22/2015   AST 21 12/22/2015   NA 138 12/22/2015   K 4.7 12/22/2015   CL 103 12/22/2015   CREATININE 1.18 12/22/2015   BUN 18 12/22/2015   CO2 28 12/22/2015   TSH 2.78 08/21/2015   HGBA1C 5.7 12/22/2015       Assessment & Plan:   Problem List Items Addressed This Visit    GERD (gastroesophageal reflux disease)    On protonix.  Upper symptoms controlled.        Hypercholesterolemia    On lipitor.  Low cholesterol diet and exercise.  Follow lipid panel and liver function tests.        Relevant Orders   Lipid panel   Hepatic function panel   Hyperglycemia    Low carb diet and exercise.  Follow met b and a1c.        Relevant Orders   Hemoglobin A1c   Hypertension - Primary    Blood pressure under good control.  Continue same medication regimen.  Follow pressures.  Follow metabolic panel.        Relevant Orders   Basic metabolic panel   Hypothyroidism    On thyroid replacement.  Follow tsh.       IBS (irritable  bowel syndrome)    Colonoscopy 02/2012.  Bowels stable.       Left knee pain    Saw ortho.  S/p injection.  Helped initially.  Now with increased pain.  Plan f/u with ortho.        Leg swelling    Swelling better.  Compression hose.  Right carotid bruit    Had carotid ultrasound 06/24/14 as outlined in overview.  Just had f/u ultrasound.  Need results.        Thyroid nodule    Found on thyroid ultrasound - 44m nodule.  F/u thyroid ultrasound in one year from last.            SEinar Pheasant MD

## 2015-12-24 NOTE — Progress Notes (Signed)
Pre-visit discussion using our clinic review tool. No additional management support is needed unless otherwise documented below in the visit note.  

## 2015-12-29 ENCOUNTER — Telehealth: Payer: Self-pay | Admitting: *Deleted

## 2015-12-29 ENCOUNTER — Encounter: Payer: Self-pay | Admitting: Internal Medicine

## 2015-12-29 DIAGNOSIS — M25562 Pain in left knee: Secondary | ICD-10-CM | POA: Insufficient documentation

## 2015-12-29 NOTE — Assessment & Plan Note (Signed)
Blood pressure under good control.  Continue same medication regimen.  Follow pressures.  Follow metabolic panel.   

## 2015-12-29 NOTE — Assessment & Plan Note (Signed)
Swelling better.  Compression hose.

## 2015-12-29 NOTE — Assessment & Plan Note (Signed)
On protonix.  Upper symptoms controlled.  

## 2015-12-29 NOTE — Telephone Encounter (Signed)
Ultrasound received & placed in green folder

## 2015-12-29 NOTE — Assessment & Plan Note (Signed)
Found on thyroid ultrasound - 8mm nodule.  F/u thyroid ultrasound in one year from last.

## 2015-12-29 NOTE — Telephone Encounter (Signed)
-----   Message from Einar Pheasant, MD sent at 12/29/2015  5:36 AM EDT ----- Regarding: carotid ultrasound Need results of recent carotid ultrasound.  Had at [facility] vascular and vein.  Thanks    Dr Nicki Reaper

## 2015-12-29 NOTE — Telephone Encounter (Signed)
Notify pt that her carotid ultrasound reveals no significant stenosis.  No significant change from last ultrasound.

## 2015-12-29 NOTE — Assessment & Plan Note (Signed)
Low carb diet and exercise.  Follow met b and a1c.   

## 2015-12-29 NOTE — Telephone Encounter (Signed)
Records request faxed 

## 2015-12-29 NOTE — Assessment & Plan Note (Signed)
On lipitor.  Low cholesterol diet and exercise.  Follow lipid panel and liver function tests.   

## 2015-12-29 NOTE — Assessment & Plan Note (Signed)
Colonoscopy 02/2012.  Bowels stable.

## 2015-12-29 NOTE — Assessment & Plan Note (Signed)
Had carotid ultrasound 06/24/14 as outlined in overview.  Just had f/u ultrasound.  Need results.

## 2015-12-29 NOTE — Assessment & Plan Note (Signed)
Saw ortho.  S/p injection.  Helped initially.  Now with increased pain.  Plan f/u with ortho.

## 2015-12-29 NOTE — Assessment & Plan Note (Signed)
On thyroid replacement.  Follow tsh.  

## 2015-12-30 ENCOUNTER — Encounter: Payer: Self-pay | Admitting: *Deleted

## 2015-12-30 NOTE — Telephone Encounter (Signed)
Letter mailed

## 2016-01-23 DIAGNOSIS — M1712 Unilateral primary osteoarthritis, left knee: Secondary | ICD-10-CM | POA: Diagnosis not present

## 2016-01-30 DIAGNOSIS — M1712 Unilateral primary osteoarthritis, left knee: Secondary | ICD-10-CM | POA: Diagnosis not present

## 2016-02-06 DIAGNOSIS — M1712 Unilateral primary osteoarthritis, left knee: Secondary | ICD-10-CM | POA: Diagnosis not present

## 2016-02-25 ENCOUNTER — Other Ambulatory Visit: Payer: Self-pay | Admitting: Student

## 2016-02-25 DIAGNOSIS — M1712 Unilateral primary osteoarthritis, left knee: Secondary | ICD-10-CM

## 2016-03-18 ENCOUNTER — Ambulatory Visit
Admission: RE | Admit: 2016-03-18 | Discharge: 2016-03-18 | Disposition: A | Discharge status: Home / Self Care | Payer: Medicare Other | Source: Ambulatory Visit | Attending: Student | Admitting: Student

## 2016-03-18 DIAGNOSIS — S83242A Other tear of medial meniscus, current injury, left knee, initial encounter: Secondary | ICD-10-CM | POA: Diagnosis not present

## 2016-03-18 DIAGNOSIS — M1712 Unilateral primary osteoarthritis, left knee: Secondary | ICD-10-CM | POA: Diagnosis not present

## 2016-03-18 DIAGNOSIS — R609 Edema, unspecified: Secondary | ICD-10-CM | POA: Insufficient documentation

## 2016-03-18 DIAGNOSIS — M659 Synovitis and tenosynovitis, unspecified: Secondary | ICD-10-CM | POA: Insufficient documentation

## 2016-03-18 DIAGNOSIS — M25562 Pain in left knee: Secondary | ICD-10-CM | POA: Diagnosis not present

## 2016-03-18 DIAGNOSIS — M25462 Effusion, left knee: Secondary | ICD-10-CM | POA: Diagnosis not present

## 2016-03-18 DIAGNOSIS — S83232A Complex tear of medial meniscus, current injury, left knee, initial encounter: Secondary | ICD-10-CM | POA: Insufficient documentation

## 2016-03-29 DIAGNOSIS — M1712 Unilateral primary osteoarthritis, left knee: Secondary | ICD-10-CM | POA: Insufficient documentation

## 2016-03-29 DIAGNOSIS — M23204 Derangement of unspecified medial meniscus due to old tear or injury, left knee: Secondary | ICD-10-CM | POA: Diagnosis not present

## 2016-04-01 ENCOUNTER — Other Ambulatory Visit: Payer: Self-pay | Admitting: Internal Medicine

## 2016-04-07 ENCOUNTER — Ambulatory Visit
Admission: RE | Admit: 2016-04-07 | Discharge: 2016-04-07 | Disposition: A | Discharge status: Home / Self Care | Payer: Medicare Other | Source: Ambulatory Visit | Attending: Surgery | Admitting: Surgery

## 2016-04-07 ENCOUNTER — Encounter
Admission: RE | Admit: 2016-04-07 | Discharge: 2016-04-07 | Disposition: A | Discharge status: Home / Self Care | Payer: Medicare Other | Source: Ambulatory Visit | Attending: Surgery | Admitting: Surgery

## 2016-04-07 DIAGNOSIS — M23204 Derangement of unspecified medial meniscus due to old tear or injury, left knee: Secondary | ICD-10-CM | POA: Insufficient documentation

## 2016-04-07 DIAGNOSIS — K449 Diaphragmatic hernia without obstruction or gangrene: Secondary | ICD-10-CM | POA: Diagnosis not present

## 2016-04-07 DIAGNOSIS — I10 Essential (primary) hypertension: Secondary | ICD-10-CM

## 2016-04-07 DIAGNOSIS — M1712 Unilateral primary osteoarthritis, left knee: Secondary | ICD-10-CM | POA: Insufficient documentation

## 2016-04-07 DIAGNOSIS — Z01818 Encounter for other preprocedural examination: Secondary | ICD-10-CM | POA: Diagnosis not present

## 2016-04-07 HISTORY — DX: Nausea with vomiting, unspecified: R11.2

## 2016-04-07 HISTORY — DX: Other specified soft tissue disorders: M79.89

## 2016-04-07 HISTORY — DX: Adverse effect of unspecified anesthetic, initial encounter: T41.45XA

## 2016-04-07 HISTORY — DX: Other specified postprocedural states: Z98.890

## 2016-04-07 HISTORY — DX: Other complications of anesthesia, initial encounter: T88.59XA

## 2016-04-07 HISTORY — DX: Family history of other specified conditions: Z84.89

## 2016-04-07 LAB — SURGICAL PCR SCREEN
MRSA, PCR: NEGATIVE
STAPHYLOCOCCUS AUREUS: NEGATIVE

## 2016-04-07 LAB — URINALYSIS COMPLETE WITH MICROSCOPIC (ARMC ONLY)
BILIRUBIN URINE: NEGATIVE
GLUCOSE, UA: NEGATIVE mg/dL
Hgb urine dipstick: NEGATIVE
KETONES UR: NEGATIVE mg/dL
Leukocytes, UA: NEGATIVE
NITRITE: NEGATIVE
PH: 5 (ref 5.0–8.0)
Protein, ur: NEGATIVE mg/dL
Specific Gravity, Urine: 1.013 (ref 1.005–1.030)

## 2016-04-07 LAB — CBC
HEMATOCRIT: 39.5 % (ref 35.0–47.0)
Hemoglobin: 13.6 g/dL (ref 12.0–16.0)
MCH: 31.8 pg (ref 26.0–34.0)
MCHC: 34.4 g/dL (ref 32.0–36.0)
MCV: 92.3 fL (ref 80.0–100.0)
PLATELETS: 211 10*3/uL (ref 150–440)
RBC: 4.28 MIL/uL (ref 3.80–5.20)
RDW: 12.5 % (ref 11.5–14.5)
WBC: 8.3 10*3/uL (ref 3.6–11.0)

## 2016-04-07 LAB — BASIC METABOLIC PANEL
ANION GAP: 9 (ref 5–15)
BUN: 18 mg/dL (ref 6–20)
CO2: 23 mmol/L (ref 22–32)
Calcium: 9.4 mg/dL (ref 8.9–10.3)
Chloride: 104 mmol/L (ref 101–111)
Creatinine, Ser: 1.2 mg/dL — ABNORMAL HIGH (ref 0.44–1.00)
GFR, EST AFRICAN AMERICAN: 48 mL/min — AB (ref >60)
GFR, EST NON AFRICAN AMERICAN: 41 mL/min — AB (ref >60)
GLUCOSE: 128 mg/dL — AB (ref 65–99)
POTASSIUM: 3.8 mmol/L (ref 3.5–5.1)
Sodium: 136 mmol/L (ref 135–145)

## 2016-04-07 LAB — TYPE AND SCREEN
ABO/RH(D): O POS
ANTIBODY SCREEN: NEGATIVE

## 2016-04-07 LAB — PROTIME-INR
INR: 1.03
Prothrombin Time: 13.7 seconds (ref 11.4–15.0)

## 2016-04-07 NOTE — Patient Instructions (Signed)
  Your procedure is scheduled on: Thursday April 15, 2016. Report to Same Day Surgery. To find out your arrival time please call [phone] between 1PM - 3PM on Wednesday April 14, 2016.  Remember: Instructions that are not followed completely may result in serious medical risk, up to and including death, or upon the discretion of your surgeon and anesthesiologist your surgery may need to be rescheduled.    _x___ 1. Do not eat food or drink liquids after midnight. No gum chewing or  hard candies.     _x___ 2. No Alcohol for 24 hours before or after surgery.   ____ 3. Bring all medications with you on the day of surgery if instructed.    __x__ 4. Notify your doctor if there is any change in your medical condition     (cold, fever, infections).     Do not wear jewelry, make-up, hairpins, clips or nail polish.  Do not wear lotions, powders, or perfumes. You may wear deodorant.  Do not shave 48 hours prior to surgery. Men may shave face and neck.  Do not bring valuables to the hospital.    [facility] is not responsible for any belongings or valuables.               Contacts, dentures or bridgework may not be worn into surgery.  Leave your suitcase in the car. After surgery it may be brought to your room.  For patients admitted to the hospital, discharge time is determined by your treatment team.   Patients discharged the day of surgery will not be allowed to drive home.    Please read over the following fact sheets that you were given:   [facility] Preparing for Surgery  _x___ Take these medicines the morning of surgery with A SIP OF WATER:    1. metoprolol succinate (TOPROL-XL)  2. pantoprazole (PROTONIX)  3. SYNTHROID   ____ Fleet Enema (as directed)   _x___ Use CHG Soap as directed on instruction sheet  ____ Use inhalers on the day of surgery and bring to hospital day of surgery  ____ Stop metformin 2 days prior to surgery    ____ Take 1/2 of usual insulin dose the  night before surgery and none on the morning of          surgery.   ____ Stop aspirin on 04/05/16.  _x___ Stop Anti-inflammatories such as Advil, Aleve, Ibuprofen, Motrin, Naproxen, Naprosyn, Goodies powders or aspirin products.OK to take tylenol.  _x___ Stop supplements:Vitamin D, Cholecalciferol, until after surgery.    ____ Bring C-Pap to the hospital.

## 2016-04-08 NOTE — Pre-Procedure Instructions (Signed)
MET B COMPARED WITH 12/22/15 RESULTS AND PREVIOUS, EGFR STABLE

## 2016-04-15 ENCOUNTER — Inpatient Hospital Stay: Payer: Medicare Other | Admitting: Anesthesiology

## 2016-04-15 ENCOUNTER — Encounter
Admission: RE | Disposition: A | Discharge status: Home / Self Care | Payer: Self-pay | Source: Ambulatory Visit | Attending: Surgery

## 2016-04-15 ENCOUNTER — Encounter: Payer: Self-pay | Admitting: *Deleted

## 2016-04-15 ENCOUNTER — Inpatient Hospital Stay
Admission: RE | Admit: 2016-04-15 | Discharge: 2016-04-16 | DRG: 470 | Disposition: A | Discharge status: Home Health | Payer: Medicare Other | Source: Ambulatory Visit | Attending: Surgery | Admitting: Surgery

## 2016-04-15 ENCOUNTER — Inpatient Hospital Stay: Payer: Medicare Other

## 2016-04-15 DIAGNOSIS — Z91041 Radiographic dye allergy status: Secondary | ICD-10-CM | POA: Diagnosis not present

## 2016-04-15 DIAGNOSIS — Z882 Allergy status to sulfonamides status: Secondary | ICD-10-CM | POA: Diagnosis not present

## 2016-04-15 DIAGNOSIS — I10 Essential (primary) hypertension: Secondary | ICD-10-CM | POA: Diagnosis present

## 2016-04-15 DIAGNOSIS — Z96652 Presence of left artificial knee joint: Secondary | ICD-10-CM

## 2016-04-15 DIAGNOSIS — M23205 Derangement of unspecified medial meniscus due to old tear or injury, unspecified knee: Secondary | ICD-10-CM | POA: Diagnosis present

## 2016-04-15 DIAGNOSIS — S83242A Other tear of medial meniscus, current injury, left knee, initial encounter: Secondary | ICD-10-CM | POA: Diagnosis not present

## 2016-04-15 DIAGNOSIS — Z808 Family history of malignant neoplasm of other organs or systems: Secondary | ICD-10-CM | POA: Diagnosis not present

## 2016-04-15 DIAGNOSIS — Z79899 Other long term (current) drug therapy: Secondary | ICD-10-CM | POA: Diagnosis not present

## 2016-04-15 DIAGNOSIS — Z9049 Acquired absence of other specified parts of digestive tract: Secondary | ICD-10-CM | POA: Diagnosis not present

## 2016-04-15 DIAGNOSIS — Z87891 Personal history of nicotine dependence: Secondary | ICD-10-CM | POA: Diagnosis not present

## 2016-04-15 DIAGNOSIS — M1712 Unilateral primary osteoarthritis, left knee: Principal | ICD-10-CM | POA: Diagnosis present

## 2016-04-15 DIAGNOSIS — E039 Hypothyroidism, unspecified: Secondary | ICD-10-CM | POA: Diagnosis present

## 2016-04-15 DIAGNOSIS — K219 Gastro-esophageal reflux disease without esophagitis: Secondary | ICD-10-CM | POA: Diagnosis present

## 2016-04-15 DIAGNOSIS — Z9889 Other specified postprocedural states: Secondary | ICD-10-CM | POA: Insufficient documentation

## 2016-04-15 DIAGNOSIS — Z7982 Long term (current) use of aspirin: Secondary | ICD-10-CM

## 2016-04-15 DIAGNOSIS — Z888 Allergy status to other drugs, medicaments and biological substances status: Secondary | ICD-10-CM | POA: Diagnosis not present

## 2016-04-15 DIAGNOSIS — Z471 Aftercare following joint replacement surgery: Secondary | ICD-10-CM | POA: Diagnosis not present

## 2016-04-15 DIAGNOSIS — Z853 Personal history of malignant neoplasm of breast: Secondary | ICD-10-CM

## 2016-04-15 DIAGNOSIS — Z8249 Family history of ischemic heart disease and other diseases of the circulatory system: Secondary | ICD-10-CM | POA: Diagnosis not present

## 2016-04-15 HISTORY — PX: PARTIAL KNEE ARTHROPLASTY: SHX2174

## 2016-04-15 SURGERY — ARTHROPLASTY, KNEE, UNICOMPARTMENTAL
Anesthesia: Spinal | Laterality: Left | Wound class: Clean

## 2016-04-15 MED ORDER — AMITRIPTYLINE HCL 25 MG PO TABS
25.0000 mg | ORAL_TABLET | Freq: Every day | ORAL | Status: DC
  Administered 2016-04-15: 25 mg via ORAL
  Filled 2016-04-15: qty 1

## 2016-04-15 MED ORDER — AMLODIPINE BESYLATE 10 MG PO TABS
10.0000 mg | ORAL_TABLET | Freq: Every day | ORAL | Status: DC
  Administered 2016-04-15: 10 mg via ORAL
  Filled 2016-04-15 (×2): qty 1

## 2016-04-15 MED ORDER — DIPHENHYDRAMINE HCL 12.5 MG/5ML PO ELIX: 12.5000 mg | ORAL_SOLUTION | ORAL | Status: DC | PRN

## 2016-04-15 MED ORDER — KETAMINE HCL 50 MG/ML IJ SOLN
INTRAMUSCULAR | Status: DC | PRN
  Administered 2016-04-15: 1.5 mg via INTRAMUSCULAR

## 2016-04-15 MED ORDER — NEOMYCIN-POLYMYXIN B GU 40-200000 IR SOLN
Status: DC | PRN
  Administered 2016-04-15: 16 mL

## 2016-04-15 MED ORDER — ACETAMINOPHEN 325 MG PO TABS: 650.0000 mg | ORAL_TABLET | Freq: Four times a day (QID) | ORAL | Status: DC | PRN

## 2016-04-15 MED ORDER — HYDROMORPHONE HCL 1 MG/ML IJ SOLN: 0.5000 mg | INTRAMUSCULAR | Status: DC | PRN

## 2016-04-15 MED ORDER — ACETAMINOPHEN 650 MG RE SUPP: 650.0000 mg | Freq: Four times a day (QID) | RECTAL | Status: DC | PRN

## 2016-04-15 MED ORDER — SODIUM CHLORIDE 0.9 % IV SOLN
INTRAVENOUS | Status: DC | PRN
  Administered 2016-04-15: 60 mL

## 2016-04-15 MED ORDER — ATORVASTATIN CALCIUM 20 MG PO TABS
40.0000 mg | ORAL_TABLET | Freq: Every day | ORAL | Status: DC
  Filled 2016-04-15: qty 2

## 2016-04-15 MED ORDER — TRANEXAMIC ACID 1000 MG/10ML IV SOLN
INTRAVENOUS | Status: AC
  Filled 2016-04-15: qty 10

## 2016-04-15 MED ORDER — PROPOFOL 500 MG/50ML IV EMUL
INTRAVENOUS | Status: DC | PRN
  Administered 2016-04-15: 40 ug/kg/min via INTRAVENOUS
  Administered 2016-04-15: 60 ug/kg/min via INTRAVENOUS

## 2016-04-15 MED ORDER — ESTROGENS CONJUGATED 0.3 MG PO TABS
0.9000 mg | ORAL_TABLET | Freq: Every day | ORAL | Status: DC
  Administered 2016-04-15: 0.9 mg via ORAL
  Filled 2016-04-15 (×2): qty 3

## 2016-04-15 MED ORDER — CALCIUM CARBONATE ANTACID 500 MG PO CHEW
600.0000 mg | CHEWABLE_TABLET | Freq: Every day | ORAL | Status: DC
  Filled 2016-04-15 (×2): qty 3

## 2016-04-15 MED ORDER — KETOROLAC TROMETHAMINE 15 MG/ML IJ SOLN
15.0000 mg | Freq: Once | INTRAMUSCULAR | Status: AC
  Administered 2016-04-15: 15 mg via INTRAVENOUS
  Filled 2016-04-15: qty 1

## 2016-04-15 MED ORDER — FENTANYL CITRATE (PF) 100 MCG/2ML IJ SOLN
25.0000 ug | INTRAMUSCULAR | Status: DC | PRN
  Administered 2016-04-15 (×2): 25 ug via INTRAVENOUS

## 2016-04-15 MED ORDER — ONDANSETRON HCL 4 MG/2ML IJ SOLN
4.0000 mg | Freq: Four times a day (QID) | INTRAMUSCULAR | Status: DC | PRN
  Administered 2016-04-15 (×2): 4 mg via INTRAVENOUS
  Filled 2016-04-15 (×2): qty 2

## 2016-04-15 MED ORDER — CEFAZOLIN SODIUM-DEXTROSE 2-4 GM/100ML-% IV SOLN
2.0000 g | Freq: Once | INTRAVENOUS | Status: AC
Start: 2016-04-15 — End: 2016-04-15
  Administered 2016-04-15: 2 g via INTRAVENOUS

## 2016-04-15 MED ORDER — EPHEDRINE SULFATE 50 MG/ML IJ SOLN
INTRAMUSCULAR | Status: DC | PRN
  Administered 2016-04-15: 5 mg via INTRAVENOUS

## 2016-04-15 MED ORDER — SODIUM CHLORIDE 0.9 % IV SOLN
INTRAVENOUS | Status: DC | PRN
  Administered 2016-04-15: 4 ug/kg/min via INTRAVENOUS

## 2016-04-15 MED ORDER — SODIUM CHLORIDE 0.9 % IJ SOLN
INTRAMUSCULAR | Status: AC
  Filled 2016-04-15: qty 50

## 2016-04-15 MED ORDER — BUPIVACAINE-EPINEPHRINE (PF) 0.5% -1:200000 IJ SOLN
INTRAMUSCULAR | Status: AC
  Filled 2016-04-15: qty 30

## 2016-04-15 MED ORDER — KETOROLAC TROMETHAMINE 30 MG/ML IJ SOLN
INTRAMUSCULAR | Status: AC
  Filled 2016-04-15: qty 1

## 2016-04-15 MED ORDER — CEFAZOLIN SODIUM-DEXTROSE 2-4 GM/100ML-% IV SOLN
INTRAVENOUS | Status: AC
  Filled 2016-04-15: qty 100

## 2016-04-15 MED ORDER — BUPIVACAINE LIPOSOME 1.3 % IJ SUSP
INTRAMUSCULAR | Status: AC
  Filled 2016-04-15: qty 20

## 2016-04-15 MED ORDER — MAGNESIUM HYDROXIDE 400 MG/5ML PO SUSP: 30.0000 mL | Freq: Every day | ORAL | Status: DC | PRN

## 2016-04-15 MED ORDER — LEVOTHYROXINE SODIUM 50 MCG PO TABS
50.0000 ug | ORAL_TABLET | Freq: Every day | ORAL | Status: DC
  Administered 2016-04-16: 50 ug via ORAL
  Filled 2016-04-15 (×2): qty 1

## 2016-04-15 MED ORDER — METOCLOPRAMIDE HCL 10 MG PO TABS
5.0000 mg | ORAL_TABLET | Freq: Three times a day (TID) | ORAL | Status: DC | PRN
Start: 2016-04-15 — End: 2016-04-16
  Administered 2016-04-15: 10 mg via ORAL
  Filled 2016-04-15 (×2): qty 1

## 2016-04-15 MED ORDER — FLEET ENEMA 7-19 GM/118ML RE ENEM: 1.0000 | ENEMA | Freq: Once | RECTAL | Status: DC | PRN

## 2016-04-15 MED ORDER — KCL IN DEXTROSE-NACL 20-5-0.9 MEQ/L-%-% IV SOLN
INTRAVENOUS | Status: DC
  Administered 2016-04-15 – 2016-04-16 (×2): via INTRAVENOUS
  Filled 2016-04-15 (×4): qty 1000

## 2016-04-15 MED ORDER — FENTANYL CITRATE (PF) 100 MCG/2ML IJ SOLN
INTRAMUSCULAR | Status: DC | PRN
  Administered 2016-04-15: 100 ug via INTRAVENOUS

## 2016-04-15 MED ORDER — ACETAMINOPHEN 500 MG PO TABS
1000.0000 mg | ORAL_TABLET | Freq: Four times a day (QID) | ORAL | Status: AC
  Administered 2016-04-15 – 2016-04-16 (×3): 1000 mg via ORAL
  Filled 2016-04-15 (×4): qty 2

## 2016-04-15 MED ORDER — ASPIRIN 81 MG PO CHEW
81.0000 mg | CHEWABLE_TABLET | Freq: Every day | ORAL | Status: DC
  Filled 2016-04-15: qty 1

## 2016-04-15 MED ORDER — PANTOPRAZOLE SODIUM 40 MG PO TBEC
40.0000 mg | DELAYED_RELEASE_TABLET | Freq: Every day | ORAL | Status: DC
  Filled 2016-04-15: qty 1

## 2016-04-15 MED ORDER — OXYCODONE HCL 5 MG PO TABS
5.0000 mg | ORAL_TABLET | ORAL | Status: DC | PRN
  Administered 2016-04-15 – 2016-04-16 (×4): 5 mg via ORAL
  Filled 2016-04-15 (×4): qty 1

## 2016-04-15 MED ORDER — BUPIVACAINE-EPINEPHRINE (PF) 0.5% -1:200000 IJ SOLN
INTRAMUSCULAR | Status: DC | PRN
  Administered 2016-04-15: 30 mL via PERINEURAL

## 2016-04-15 MED ORDER — PROPOFOL 10 MG/ML IV BOLUS
INTRAVENOUS | Status: DC | PRN
  Administered 2016-04-15: 15 mg via INTRAVENOUS

## 2016-04-15 MED ORDER — DOCUSATE SODIUM 100 MG PO CAPS
100.0000 mg | ORAL_CAPSULE | Freq: Two times a day (BID) | ORAL | Status: DC
  Administered 2016-04-15 (×2): 100 mg via ORAL
  Filled 2016-04-15 (×3): qty 1

## 2016-04-15 MED ORDER — KETOROLAC TROMETHAMINE 15 MG/ML IJ SOLN
7.5000 mg | Freq: Four times a day (QID) | INTRAMUSCULAR | Status: AC
  Administered 2016-04-15 – 2016-04-16 (×4): 7.5 mg via INTRAVENOUS
  Filled 2016-04-15 (×4): qty 1

## 2016-04-15 MED ORDER — BISACODYL 10 MG RE SUPP
10.0000 mg | Freq: Every day | RECTAL | Status: DC | PRN
Start: 2016-04-15 — End: 2016-04-16

## 2016-04-15 MED ORDER — CEFAZOLIN SODIUM-DEXTROSE 2-4 GM/100ML-% IV SOLN
2.0000 g | Freq: Four times a day (QID) | INTRAVENOUS | Status: AC
  Administered 2016-04-15 – 2016-04-16 (×3): 2 g via INTRAVENOUS
  Filled 2016-04-15 (×3): qty 100

## 2016-04-15 MED ORDER — FENTANYL CITRATE (PF) 100 MCG/2ML IJ SOLN
INTRAMUSCULAR | Status: AC
  Administered 2016-04-15: 25 ug via INTRAVENOUS
  Filled 2016-04-15: qty 2

## 2016-04-15 MED ORDER — METOPROLOL SUCCINATE ER 50 MG PO TB24
50.0000 mg | ORAL_TABLET | Freq: Every day | ORAL | Status: DC
  Administered 2016-04-16: 50 mg via ORAL
  Filled 2016-04-15: qty 1

## 2016-04-15 MED ORDER — ONDANSETRON HCL 4 MG PO TABS
4.0000 mg | ORAL_TABLET | Freq: Four times a day (QID) | ORAL | Status: DC | PRN
  Filled 2016-04-15: qty 1

## 2016-04-15 MED ORDER — LACTATED RINGERS IV SOLN
INTRAVENOUS | Status: DC
  Administered 2016-04-15: 50 mL/h via INTRAVENOUS
  Administered 2016-04-15: 10:00:00 via INTRAVENOUS

## 2016-04-15 MED ORDER — TRANEXAMIC ACID 1000 MG/10ML IV SOLN
INTRAVENOUS | Status: DC | PRN
  Administered 2016-04-15: 1000 mg via TOPICAL

## 2016-04-15 MED ORDER — METOCLOPRAMIDE HCL 5 MG/ML IJ SOLN
5.0000 mg | Freq: Three times a day (TID) | INTRAMUSCULAR | Status: DC | PRN
  Administered 2016-04-16: 5 mg via INTRAVENOUS
  Administered 2016-04-16: 10 mg via INTRAVENOUS
  Filled 2016-04-15 (×2): qty 2

## 2016-04-15 MED ORDER — SODIUM CHLORIDE 0.9 % IV SOLN
INTRAVENOUS | Status: DC | PRN
  Administered 2016-04-15: 5 ug/min via INTRAVENOUS

## 2016-04-15 MED ORDER — ENOXAPARIN SODIUM 40 MG/0.4ML ~~LOC~~ SOLN
40.0000 mg | SUBCUTANEOUS | Status: DC
  Administered 2016-04-16: 40 mg via SUBCUTANEOUS
  Filled 2016-04-15: qty 0.4

## 2016-04-15 MED ORDER — NEOMYCIN-POLYMYXIN B GU 40-200000 IR SOLN
Status: AC
  Filled 2016-04-15: qty 20

## 2016-04-15 MED ORDER — VITAMIN D 1000 UNITS PO TABS
1000.0000 [IU] | ORAL_TABLET | Freq: Every day | ORAL | Status: DC
  Filled 2016-04-15: qty 1

## 2016-04-15 MED ORDER — ONDANSETRON HCL 4 MG/2ML IJ SOLN: 4.0000 mg | Freq: Once | INTRAMUSCULAR | Status: DC | PRN

## 2016-04-15 SURGICAL SUPPLY — 56 items
BANDAGE ACE 6X5 VEL STRL LF (GAUZE/BANDAGES/DRESSINGS) ×3 IMPLANT
BONE CEMENT PALACOSE (Orthopedic Implant) ×3 IMPLANT
CANISTER SUCT 1200ML W/VALVE (MISCELLANEOUS) ×3 IMPLANT
CANISTER SUCT 3000ML (MISCELLANEOUS) ×3 IMPLANT
CAPT KNEE PARTIAL 2 ×3 IMPLANT
CATH FOL LEG HOLDER (MISCELLANEOUS) ×3 IMPLANT
CATH TRAY METER 16FR LF (MISCELLANEOUS) ×3 IMPLANT
CEMENT BONE PALACOSE (Orthopedic Implant) ×1 IMPLANT
CHLORAPREP W/TINT 26ML (MISCELLANEOUS) ×6 IMPLANT
COOLER POLAR GLACIER W/PUMP (MISCELLANEOUS) ×3 IMPLANT
COVER MAYO STAND STRL (DRAPES) ×3 IMPLANT
CUFF TOURN 30 STER DUAL PORT (MISCELLANEOUS) ×3 IMPLANT
DRAPE C-ARM XRAY 36X54 (DRAPES) IMPLANT
DRSG OPSITE POSTOP 4X12 (GAUZE/BANDAGES/DRESSINGS) ×3 IMPLANT
DRSG OPSITE POSTOP 4X14 (GAUZE/BANDAGES/DRESSINGS) ×3 IMPLANT
DRSG OPSITE POSTOP 4X6 (GAUZE/BANDAGES/DRESSINGS) ×3 IMPLANT
ELECT CAUTERY BLADE 6.4 (BLADE) ×3 IMPLANT
ELECT REM PT RETURN 9FT ADLT (ELECTROSURGICAL) ×3
ELECTRODE REM PT RTRN 9FT ADLT (ELECTROSURGICAL) ×1 IMPLANT
GAUZE PETRO XEROFOAM 1X8 (MISCELLANEOUS) ×3 IMPLANT
GAUZE SPONGE 4X4 12PLY STRL (GAUZE/BANDAGES/DRESSINGS) ×3 IMPLANT
GLOVE BIO SURGEON STRL SZ7.5 (GLOVE) ×6 IMPLANT
GLOVE BIO SURGEON STRL SZ8 (GLOVE) ×6 IMPLANT
GLOVE BIOGEL PI IND STRL 8 (GLOVE) ×1 IMPLANT
GLOVE BIOGEL PI INDICATOR 8 (GLOVE) ×2
GLOVE INDICATOR 8.0 STRL GRN (GLOVE) ×3 IMPLANT
GOWN STRL REUS W/ TWL LRG LVL3 (GOWN DISPOSABLE) ×2 IMPLANT
GOWN STRL REUS W/ TWL XL LVL3 (GOWN DISPOSABLE) ×1 IMPLANT
GOWN STRL REUS W/TWL LRG LVL3 (GOWN DISPOSABLE) ×4
GOWN STRL REUS W/TWL XL LVL3 (GOWN DISPOSABLE) ×2
HANDPIECE INTERPULSE COAX TIP (DISPOSABLE) ×2
HOOD PEEL AWAY FLYTE STAYCOOL (MISCELLANEOUS) ×9 IMPLANT
KIT RM TURNOVER STRD PROC AR (KITS) ×3 IMPLANT
MAT BLUE FLOOR 46X72 FLO (MISCELLANEOUS) IMPLANT
NDL SAFETY 18GX1.5 (NEEDLE) ×3 IMPLANT
NEEDLE SPNL 20GX3.5 QUINCKE YW (NEEDLE) ×3 IMPLANT
NS IRRIG 1000ML POUR BTL (IV SOLUTION) ×3 IMPLANT
PACK BLADE SAW RECIP 70 3 PT (BLADE) ×3 IMPLANT
PACK TOTAL KNEE (MISCELLANEOUS) ×3 IMPLANT
PAD WRAPON POLAR KNEE (MISCELLANEOUS) ×1 IMPLANT
SET HNDPC FAN SPRY TIP SCT (DISPOSABLE) ×1 IMPLANT
SOL .9 NS 3000ML IRR  AL (IV SOLUTION) ×2
SOL .9 NS 3000ML IRR UROMATIC (IV SOLUTION) ×1 IMPLANT
SPONGE XRAY 4X4 16PLY STRL (MISCELLANEOUS) ×3 IMPLANT
STAPLER SKIN PROX 35W (STAPLE) ×3 IMPLANT
STRAP SAFETY BODY (MISCELLANEOUS) ×3 IMPLANT
SUCTION FRAZIER HANDLE 10FR (MISCELLANEOUS) ×2
SUCTION TUBE FRAZIER 10FR DISP (MISCELLANEOUS) ×1 IMPLANT
SUT VIC AB 2-0 CT1 27 (SUTURE) ×8
SUT VIC AB 2-0 CT1 TAPERPNT 27 (SUTURE) ×4 IMPLANT
SYR 20CC LL (SYRINGE) ×3 IMPLANT
SYR 30ML LL (SYRINGE) ×9 IMPLANT
SYRINGE 10CC LL (SYRINGE) ×3 IMPLANT
TAPE TRANSPORE STRL 2 31045 (GAUZE/BANDAGES/DRESSINGS) ×3 IMPLANT
WRAPON POLAR PAD KNEE (MISCELLANEOUS) ×3
ZIMMER COMPACT MIXING SYSTEM COMPACT VACCUUM CEMEN MIXING SYSTERM ×3 IMPLANT

## 2016-04-15 NOTE — H&P (Signed)
Paper H&P to be scanned into permanent record. H&P reviewed. No changes. 

## 2016-04-15 NOTE — Anesthesia Preprocedure Evaluation (Signed)
Anesthesia Evaluation  Patient identified by MRN, date of birth, ID band Patient awake    Reviewed: Allergy & Precautions, NPO status   History of Anesthesia Complications (+) PONV  Airway Mallampati: III       Dental  (+) Teeth Intact   Pulmonary neg pulmonary ROS,    breath sounds clear to auscultation       Cardiovascular Exercise Tolerance: Good hypertension, Pt. on home beta blockers  Rhythm:Regular     Neuro/Psych  Headaches,    GI/Hepatic Neg liver ROS, GERD  ,  Endo/Other  Hypothyroidism   Renal/GU negative Renal ROS     Musculoskeletal   Abdominal Normal abdominal exam  (+)   Peds  Hematology negative hematology ROS (+)   Anesthesia Other Findings   Reproductive/Obstetrics                             Anesthesia Physical Anesthesia Plan  ASA: II  Anesthesia Plan: Spinal   Post-op Pain Management:    Induction: Intravenous  Airway Management Planned: Natural Airway and Nasal Cannula  Additional Equipment:   Intra-op Plan:   Post-operative Plan:   Informed Consent: I have reviewed the patients History and Physical, chart, labs and discussed the procedure including the risks, benefits and alternatives for the proposed anesthesia with the patient or authorized representative who has indicated his/her understanding and acceptance.     Plan Discussed with: CRNA  Anesthesia Plan Comments:         Anesthesia Quick Evaluation

## 2016-04-15 NOTE — Progress Notes (Signed)
Physical Therapy Evaluation Patient Details Name: Elizabeth Henderson MRN: [id number] DOB: 11/19/1931 Today's Date: 04/15/2016   History of Present Illness  Pt is am 82 y/o female s/p L partial knee replacement. WBAT  Clinical Impression  Pt is a pleasant and motivated 82 y/o female POD#0 s/p L partial knee replacement. PLOF: Pt was independent with all household and community ambulation with no AD. Pt was also independent with ADLs. . Pt reports she was still driving prior to surgery. Instructed pt on WB status. Pt able to perform 10 SLRs, no KI needed. Pt requires min assist for bed mobility, transfers, and ambulation. Pt requires min verbal cueing for hand and foot placement with sit/stand and safe ambulation with RW. Pt reports nausea upon standing, RN notified and medication was given during session. No light headedness or dizziness noted. Pt will benefit from skilled PT in order to improve strength, ROM, functional mobility, and safety with DME use. At this time pt is appropriate for HHPT to achieve goals.    Follow Up Recommendations Home health PT    Equipment Recommendations  Rolling walker with 5" wheels    Recommendations for Other Services       Precautions / Restrictions Precautions Precautions: Fall;Knee Precaution Booklet Issued: No Restrictions Weight Bearing Restrictions: Yes LLE Weight Bearing: Weight bearing as tolerated      Mobility  Bed Mobility Overal bed mobility: Needs Assistance Bed Mobility: Supine to Sit     Supine to sit: Min assist     General bed mobility comments: Pt uses B UE support to push through bed and pull on bed rail to sit on EOB. Pt able to scoot to EOB independently.  Transfers Overall transfer level: Needs assistance Equipment used: Rolling walker (2 wheeled) Transfers: Sit to/from Stand Sit to Stand: Min assist         General transfer comment: Verbal cueing for hand and foot placement for safe sit/stand technique with RW.  Verbal and tactile cueing to reach back for arm rests, lean forward, and prop foot out before sitting.  Ambulation/Gait Ambulation/Gait assistance: Min assist Ambulation Distance (Feet): 3 Feet Assistive device: Rolling walker (2 wheeled) Gait Pattern/deviations: WFL(Within Functional Limits);Step-to pattern;Decreased stride length   Gait velocity interpretation: Below normal speed for age/gender General Gait Details: Pt demonstrates step-to gait pattern. Verbal cueing to keep RW close to body.  Stairs            Wheelchair Mobility    Modified Rankin (Stroke Patients Only)       Balance Overall balance assessment: Needs assistance Sitting-balance support: Feet supported;No upper extremity supported Sitting balance-Leahy Scale: Good     Standing balance support: Bilateral upper extremity supported Standing balance-Leahy Scale: Good Standing balance comment: Pt was able to maintain standing balance with B UE support on RW.                             Pertinent Vitals/Pain Pain Assessment: 0-10 Pain Score: 2  Pain Location: L knee Pain Intervention(s): Monitored during session;Ice applied    Home Living Family/patient expects to be discharged to:: Private residence Living Arrangements: Spouse/significant other Available Help at Discharge: Family Type of Home: House Home Access: Stairs to enter Entrance Stairs-Rails: Right Entrance Stairs-Number of Steps: 2 Home Layout: One level Home Equipment: None Additional Comments: Pt states she has a comfort toilet seat and that it is raised. She has both a shower and tub, only uses shower (  6 inch threshold)    Prior Function Level of Independence: Independent         Comments: Prior to surgery, pt was independent with all household and community ambulation. Indepdenent with ADLs. Pt was still driving and shopping.      Hand Dominance        Extremity/Trunk Assessment   Upper Extremity Assessment:  Overall WFL for tasks assessed (Pt uses B UE support on RW)           Lower Extremity Assessment: Generalized weakness (R LE: WFL. L LE at least 3/5)         Communication   Communication: No difficulties  Cognition Arousal/Alertness: Awake/alert Behavior During Therapy: WFL for tasks assessed/performed Overall Cognitive Status: Within Functional Limits for tasks assessed                      General Comments      Exercises Total Joint Exercises Goniometric ROM: L knee AROM: 2-86 degrees Other Exercises Other Exercises: Supine ther ex: B ankle pumps, L quad sets, L hip abd/add, x 8 reps with verbal and visual cueing for proper technique with supervision. L SLRs x 10 reps (no KI needed).       Assessment/Plan    PT Assessment Patient needs continued PT services  PT Diagnosis Difficulty walking   PT Problem List Decreased strength;Decreased range of motion;Decreased activity tolerance;Decreased balance;Decreased mobility;Decreased knowledge of use of DME  PT Treatment Interventions DME instruction;Gait training;Stair training;Functional mobility training;Therapeutic activities;Therapeutic exercise;Balance training;Patient/family education   PT Goals (Current goals can be found in the Care Plan section) Acute Rehab PT Goals Patient Stated Goal: To return home PT Goal Formulation: With patient Time For Goal Achievement: 04/29/16 Potential to Achieve Goals: Good    Frequency BID   Barriers to discharge        Co-evaluation               End of Session Equipment Utilized During Treatment: Gait belt Activity Tolerance: Patient tolerated treatment well;Other (comment) (Pt limited due to nausea, secondary to medications) Patient left: in chair;with call bell/phone within reach;with chair alarm set;with nursing/sitter in room;with family/visitor present Nurse Communication: Mobility status         Time: 1425-1503 PT Time Calculation (min) (ACUTE  ONLY): 38 min   Charges:   PT Evaluation $PT Eval Moderate Complexity: 1 Procedure PT Treatments $Therapeutic Exercise: 8-22 mins   PT G Codes:        Georgina Pillion 2016-05-09, 4:57 PM Georgina Pillion, SPT [phone]

## 2016-04-15 NOTE — Discharge Summary (Signed)
Physician Discharge Summary  [name] MRN: [id number] DOB/AGE: 80/02/1932 18 y.o.  Admit date: 04/15/2016 Discharge date: 04/16/2016  Admission Diagnoses:  PRIMARY OSTEOARTHRITIS,DEGENERATIVE TEAR OF MEDIAL MENISCUS Left knee medial compartment osteoarthritis  Discharge Diagnoses: Patient Active Problem List   Diagnosis Date Noted  . Status post left partial knee replacement 04/15/2016  . Left knee pain 12/29/2015  . Thyroid nodule 08/31/2015  . Dysphagia 07/20/2015  . Hypothyroidism 12/28/2014  . Health care maintenance 12/28/2014  . Right carotid bruit 06/20/2014  . Hyperglycemia 12/21/2013  . Back pain 12/21/2013  . Leg swelling 02/18/2013  . Hypercholesterolemia 08/11/2012  . Hypertension 08/11/2012  . IBS (irritable bowel syndrome) 08/11/2012  . GERD (gastroesophageal reflux disease) 08/11/2012  Left knee medial compartment osteoarthritis  Past Medical History:  Diagnosis Date  . Cancer of left breast, stage 0 .0  . Complication of anesthesia   . Diverticulosis   . Dysphagia   . Family history of adverse reaction to anesthesia    brother had a hard time waking up with hernia surgery as a child.  Marland Kitchen GERD (gastroesophageal reflux disease)   . Headache(784.0)    h/o recurrent vascular headaches. no longer gets these  . Hypercholesterolemia   . Hypertension   . Hypothyroidism   . IBS (irritable bowel syndrome)   . Leg swelling   . Neck fullness 2016   dr. Nicki Reaper reviewed and found it to be nothing.  prinivil seemed to cause face and neck and tongue swelling  . PONV (postoperative nausea and vomiting)   . Urinary tract bacterial infections      Transfusion: None   Consultants (if any):   Discharged Condition: Improved  Hospital Course: Elizabeth Henderson is an 82 y.o. female who was admitted 04/15/2016 with a diagnosis of left knee medial compartment osteoarthritis and went to the operating room on 04/15/2016 and underwent the above named  procedures.    Surgeries: Procedure(s): UNICOMPARTMENTAL KNEE on 04/15/2016 Patient tolerated the surgery well. Taken to PACU where she was stabilized and then transferred to the orthopedic floor.  Started on Lovenox 40mg  q 24 hrs. Foot pumps applied bilaterally at 80 mm. Heels elevated on bed with rolled towels. No evidence of DVT. Negative Homan. Physical therapy started on day #1 for gait training and transfer. OT started day #1 for ADL and assisted devices.  Patient's IV and Foley were removed on POD1.  Implants: All-cemented Biomet Oxford system with an extra small femoral component, an "A" sized tibial tray, and a 3 mm meniscal bearing insert.  She was given perioperative antibiotics:  Anti-infectives    Start     Dose/Rate Route Frequency Ordered Stop   04/15/16 1400  ceFAZolin (ANCEF) IVPB 2g/100 mL premix     2 g 200 mL/hr over 30 Minutes Intravenous Every 6 hours 04/15/16 1051 04/16/16 0136   04/15/16 0603  ceFAZolin (ANCEF) 2-4 GM/100ML-% IVPB    Comments:  Veda Canning: cabinet override      04/15/16 0603 04/15/16 1814   04/15/16 0500  ceFAZolin (ANCEF) IVPB 2g/100 mL premix     2 g 200 mL/hr over 30 Minutes Intravenous  Once 04/15/16 0446 04/15/16 0800    .  She was given sequential compression devices, early ambulation, and Lovenox for DVT prophylaxis.  She benefited maximally from the hospital stay and there were no complications.    Recent vital signs:  Vitals:   04/16/16 0624 04/16/16 0750  BP: (!) 140/54 133/67  Pulse: 87 86  Resp: 18   Temp: 98.1 F (36.7 C) 97.7 F (36.5 C)    Recent laboratory studies:  Lab Results  Component Value Date   HGB 12.7 04/16/2016   HGB 13.6 04/07/2016   HGB 12.9 12/22/2015   Lab Results  Component Value Date   WBC 11.3 (H) 04/16/2016   PLT 173 04/16/2016   Lab Results  Component Value Date   INR 1.03 04/07/2016   Lab Results  Component Value Date   NA 136 04/16/2016   K 3.7 04/16/2016   CL 102  04/16/2016   CO2 26 04/16/2016   BUN 9 04/16/2016   CREATININE 0.94 04/16/2016   GLUCOSE 132 (H) 04/16/2016    Discharge Medications:     Medication List    TAKE these medications   amitriptyline 25 MG tablet Commonly known as:  ELAVIL Take 25 mg by mouth at bedtime.   amLODipine 10 MG tablet Commonly known as:  NORVASC TAKE 1 TABLET DAILY   aspirin 81 MG tablet Take 81 mg by mouth daily.   atorvastatin 40 MG tablet Commonly known as:  LIPITOR TAKE 1 TABLET DAILY   CALCIUM 600 PO Take 1 tablet by mouth daily.   enoxaparin 40 MG/0.4ML injection Commonly known as:  LOVENOX Inject 0.4 mLs (40 mg total) into the skin daily.   estrogens (conjugated) 0.9 MG tablet Commonly known as:  PREMARIN Take 0.9 mg by mouth daily.   metoprolol succinate 50 MG 24 hr tablet Commonly known as:  TOPROL-XL TAKE 1 TABLET TWICE A DAY  WITH OR IMMEDIATELY        FOLLOWING A MEAL   ondansetron 4 MG tablet Commonly known as:  ZOFRAN Take 1 tablet (4 mg total) by mouth every 8 (eight) hours as needed for nausea.   oxyCODONE 5 MG immediate release tablet Commonly known as:  Oxy IR/ROXICODONE Take 1-2 tablets (5-10 mg total) by mouth every 4 (four) hours as needed for breakthrough pain.   pantoprazole 40 MG tablet Commonly known as:  PROTONIX Take 1 tablet (40 mg total) by mouth daily.   SYNTHROID 50 MCG tablet Generic drug:  levothyroxine TAKE 1 TABLET DAILY BEFORE BREAKFAST   Vitamin D (Cholecalciferol) 1000 units Caps Take 1 capsule by mouth daily.       Diagnostic Studies: Dg Chest 2 View  Result Date: 04/07/2016 CLINICAL DATA:  Knee surgery.  Preoperative exam. EXAM: CHEST  2 VIEW COMPARISON:  No prior . FINDINGS: Mediastinum and hilar structures are normal. Prominence of sliding hiatal hernia. Prominent cardiac fat pads. Heart size normal. No focal infiltrate. No pleural effusion or pneumothorax. Biapical pleural thickening consistent scarring IMPRESSION: Prominent hiatal  hernia.  No acute cardiopulmonary disease. Electronically Signed   By: Marcello Moores  Register   On: 04/07/2016 15:44   Mr Knee Left  Wo Contrast  Result Date: 03/18/2016 CLINICAL DATA:  Left knee pain posteromedially for 6 months. EXAM: MRI OF THE LEFT KNEE WITHOUT CONTRAST TECHNIQUE: Multiplanar, multisequence MR imaging of the knee was performed. No intravenous contrast was administered. COMPARISON:  12/31/2004 FINDINGS: Despite efforts by the technologist and patient, motion artifact is present on today's exam and could not be eliminated. This reduces exam sensitivity and specificity. MENISCI Medial meniscus: Degenerative tearing of the posterior horn and midbody medial meniscus with free edge truncation in the midbody, oblique tear in the posterior horn involving the superior surface, and also inferior surface irregularity in the posterior horn. Possible flap of meniscal tissue extending cephalad to the meniscal root.  Lateral meniscus:  Unremarkable LIGAMENTS Cruciates:  Unremarkable Collaterals: Expansion and edema in the proximal MCL. Proximal popliteus tendinopathy. CARTILAGE Patellofemoral: Mild degenerative chondral thinning with marginal spurring. There is also some articular surface spurring along the medial patellar facet. Medial: Severe degenerative chondral thinning with marginal spurring and small degenerative subcortical foci of edema. Lateral: Mild degenerative chondral thinning with marginal spurring. Joint:  Large knee effusion with synovitis. Popliteal Fossa:  Unremarkable Extensor Mechanism:  Unremarkable Bones: No significant extra-articular osseous abnormalities identified. Other: Prepatellar subcutaneous edema. IMPRESSION: 1. Mildly complex degenerative tearing of the posterior horn and midbody medial meniscus. Possible small flap of meniscal tissue extending cephalad to the posterior meniscal root. 2. Grade 2 sprain or partial tearing of the proximal MCL. 3. Proximal popliteus tendinopathy. 4.  Severe degenerative chondral thinning in the medial compartment. Mild chondral thinning in the patellofemoral joint and lateral compartment. 5. Large knee effusion with synovitis. 6. Prepatellar subcutaneous edema. Electronically Signed   By: Van Clines M.D.   On: 03/18/2016 13:24   Dg Knee Left Port  Result Date: 04/15/2016 CLINICAL DATA:  Status post partial knee replacement EXAM: PORTABLE LEFT KNEE - 1-2 VIEW COMPARISON:  MRI 03/18/2016 FINDINGS: Patient has undergone medial unicondylar knee arthroplasty. Clips overlie the anterior aspect of the knee. Small amount of gas identified within the joint space. There is no acute fracture or subluxation. IMPRESSION: No adverse features identified following the intercondylar knee arthroplasty. Electronically Signed   By: Nolon Nations M.D.   On: 04/15/2016 10:31  Disposition: Plan will be for discharge home today following PT sessions today.  Pt will need to urinate without foley prior to being discharge. Will discharge with Lovenox, oxycodone and Zofran for nausea.  Follow-up Information    Judson Roch, PA-C Follow up in 14 day(s).   Specialty:  Physician Assistant Why:  For staple removal. Contact information: [place] [address] [phone]          Signed: Judson Roch PA-C 04/16/2016, 8:03 AM

## 2016-04-15 NOTE — Transfer of Care (Signed)
Immediate Anesthesia Transfer of Care Note  Patient: Elizabeth Henderson  Procedure(s) Performed: Procedure(s): UNICOMPARTMENTAL KNEE (Left)  Patient Location: PACU  Anesthesia Type:Spinal  Level of Consciousness: awake, alert , oriented and patient cooperative  Airway & Oxygen Therapy: Patient Spontanous Breathing and Patient connected to nasal cannula oxygen  Post-op Assessment: Report given to RN and Post -op Vital signs reviewed and stable  Post vital signs: Reviewed and stable  Last Vitals:  Vitals:   04/15/16 0632 04/15/16 0940  BP: (!) 149/63 (!) 128/58  Pulse: 67 81  Resp: 16 17  Temp: 36.5 C (!) 36.1 C    Last Pain:  Vitals:   04/15/16 0632  TempSrc: Oral      Patients Stated Pain Goal: 0 ([id number])  Complications: No apparent anesthesia complications

## 2016-04-15 NOTE — Op Note (Signed)
04/15/2016  9:39 AM  Patient:   Elizabeth Henderson  Pre-Op Diagnosis:   Osteoarthritis of medial compartment, left knee.  Post-Op Diagnosis:   Same  Procedure:   Left unicondylar knee arthroplasty.  Surgeon:   Pascal Lux, MD  Assistant:   Cameron Proud, PA-C  Anesthesia:   Spinal  Findings:   As above.  Complications:   None  EBL:   5 cc  Fluids:   1000 cc crystalloid  UOP:   100 cc  TT:   75 minutes at 300 mmHg  Drains:   None  Closure:   Staples  Implants:   All-cemented Biomet Oxford system with an extra small femoral component, an "A" sized tibial tray, and a 3 mm meniscal bearing insert.  Brief Clinical Note:   The patient is an 82 year old female with a history of progressively worsening medial sided left knee pain. Her symptoms have persisted despite medications, activity modification, injections, etc. Her history and examination are consistent with degenerative joint disease limited to the medial compartment, as confirmed by MRI scan. The patient presents at this time for a left partial knee replacement.  Procedure:   The patient was brought into the operating room and a spinal placed by the anesthesiologist. The patient was lain in the supine position and a Foley catheter inserted. The patient was repositioned so that the non-surgical leg was placed in a flexed and abducted position in the yellow fin leg holder while the surgical extremity was placed over the Biomet leg holder. The left lower extremity was prepped with ChloraPrep solution before being draped sterilely. Preoperative antibiotics were administered. After performing a timeout to verify the appropriate surgical site, the limb was exsanguinated with an Esmarch and the tourniquet inflated to 300 mmHg. A standard anterior approach to the knee was made through an approximately 3.5-4 inch incision. The incision was carried down through the subcutaneous tissues to expose the superficial retinaculum. This was  split the length the incision and medial flap elevated sufficiently to expose the medial retinaculum. This was incised along the medial border of the patella tendon and extended proximally along the medial border of the patella, leaving a 3-4 mm cuff of tissue. The soft tissues were elevated off the anteromedial aspect of the proximal tibia. The anterior portion of the meniscus was removed after performing a subtotal excision of the infrapatellar fat pad. The anterior cruciate ligament was inspected and found to be in excellent condition. Osteophytes were removed from the inferior pole of the patella as well as from the notch using a quarter-inch osteotome. There were significant degenerative changes of both the femur and tibia on the medial side. The medial femoral condyle was sized using the small and medium sizers. It was felt that the extra small guide best optimized the contour of the femur. This was left in place and the external tibial guide positioned. The coupling device was used to connect the guide to the medial femoral condylar sizer to optimize appropriate orientation. Two guide pins were inserted into the cutting block before the coupling device and sizer were removed. The appropriate tibial cut was made using the oscillating and reciprocating saws. The piece was removed in its entirety and taken to the back table where it was sized and found to be optimally replicated by an "A" sized component. The 8 mm spacer was inserted to verify that sufficient bone had been removed.  Attention was directed to femoral side. The intramedullary canal was accessed through a 4  mm drill hole. The intramedullary guide was positioned before the guide for the femoral condylar holes was positioned. The appropriate coupling device connected this guide to the intramedullary guide before both drill holes were created in the distal aspect of the medial femoral condyle. The devices were removed and the posterior condylar  cutting block inserted. The appropriate cut was made using the reciprocating saw and this piece removed. The #0 spigot was inserted and the initial bone milling performed. A trial femoral component was inserted and both the flexion and extension gaps measured. In flexion, the gap measured 6 mm whereas in extension, it measured 3 mm. Therefore, the #3 spigot was selected and the secondary bone milling performed. Repeat sizing demonstrated symmetric flexion and extension gaps. The bone was removed from the postero-medial and postero-lateral aspects of the femoral condyle, as well as from the beneath the collar of the spigot. Bone also was removed from the anterior portion of the femur so as to minimize any potential impingement with the meniscal bearing insert. The trial components removed and several drill holes placed into the distal femoral condyle to further augment cement fixation.  Attention was redirected to the tibial side. The "A" sized tibial tray was positioned and temporarily secured using the appropriate spiked nail. The keel was created using the bi-bladed reciprocating saw and hoe. The keeled "A" sized trial tibial tray was inserted to be sure that it seated properly. At this point, a total of 20 cc of Exparel diluted out to 60 cc with normal saline and 30 cc of 0.5% Sensorcaine was injected in and around the posterior and medial capsular tissues, as well as the peri-incisional tissues to help with postoperative pain control.  The bony surfaces were prepared for cementing by irrigating them thoroughly with bacitracin saline solution using the jet lavage system before packing them with a dry Ray-Tec sponge. Meanwhile, cement was being mixed on the back table. When the cement was ready, the tibial tray was cemented in first. The excess cement was removed using a Surveyor, quantity after impacting it into place. Next, the femoral component was impacted into place. Again the excess cement was removed  using a Surveyor, quantity. The 4 mm spacer was inserted and the knee brought into near full extension while the cement hardened. Once the cement hardened, the spacer was removed and the 100mm meniscal bearing insert was trialed. This demonstrated excellent tracking while the knee was placed through a range of motion, and showed no evidence towards subluxation or dislocation. In addition, it did not fit too tightly. Therefore, the permanent 3 mm meniscal bearing insert was snapped into position after verifying that no cement had been retained posteriorly. Again the knee was placed through a range of motion with the findings as described above.  The wound was copiously irrigated with bacitracin saline solution via the jet lavage system before the retinacular layer was reapproximated using #0 Vicryl interrupted sutures. At this point, 1 g of transexemic acid in 10 cc of normal saline was injected intra-articularly. The subcutaneous tissues were closed in two layers using 2-0 Vicryl interrupted sutures before the skin was closed using staples. A sterile occlusive dressing was applied to the knee before the patient was awakened. The patient was transferred back to his/her hospital bed and returned to the recovery room in satisfactory condition after tolerating the procedure well. A Polar Care device was applied to the knee as well.

## 2016-04-15 NOTE — NC FL2 (Signed)
[place] LEVEL OF CARE SCREENING TOOL     IDENTIFICATION  Patient Name: Elizabeth Henderson Birthdate: 10-Apr-1932 Sex: female Admission Date (Current Location): 04/15/2016  [place] and [place] Number:  Engineering geologist and Address:  [facility], [address] [place], Elizabeth Henderson [address]      Provider Number: [id number]  Attending Physician Name and Address:  Corky Mull, MD  Relative Name and Phone Number:       Current Level of Care: Hospital Recommended Level of Care: [place] Prior Approval Number:    Date Approved/Denied:   PASRR Number:  ([id number])  Discharge Plan: SNF    Current Diagnoses: Patient Active Problem List   Diagnosis Date Noted  . Status post left partial knee replacement 04/15/2016  . Left knee pain 12/29/2015  . Thyroid nodule 08/31/2015  . Dysphagia 07/20/2015  . Hypothyroidism 12/28/2014  . Health care maintenance 12/28/2014  . Right carotid bruit 06/20/2014  . Hyperglycemia 12/21/2013  . Back pain 12/21/2013  . Leg swelling 02/18/2013  . Hypercholesterolemia 08/11/2012  . Hypertension 08/11/2012  . IBS (irritable bowel syndrome) 08/11/2012  . GERD (gastroesophageal reflux disease) 08/11/2012    Orientation RESPIRATION BLADDER Height & Weight     Self, Time, Situation, Place  O2 (2 Liters Oxygen ) Continent Weight: 158 lb (71.7 kg) Height:  5\' 2"  (157.5 cm)  BEHAVIORAL SYMPTOMS/MOOD NEUROLOGICAL BOWEL NUTRITION STATUS   (none )  (none ) Continent Diet (Diet: Clear Liquid )  AMBULATORY STATUS COMMUNICATION OF NEEDS Skin   Extensive Assist Verbally Surgical wounds (Incision: Left Knee )                       Personal Care Assistance Level of Assistance  Bathing, Feeding, Dressing Bathing Assistance: Limited assistance Feeding assistance: Independent Dressing Assistance: Limited assistance     Functional Limitations Info  Sight, Hearing, Speech Sight Info:  Adequate Hearing Info: Adequate Speech Info: Adequate    SPECIAL CARE FACTORS FREQUENCY  PT (By licensed PT), OT (By licensed OT)     PT Frequency:  (5) OT Frequency:  (5)            Contractures      Additional Factors Info  Code Status, Allergies Code Status Info:  (Full Code. ) Allergies Info:  (Prinivil Lisinopril, Ace Inhibitors, Floxin Ofloxacin, Sulfa Antibiotics, Vioxx Rofecoxib)           Current Medications (04/15/2016):  This is the current hospital active medication list Current Facility-Administered Medications  Medication Dose Route Frequency Provider Last Rate Last Dose  . acetaminophen (TYLENOL) tablet 650 mg  650 mg Oral Q6H PRN Corky Mull, MD       Or  . acetaminophen (TYLENOL) suppository 650 mg  650 mg Rectal Q6H PRN Corky Mull, MD      . acetaminophen (TYLENOL) tablet 1,000 mg  1,000 mg Oral Q6H Corky Mull, MD   1,000 mg at 04/15/16 1320  . amitriptyline (ELAVIL) tablet 25 mg  25 mg Oral QHS Corky Mull, MD      . amLODipine (NORVASC) tablet 10 mg  10 mg Oral Daily Corky Mull, MD   10 mg at 04/15/16 1320  . aspirin chewable tablet 81 mg  81 mg Oral Daily Corky Mull, MD      . atorvastatin (LIPITOR) tablet 40 mg  40 mg Oral q1800 Corky Mull, MD      .  bisacodyl (DULCOLAX) suppository 10 mg  10 mg Rectal Daily PRN Corky Mull, MD      . Derrill Memo ON 04/16/2016] calcium carbonate (TUMS - dosed in mg elemental calcium) chewable tablet 600 mg of elemental calcium  600 mg of elemental calcium Oral Daily Corky Mull, MD      . ceFAZolin (ANCEF) 2-4 GM/100ML-% IVPB           . ceFAZolin (ANCEF) IVPB 2g/100 mL premix  2 g Intravenous Q6H Corky Mull, MD      . cholecalciferol (VITAMIN D) tablet 1,000 Units  1,000 Units Oral Daily Marshall Cork Poggi, MD      . dextrose 5 % and 0.9 % NaCl with KCl 20 mEq/L infusion   Intravenous Continuous Corky Mull, MD 75 mL/hr at 04/15/16 1205    . diphenhydrAMINE (BENADRYL) 12.5 MG/5ML elixir 12.5-25 mg  12.5-25 mg  Oral Q4H PRN Corky Mull, MD      . docusate sodium (COLACE) capsule 100 mg  100 mg Oral BID Corky Mull, MD   100 mg at 04/15/16 1321  . [START ON 04/16/2016] enoxaparin (LOVENOX) injection 40 mg  40 mg Subcutaneous Q24H Corky Mull, MD      . estrogens (conjugated) (PREMARIN) tablet 0.9 mg  0.9 mg Oral Daily Corky Mull, MD   0.9 mg at 04/15/16 1320  . HYDROmorphone (DILAUDID) injection 0.5-1 mg  0.5-1 mg Intravenous Q2H PRN Corky Mull, MD      . ketorolac (TORADOL) 15 MG/ML injection 7.5 mg  7.5 mg Intravenous Q6H Corky Mull, MD   7.5 mg at 04/15/16 1321  . ketorolac (TORADOL) 30 MG/ML injection           . [START ON 04/16/2016] levothyroxine (SYNTHROID, LEVOTHROID) tablet 50 mcg  50 mcg Oral QAC breakfast Corky Mull, MD      . magnesium hydroxide (MILK OF MAGNESIA) suspension 30 mL  30 mL Oral Daily PRN Corky Mull, MD      . metoCLOPramide (REGLAN) tablet 5-10 mg  5-10 mg Oral Q8H PRN Corky Mull, MD       Or  . metoCLOPramide (REGLAN) injection 5-10 mg  5-10 mg Intravenous Q8H PRN Corky Mull, MD      . Derrill Memo ON 04/16/2016] metoprolol succinate (TOPROL-XL) 24 hr tablet 50 mg  50 mg Oral Daily Corky Mull, MD      . ondansetron [facility]) tablet 4 mg  4 mg Oral Q6H PRN Corky Mull, MD       Or  . ondansetron [facility]) injection 4 mg  4 mg Intravenous Q6H PRN Corky Mull, MD      . oxyCODONE (Oxy IR/ROXICODONE) immediate release tablet 5-10 mg  5-10 mg Oral Q3H PRN Corky Mull, MD   5 mg at 04/15/16 1153  . [START ON 04/16/2016] pantoprazole (PROTONIX) EC tablet 40 mg  40 mg Oral Daily Corky Mull, MD      . sodium phosphate (FLEET) 7-19 GM/118ML enema 1 enema  1 enema Rectal Once PRN Corky Mull, MD         Discharge Medications: Please see discharge summary for a list of discharge medications.  Relevant Imaging Results:  Relevant Lab Results:   Additional Information  (SSN: [id number])  Elizabeth Henderson, Elizabeth Beets, LCSW

## 2016-04-15 NOTE — Anesthesia Postprocedure Evaluation (Signed)
Anesthesia Post Note  Patient: Elizabeth Henderson  Procedure(s) Performed: Procedure(s) (LRB): UNICOMPARTMENTAL KNEE (Left)  Patient location during evaluation: PACU Anesthesia Type: MAC Level of consciousness: awake Pain management: pain level controlled Vital Signs Assessment: post-procedure vital signs reviewed and stable Respiratory status: spontaneous breathing Cardiovascular status: stable Anesthetic complications: no    Last Vitals:  Vitals:   04/15/16 0955 04/15/16 1010  BP: (!) 141/61 134/61  Pulse: 79 82  Resp: 10 17  Temp: 36.4 C     Last Pain:  Vitals:   04/15/16 1010  TempSrc:   PainSc: 0-No pain                 VAN STAVEREN,Lanora Reveron

## 2016-04-16 ENCOUNTER — Encounter: Payer: Self-pay | Admitting: Surgery

## 2016-04-16 LAB — CBC WITH DIFFERENTIAL/PLATELET
Basophils Absolute: 0.1 10*3/uL (ref 0–0.1)
Basophils Relative: 1 %
Eosinophils Absolute: 0.2 10*3/uL (ref 0–0.7)
Eosinophils Relative: 1 %
HCT: 36.7 % (ref 35.0–47.0)
Hemoglobin: 12.7 g/dL (ref 12.0–16.0)
Lymphocytes Relative: 11 %
Lymphs Abs: 1.2 10*3/uL (ref 1.0–3.6)
MCH: 31.5 pg (ref 26.0–34.0)
MCHC: 34.5 g/dL (ref 32.0–36.0)
MCV: 91.2 fL (ref 80.0–100.0)
Monocytes Absolute: 0.8 10*3/uL (ref 0.2–0.9)
Monocytes Relative: 7 %
Neutro Abs: 9.1 10*3/uL — ABNORMAL HIGH (ref 1.4–6.5)
Neutrophils Relative %: 80 %
Platelets: 173 10*3/uL (ref 150–440)
RBC: 4.03 MIL/uL (ref 3.80–5.20)
RDW: 12.6 % (ref 11.5–14.5)
WBC: 11.3 10*3/uL — ABNORMAL HIGH (ref 3.6–11.0)

## 2016-04-16 LAB — BASIC METABOLIC PANEL
Anion gap: 8 (ref 5–15)
BUN: 9 mg/dL (ref 6–20)
CO2: 26 mmol/L (ref 22–32)
Calcium: 8.5 mg/dL — ABNORMAL LOW (ref 8.9–10.3)
Chloride: 102 mmol/L (ref 101–111)
Creatinine, Ser: 0.94 mg/dL (ref 0.44–1.00)
GFR calc Af Amer: >60 mL/min (ref >60)
GFR calc non Af Amer: 55 mL/min — ABNORMAL LOW (ref >60)
Glucose, Bld: 132 mg/dL — ABNORMAL HIGH (ref 65–99)
Potassium: 3.7 mmol/L (ref 3.5–5.1)
Sodium: 136 mmol/L (ref 135–145)

## 2016-04-16 MED ORDER — OXYCODONE HCL 5 MG PO TABS: 5.0000 mg | ORAL_TABLET | ORAL | 0 refills | Status: DC | PRN

## 2016-04-16 MED ORDER — ENOXAPARIN SODIUM 40 MG/0.4ML ~~LOC~~ SOLN: 40.0000 mg | SUBCUTANEOUS | 0 refills | Status: DC

## 2016-04-16 MED ORDER — ONDANSETRON HCL 4 MG PO TABS: 4.0000 mg | ORAL_TABLET | Freq: Three times a day (TID) | ORAL | 0 refills | Status: DC | PRN

## 2016-04-16 NOTE — Care Management Important Message (Signed)
Important Message  Patient Details  Name: JERENE CICHY MRN: [id number] Date of Birth: 1932-01-29   Medicare Important Message Given:  N/A - LOS <3 / Initial given by admissions    Alvie Heidelberg, RN 04/16/2016, 8:28 AM

## 2016-04-16 NOTE — Progress Notes (Signed)
Physical Therapy Treatment Patient Details Name: Elizabeth Henderson MRN: [id number] DOB: 1932/02/14 Today's Date: 04/16/2016    History of Present Illness Pt is am 82 y/o female s/p L partial knee replacement. WBAT    PT Comments    Pt is POD#1 s/p L partial knee replacement. Pt reports less nausea upon PT arrival. Pt requires min assist for bed mobility and transfers and min guard for ambulation. Pt performed safe sit/stand transfer and ambulation with RW with minimal verbal cueing. Pt demonstrates a continuous, reciprocal gait pattern with decreased stance time on the L. Pt able to ambulate around nurses station with no rest breaks needed and no reports of nausea. Pt has met her physical therapy goals including stair training. L knee AROM: 1-81 degrees. Pt will continue to benefit from skilled PT in order to improve strength, ROM, and functional mobility. At this time, pt is appropriate for HHPT to achieve her goals.   Follow Up Recommendations  Home health PT     Equipment Recommendations  Rolling walker with 5" wheels    Recommendations for Other Services       Precautions / Restrictions Precautions Precautions: Fall;Knee Precaution Booklet Issued: Yes (comment) Precaution Comments: TKR exercise packet Restrictions Weight Bearing Restrictions: Yes LLE Weight Bearing: Weight bearing as tolerated    Mobility  Bed Mobility Overal bed mobility: Needs Assistance Bed Mobility: Sit to Supine     Sit to supine: Min assist   General bed mobility comments: PT assistance for L LE support from sit to supine. PT uses B UE and R LE to push through bed and scoot up to [facility] with mod verbal cueing from PT.  Transfers Overall transfer level: Needs assistance Equipment used: Rolling walker (2 wheeled) Transfers: Sit to/from Stand Sit to Stand: Min assist         General transfer comment: Pt demonstrates proper hand and foot placement with sit/stand transfers with minimal cueing.    Ambulation/Gait Ambulation/Gait assistance: Min guard Ambulation Distance (Feet): 280 Feet Assistive device: Rolling walker (2 wheeled) Gait Pattern/deviations: Step-through pattern;Decreased stance time - left;Decreased stride length;Antalgic Gait velocity: 12 ft/sec Gait velocity interpretation: Below normal speed for age/gender General Gait Details: Pt requires minimal verbal cueing for reciprocal gait pattern. Pt ambulated for 280 feet with no rest breaks needed and no nausea noted. Pt's husband brought pt's RW in for this session.   Stairs    Wheelchair Mobility    Modified Rankin (Stroke Patients Only)       Balance Overall balance assessment: Modified Independent Sitting-balance support: No upper extremity supported;Feet supported Sitting balance-Leahy Scale: Normal     Standing balance support: Bilateral upper extremity supported Standing balance-Leahy Scale: Good Standing balance comment: Pt demonstrates safe standing balance with B UE support on RW.                    Cognition Arousal/Alertness: Awake/alert Behavior During Therapy: WFL for tasks assessed/performed Overall Cognitive Status: Within Functional Limits for tasks assessed                      Exercises Total Joint Exercises Goniometric ROM: L knee flexion: 81 degrees Other Exercises Other Exercises: Seated L knee flexion stretches x 5 with towel under L LE.    General Comments        Pertinent Vitals/Pain Pain Assessment: 0-10 Pain Score: 4  Pain Location: L knee Pain Descriptors / Indicators: Aching Pain Intervention(s): Monitored during session;Ice applied  Home Living                      Prior Function            PT Goals (current goals can now be found in the care plan section) Acute Rehab PT Goals Patient Stated Goal: To return home PT Goal Formulation: With patient Time For Goal Achievement: 04/29/16 Potential to Achieve Goals: Good Progress  towards PT goals: Progressing toward goals    Frequency  BID    PT Plan Current plan remains appropriate    Co-evaluation             End of Session Equipment Utilized During Treatment: Gait belt Activity Tolerance: Patient tolerated treatment well Patient left: in bed;with call bell/phone within reach;with bed alarm set;with family/visitor present     Time: 5809-9833 PT Time Calculation (min) (ACUTE ONLY): 29 min  Charges:                       G Codes:      Georgina Pillion May 01, 2016, 3:11 PM  Georgina Pillion, SPT [phone]

## 2016-04-16 NOTE — Care Management Note (Signed)
Case Management Note  Patient Details  Name: Elizabeth Henderson MRN: [id number] Date of Birth: 28-Sep-1931  Subjective/Objective:  Spoke with patient at the bedside who is alert and oriented from home with her husband and adult daughter, Patient stated that she was driving up until having surgery and is independent.  She stated that [facility]. Lovenox 40mg  injection daily for 14 days called in. Paitents stated that she has a front rolling walker and and that her bathroom is in her bedroom. She stated that she doesn't want a BSC.  Choice of [place] providers offered and list given for patient review. Patient chose to go with [facility]. Referral placed with Time Koleen Nimrod at [facility]. Patient will discharge today. She has all of my contact information.                Action/Plan: Home with Home Health services.   Expected Discharge Date:                  Expected Discharge Plan:  [place]  In-House Referral:     Discharge planning Services  CM Consult  Post Acute Care Choice:    Choice offered to:  Patient  DME Arranged:  N/A DME Agency:  NA  HH Arranged:  PT [place]:  [facility] (now [facility])  Status of Service:  Completed, signed off  If discussed at [place] of Stay Meetings, dates discussed:    Additional Comments:  Alvie Heidelberg, RN 04/16/2016, 9:08 AM

## 2016-04-16 NOTE — Progress Notes (Signed)
  Subjective: 1 Day Post-Op Procedure(s) (LRB): UNICOMPARTMENTAL KNEE (Left) Patient reports pain as mild and severe during the night last night.   Patient is well, and has had no acute complaints or problems Plan is to go Home with HHPT after hospital stay. Negative for chest pain and shortness of breath Fever: no Gastrointestinal:Positive for nausea yesterday.  Objective: Vital signs in last 24 hours: Temp:  [96.9 F (36.1 C)-98.1 F (36.7 C)] 98.1 F (36.7 C) (07/28 0624) Pulse Rate:  [71-87] 87 (07/28 0624) Resp:  [10-18] 18 (07/28 0624) BP: (128-153)/(39-66) 140/54 (07/28 0624) SpO2:  [91 %-100 %] 91 % (07/28 0624)  Intake/Output from previous day:  Intake/Output Summary (Last 24 hours) at 04/16/16 0727 Last data filed at 04/16/16 0400  Gross per 24 hour  Intake          3003.75 ml  Output             3505 ml  Net          -501.25 ml    Intake/Output this shift: No intake/output data recorded.  Labs:  Recent Labs  04/16/16 0534  HGB 12.7    Recent Labs  04/16/16 0534  WBC 11.3*  RBC 4.03  HCT 36.7  PLT 173    Recent Labs  04/16/16 0534  NA 136  K 3.7  CL 102  CO2 26  BUN 9  CREATININE 0.94  GLUCOSE 132*  CALCIUM 8.5*   No results for input(s): LABPT, INR in the last 72 hours.   EXAM General - Patient is Alert, Appropriate and Oriented Extremity - Neurologically intact ABD soft Sensation intact distally Intact pulses distally Incision: dressing C/D/I No cellulitis present Dressing/Incision - clean, dry, no drainage Motor Function - intact, moving foot and toes well on exam.   Negative Homans bilaterally.  Past Medical History:  Diagnosis Date  . Cancer of left breast, stage 0 .0  . Complication of anesthesia   . Diverticulosis   . Dysphagia   . Family history of adverse reaction to anesthesia    brother had a hard time waking up with hernia surgery as a child.  Marland Kitchen GERD (gastroesophageal reflux disease)   . Headache(784.0)    h/o recurrent vascular headaches. no longer gets these  . Hypercholesterolemia   . Hypertension   . Hypothyroidism   . IBS (irritable bowel syndrome)   . Leg swelling   . Neck fullness 2016   dr. Nicki Reaper reviewed and found it to be nothing.  prinivil seemed to cause face and neck and tongue swelling  . PONV (postoperative nausea and vomiting)   . Urinary tract bacterial infections    Assessment/Plan: 1 Day Post-Op Procedure(s) (LRB): UNICOMPARTMENTAL KNEE (Left) Active Problems:   Status post left partial knee replacement  Estimated body mass index is 28.9 kg/m as calculated from the following:   Height as of this encounter: 5\' 2"  (1.575 m).   Weight as of this encounter: 71.7 kg (158 lb). Advance diet Up with therapy D/C IV fluids when tolerating PO diet.  Labs reviewed this AM, Hg stable at 12.7 Pt reports increased pain last night, instructed her that medication injected to numb knee likely wearing off. Foley removed this AM. Plan will be for discharge home today pending PT and urinating without Foley in place.  DVT Prophylaxis - Lovenox, Foot Pumps and TED hose Weight-Bearing as tolerated to left leg  J. Cameron Proud, PA-C [facility] 04/16/2016, 7:27 AM

## 2016-04-16 NOTE — Progress Notes (Signed)
Clinical Social Worker (CSW) received SNF consult. PT is recommending home health. RN Case Manager is aware of above. Please reconsult if future social work needs arise. CSW signing off.   Dorien Mayotte, LCSW [phone] 

## 2016-04-16 NOTE — Progress Notes (Signed)
Pt did well with physical therapy. Nausea improved by backing off of narcotics. Lovenox teaching done. Dressing changed. DC home.

## 2016-04-16 NOTE — Progress Notes (Signed)
Physical Therapy Treatment Patient Details Name: Elizabeth Henderson MRN: [id number] DOB: 1932/09/09 Today's Date: 04/16/2016    History of Present Illness Pt is am 82 y/o female s/p L partial knee replacement. WBAT    PT Comments    Pt is POD#1 s/p L partial knee replacement. Pt requires min assist for bed mobility and transfers,  min guard for ambulation with RW, and min assist for stairs. Reinforced WBAT status. Requires verbal and tactile cueing to reach for arm rest for stand/sit transfers. Requires verbal and visual cueing to initiate reciprocal gait pattern with RW. Pt able to walk 150 feet but required 2 standing rest breaks and 2 sitting rest breaks. Pt mostly limited due to nausea, RN notified and administered IV nausea meds during session. Pt able to ascend/descend 4 steps with R rail with verbal cueing for safety and efficiency. CSW and case manager informed on pt progress. Pt will benefit from skilled PT in order to improve strength, ROM, and functional mobility. Pt is continuing to progress towards her goals and at this time, pt is appropriate for HHPT.  Follow Up Recommendations  Home health PT     Equipment Recommendations  Rolling walker with 5" wheels    Recommendations for Other Services       Precautions / Restrictions Precautions Precautions: Fall;Knee Precaution Booklet Issued: Yes (comment) Precaution Comments: TKR exercise packet Restrictions Weight Bearing Restrictions: Yes LLE Weight Bearing: Weight bearing as tolerated    Mobility  Bed Mobility Overal bed mobility: Needs Assistance Bed Mobility: Supine to Sit     Supine to sit: Min assist     General bed mobility comments: Pt uses B UE support to push through bed and pull on bed rail to sit on EOB. Pt able to scoot to EOB independently. PT assistance to support L LE with bed mobility.   Transfers Overall transfer level: Needs assistance Equipment used: Rolling walker (2 wheeled) Transfers: Sit  to/from Stand Sit to Stand: Min assist         General transfer comment: Verbal cueing for hand and foot placement for safe sit/stand technique with RW. Verbal and tactile cueing to reach back for arm rests, lean forward, and prop foot out before sitting.  Ambulation/Gait Ambulation/Gait assistance: Min guard Ambulation Distance (Feet): 150 Feet Assistive device: Rolling walker (2 wheeled) Gait Pattern/deviations: Step-through pattern;Decreased stride length;Antalgic   Gait velocity interpretation: Below normal speed for age/gender General Gait Details: Verbal and visual cueing for reciprocal gait pattern. Pt relies heavily on B UE support on RW to off-weight L LE. Pt limited by nausea.   Stairs Stairs: Yes Stairs assistance: Min assist Stair Management: One rail Right;Step to pattern Number of Stairs: 4 General stair comments: Pt uses B UE to push/pull on R railing to assist with ascending/descending stairs. Minimal verbal cueing for hand and feet placement. Reinforced "up with the good and down with the bad".  Wheelchair Mobility    Modified Rankin (Stroke Patients Only)       Balance Overall balance assessment: Modified Independent Sitting-balance support: Bilateral upper extremity supported;Feet supported Sitting balance-Leahy Scale: Normal     Standing balance support: Bilateral upper extremity supported Standing balance-Leahy Scale: Good Standing balance comment: Pt relies on B UE support on RW to maintain standing balance.                    Cognition Arousal/Alertness: Awake/alert Behavior During Therapy: WFL for tasks assessed/performed Overall Cognitive Status: Within Functional Limits for tasks  assessed                      Exercises Total Joint Exercises Goniometric ROM: L knee extension: 1 degree Other Exercises Other Exercises: Supine ther ex: B ankle pumps, L quad sets, L hip abd/add, L SAQs, L SLRs x 10 reps with verbal and visual  cueing for proper technique with supervision.    General Comments        Pertinent Vitals/Pain Pain Assessment: 0-10 Pain Score: 4  Pain Location: L knee Pain Descriptors / Indicators: Aching Pain Intervention(s): Limited activity within patient's tolerance;Monitored during session;Ice applied    Home Living                      Prior Function            PT Goals (current goals can now be found in the care plan section) Acute Rehab PT Goals Patient Stated Goal: To return home PT Goal Formulation: With patient Time For Goal Achievement: 04/29/16 Potential to Achieve Goals: Good Progress towards PT goals: Progressing toward goals    Frequency  BID    PT Plan Current plan remains appropriate    Co-evaluation             End of Session Equipment Utilized During Treatment: Gait belt Activity Tolerance: Other (comment) (Pt limited by nausea) Patient left: in chair;with call bell/phone within reach;with chair alarm set;with family/visitor present     Time: [id number] PT Time Calculation (min) (ACUTE ONLY): 62 min  Charges:  $Gait Training: 23-37 mins $Therapeutic Exercise: 23-37 mins                    G Codes:      Georgina Pillion May 16, 2016, 1:51 PM  Georgina Pillion, SPT [phone]

## 2016-04-16 NOTE — Discharge Instructions (Signed)

## 2016-04-18 DIAGNOSIS — Z96652 Presence of left artificial knee joint: Secondary | ICD-10-CM | POA: Diagnosis not present

## 2016-04-18 DIAGNOSIS — Z853 Personal history of malignant neoplasm of breast: Secondary | ICD-10-CM | POA: Diagnosis not present

## 2016-04-18 DIAGNOSIS — I10 Essential (primary) hypertension: Secondary | ICD-10-CM | POA: Diagnosis not present

## 2016-04-18 DIAGNOSIS — Z471 Aftercare following joint replacement surgery: Secondary | ICD-10-CM | POA: Diagnosis not present

## 2016-04-20 DIAGNOSIS — Z853 Personal history of malignant neoplasm of breast: Secondary | ICD-10-CM | POA: Diagnosis not present

## 2016-04-20 DIAGNOSIS — Z96652 Presence of left artificial knee joint: Secondary | ICD-10-CM | POA: Diagnosis not present

## 2016-04-20 DIAGNOSIS — I10 Essential (primary) hypertension: Secondary | ICD-10-CM | POA: Diagnosis not present

## 2016-04-20 DIAGNOSIS — Z471 Aftercare following joint replacement surgery: Secondary | ICD-10-CM | POA: Diagnosis not present

## 2016-04-21 DIAGNOSIS — I10 Essential (primary) hypertension: Secondary | ICD-10-CM | POA: Diagnosis not present

## 2016-04-21 DIAGNOSIS — Z96652 Presence of left artificial knee joint: Secondary | ICD-10-CM | POA: Diagnosis not present

## 2016-04-21 DIAGNOSIS — Z853 Personal history of malignant neoplasm of breast: Secondary | ICD-10-CM | POA: Diagnosis not present

## 2016-04-21 DIAGNOSIS — Z471 Aftercare following joint replacement surgery: Secondary | ICD-10-CM | POA: Diagnosis not present

## 2016-04-23 DIAGNOSIS — Z853 Personal history of malignant neoplasm of breast: Secondary | ICD-10-CM | POA: Diagnosis not present

## 2016-04-23 DIAGNOSIS — I10 Essential (primary) hypertension: Secondary | ICD-10-CM | POA: Diagnosis not present

## 2016-04-23 DIAGNOSIS — Z471 Aftercare following joint replacement surgery: Secondary | ICD-10-CM | POA: Diagnosis not present

## 2016-04-23 DIAGNOSIS — Z96652 Presence of left artificial knee joint: Secondary | ICD-10-CM | POA: Diagnosis not present

## 2016-04-26 ENCOUNTER — Other Ambulatory Visit: Payer: PRIVATE HEALTH INSURANCE

## 2016-04-26 DIAGNOSIS — Z96652 Presence of left artificial knee joint: Secondary | ICD-10-CM | POA: Diagnosis not present

## 2016-04-26 DIAGNOSIS — I10 Essential (primary) hypertension: Secondary | ICD-10-CM | POA: Diagnosis not present

## 2016-04-26 DIAGNOSIS — Z471 Aftercare following joint replacement surgery: Secondary | ICD-10-CM | POA: Diagnosis not present

## 2016-04-26 DIAGNOSIS — Z853 Personal history of malignant neoplasm of breast: Secondary | ICD-10-CM | POA: Diagnosis not present

## 2016-04-27 DIAGNOSIS — Z853 Personal history of malignant neoplasm of breast: Secondary | ICD-10-CM | POA: Diagnosis not present

## 2016-04-27 DIAGNOSIS — Z96652 Presence of left artificial knee joint: Secondary | ICD-10-CM | POA: Diagnosis not present

## 2016-04-27 DIAGNOSIS — I10 Essential (primary) hypertension: Secondary | ICD-10-CM | POA: Diagnosis not present

## 2016-04-27 DIAGNOSIS — Z471 Aftercare following joint replacement surgery: Secondary | ICD-10-CM | POA: Diagnosis not present

## 2016-04-28 DIAGNOSIS — Z96652 Presence of left artificial knee joint: Secondary | ICD-10-CM | POA: Diagnosis not present

## 2016-04-28 DIAGNOSIS — M25562 Pain in left knee: Secondary | ICD-10-CM | POA: Diagnosis not present

## 2016-04-28 DIAGNOSIS — M6281 Muscle weakness (generalized): Secondary | ICD-10-CM | POA: Diagnosis not present

## 2016-04-28 DIAGNOSIS — M25662 Stiffness of left knee, not elsewhere classified: Secondary | ICD-10-CM | POA: Diagnosis not present

## 2016-04-29 ENCOUNTER — Ambulatory Visit: Payer: PRIVATE HEALTH INSURANCE | Admitting: Internal Medicine

## 2016-04-30 DIAGNOSIS — M25562 Pain in left knee: Secondary | ICD-10-CM | POA: Diagnosis not present

## 2016-04-30 DIAGNOSIS — Z96652 Presence of left artificial knee joint: Secondary | ICD-10-CM | POA: Diagnosis not present

## 2016-04-30 DIAGNOSIS — M25662 Stiffness of left knee, not elsewhere classified: Secondary | ICD-10-CM | POA: Diagnosis not present

## 2016-04-30 DIAGNOSIS — M6281 Muscle weakness (generalized): Secondary | ICD-10-CM | POA: Diagnosis not present

## 2016-05-04 ENCOUNTER — Other Ambulatory Visit: Payer: Self-pay | Admitting: Internal Medicine

## 2016-05-05 DIAGNOSIS — Z96652 Presence of left artificial knee joint: Secondary | ICD-10-CM | POA: Diagnosis not present

## 2016-05-05 DIAGNOSIS — M25562 Pain in left knee: Secondary | ICD-10-CM | POA: Diagnosis not present

## 2016-05-05 DIAGNOSIS — M6281 Muscle weakness (generalized): Secondary | ICD-10-CM | POA: Diagnosis not present

## 2016-05-05 DIAGNOSIS — M25662 Stiffness of left knee, not elsewhere classified: Secondary | ICD-10-CM | POA: Diagnosis not present

## 2016-05-07 DIAGNOSIS — Z96652 Presence of left artificial knee joint: Secondary | ICD-10-CM | POA: Diagnosis not present

## 2016-05-12 DIAGNOSIS — M23204 Derangement of unspecified medial meniscus due to old tear or injury, left knee: Secondary | ICD-10-CM | POA: Diagnosis not present

## 2016-05-12 DIAGNOSIS — M6281 Muscle weakness (generalized): Secondary | ICD-10-CM | POA: Diagnosis not present

## 2016-05-12 DIAGNOSIS — Z96652 Presence of left artificial knee joint: Secondary | ICD-10-CM | POA: Diagnosis not present

## 2016-05-12 DIAGNOSIS — M25662 Stiffness of left knee, not elsewhere classified: Secondary | ICD-10-CM | POA: Diagnosis not present

## 2016-05-14 DIAGNOSIS — Z96652 Presence of left artificial knee joint: Secondary | ICD-10-CM | POA: Diagnosis not present

## 2016-05-14 DIAGNOSIS — M25662 Stiffness of left knee, not elsewhere classified: Secondary | ICD-10-CM | POA: Diagnosis not present

## 2016-05-14 DIAGNOSIS — M6281 Muscle weakness (generalized): Secondary | ICD-10-CM | POA: Diagnosis not present

## 2016-05-19 DIAGNOSIS — Z96652 Presence of left artificial knee joint: Secondary | ICD-10-CM | POA: Diagnosis not present

## 2016-05-21 DIAGNOSIS — M25662 Stiffness of left knee, not elsewhere classified: Secondary | ICD-10-CM | POA: Diagnosis not present

## 2016-05-21 DIAGNOSIS — M25562 Pain in left knee: Secondary | ICD-10-CM | POA: Diagnosis not present

## 2016-05-21 DIAGNOSIS — Z96652 Presence of left artificial knee joint: Secondary | ICD-10-CM | POA: Diagnosis not present

## 2016-05-21 DIAGNOSIS — M6281 Muscle weakness (generalized): Secondary | ICD-10-CM | POA: Diagnosis not present

## 2016-05-26 DIAGNOSIS — M6281 Muscle weakness (generalized): Secondary | ICD-10-CM | POA: Diagnosis not present

## 2016-05-26 DIAGNOSIS — M23204 Derangement of unspecified medial meniscus due to old tear or injury, left knee: Secondary | ICD-10-CM | POA: Diagnosis not present

## 2016-05-26 DIAGNOSIS — M25662 Stiffness of left knee, not elsewhere classified: Secondary | ICD-10-CM | POA: Diagnosis not present

## 2016-05-26 DIAGNOSIS — Z96652 Presence of left artificial knee joint: Secondary | ICD-10-CM | POA: Diagnosis not present

## 2016-05-28 DIAGNOSIS — M1712 Unilateral primary osteoarthritis, left knee: Secondary | ICD-10-CM | POA: Diagnosis not present

## 2016-05-28 DIAGNOSIS — M23204 Derangement of unspecified medial meniscus due to old tear or injury, left knee: Secondary | ICD-10-CM | POA: Diagnosis not present

## 2016-07-07 ENCOUNTER — Other Ambulatory Visit (INDEPENDENT_AMBULATORY_CARE_PROVIDER_SITE_OTHER): Payer: Medicare Other

## 2016-07-07 DIAGNOSIS — E7800 Pure hypercholesterolemia, unspecified: Secondary | ICD-10-CM

## 2016-07-07 DIAGNOSIS — R739 Hyperglycemia, unspecified: Secondary | ICD-10-CM

## 2016-07-07 DIAGNOSIS — I10 Essential (primary) hypertension: Secondary | ICD-10-CM

## 2016-07-07 LAB — HEPATIC FUNCTION PANEL
ALBUMIN: 3.9 g/dL (ref 3.5–5.2)
ALT: 9 U/L (ref 0–35)
AST: 14 U/L (ref 0–37)
Alkaline Phosphatase: 85 U/L (ref 39–117)
Bilirubin, Direct: 0.1 mg/dL (ref 0.0–0.3)
TOTAL PROTEIN: 6.9 g/dL (ref 6.0–8.3)
Total Bilirubin: 0.6 mg/dL (ref 0.2–1.2)

## 2016-07-07 LAB — BASIC METABOLIC PANEL
BUN: 14 mg/dL (ref 6–23)
CALCIUM: 9.5 mg/dL (ref 8.4–10.5)
CO2: 28 mEq/L (ref 19–32)
Chloride: 103 mEq/L (ref 96–112)
Creatinine, Ser: 1.12 mg/dL (ref 0.40–1.20)
GFR: 49.51 mL/min — AB (ref >60.00)
GLUCOSE: 114 mg/dL — AB (ref 70–99)
POTASSIUM: 4.8 meq/L (ref 3.5–5.1)
SODIUM: 140 meq/L (ref 135–145)

## 2016-07-07 LAB — LIPID PANEL
CHOL/HDL RATIO: 2
CHOLESTEROL: 161 mg/dL (ref 0–200)
HDL: 67.2 mg/dL (ref >39.00)
LDL CALC: 58 mg/dL (ref 0–99)
NonHDL: 93.89
TRIGLYCERIDES: 177 mg/dL — AB (ref 0.0–149.0)
VLDL: 35.4 mg/dL (ref 0.0–40.0)

## 2016-07-07 LAB — HEMOGLOBIN A1C: HEMOGLOBIN A1C: 5.4 % (ref 4.6–6.5)

## 2016-07-09 ENCOUNTER — Ambulatory Visit (INDEPENDENT_AMBULATORY_CARE_PROVIDER_SITE_OTHER): Payer: Medicare Other | Admitting: Internal Medicine

## 2016-07-09 ENCOUNTER — Encounter: Payer: Self-pay | Admitting: Internal Medicine

## 2016-07-09 VITALS — BP 132/68 | HR 73 | Temp 98.2°F | Ht 62.0 in | Wt 150.0 lb

## 2016-07-09 DIAGNOSIS — K219 Gastro-esophageal reflux disease without esophagitis: Secondary | ICD-10-CM

## 2016-07-09 DIAGNOSIS — R630 Anorexia: Secondary | ICD-10-CM

## 2016-07-09 DIAGNOSIS — Z96652 Presence of left artificial knee joint: Secondary | ICD-10-CM | POA: Diagnosis not present

## 2016-07-09 DIAGNOSIS — E039 Hypothyroidism, unspecified: Secondary | ICD-10-CM | POA: Diagnosis not present

## 2016-07-09 DIAGNOSIS — Z23 Encounter for immunization: Secondary | ICD-10-CM | POA: Diagnosis not present

## 2016-07-09 DIAGNOSIS — E7800 Pure hypercholesterolemia, unspecified: Secondary | ICD-10-CM

## 2016-07-09 DIAGNOSIS — I10 Essential (primary) hypertension: Secondary | ICD-10-CM

## 2016-07-09 DIAGNOSIS — R739 Hyperglycemia, unspecified: Secondary | ICD-10-CM

## 2016-07-09 NOTE — Progress Notes (Signed)
Patient ID: MAECYN PANNING, female   DOB: 11/05/1931, 82 y.o.   MRN: [id number]   Subjective:    Patient ID: Lum Babe, female    DOB: July 20, 1932, 82 y.o.   MRN: [id number]  HPI  Patient here for a scheduled follow up.  Is /p left partial knee replacement - Dr Roland Rack (7//27/17).  Doing well.  Had physical therapy.  Knee has done well.  She does report some decreased appetite since the surgery.  Has lost some weight.  She feels was possibly related to the pain medication.  Is off now.  Will follow to see if improves.  No nausea or vomiting.  She is eating regular meals.  No abdominal pain or cramping.  Bowels stable.  She had questions about her medication.  On amitriptyline for previous vaginal burning.  Has worked well for her.  No problems taking.  Discussed estrogen usage.  She prefers to discuss with gyn.    Past Medical History:  Diagnosis Date  . Cancer of left breast, stage 0 .0  . Complication of anesthesia   . Diverticulosis   . Dysphagia   . Family history of adverse reaction to anesthesia    brother had a hard time waking up with hernia surgery as a child.  Marland Kitchen GERD (gastroesophageal reflux disease)   . Headache(784.0)    h/o recurrent vascular headaches. no longer gets these  . Hypercholesterolemia   . Hypertension   . Hypothyroidism   . IBS (irritable bowel syndrome)   . Leg swelling   . Neck fullness 2016   dr. Nicki Reaper reviewed and found it to be nothing.  prinivil seemed to cause face and neck and tongue swelling  . PONV (postoperative nausea and vomiting)   . Urinary tract bacterial infections    Past Surgical History:  Procedure Laterality Date  . ABDOMINAL HYSTERECTOMY    . APPENDECTOMY    . CHOLECYSTECTOMY  1990  . CYSTOSCOPY     x2  . ESOPHAGOGASTRODUODENOSCOPY (EGD) WITH PROPOFOL N/A 08/28/2015   Procedure: ESOPHAGOGASTRODUODENOSCOPY (EGD) WITH PROPOFOL;  Surgeon: Hulen Luster, MD;  Location[facility] ENDOSCOPY;  Service: Gastroenterology;  Laterality: N/A;  .  PARTIAL HYSTERECTOMY    . PARTIAL KNEE ARTHROPLASTY Left 04/15/2016   Procedure: UNICOMPARTMENTAL KNEE;  Surgeon: Corky Mull, MD;  Location: [facility] ORS;  Service: Orthopedics;  Laterality: Left;  . TONSILLECTOMY     Family History  Problem Relation Age of Onset  . Heart disease Father   . Heart disease Mother   . Breast cancer Neg Hx   . Colon cancer Neg Hx    Social History   Social History  . Marital status: Married    Spouse name: N/A  . Number of children: 2  . Years of education: N/A   Occupational History  . retired    Social History Main Topics  . Smoking status: Never Smoker  . Smokeless tobacco: Never Used  . Alcohol use No     Comment: seldom, a glass of wine 1-3 per year.  . Drug use: No  . Sexual activity: Not Currently   Other Topics Concern  . None   Social History Narrative  . None    Outpatient Encounter Prescriptions as of 07/09/2016  Medication Sig  . amitriptyline (ELAVIL) 25 MG tablet Take 25 mg by mouth at bedtime.  Marland Kitchen amLODipine (NORVASC) 10 MG tablet TAKE 1 TABLET DAILY  . amLODipine (NORVASC) 10 MG tablet TAKE 1 TABLET DAILY  . aspirin  81 MG tablet Take 81 mg by mouth daily.  Marland Kitchen atorvastatin (LIPITOR) 40 MG tablet TAKE 1 TABLET DAILY  . Calcium Carbonate (CALCIUM 600 PO) Take 1 tablet by mouth daily.  Marland Kitchen enoxaparin (LOVENOX) 40 MG/0.4ML injection Inject 0.4 mLs (40 mg total) into the skin daily.  Marland Kitchen estrogens, conjugated, (PREMARIN) 0.9 MG tablet Take 0.9 mg by mouth daily.   . metoprolol succinate (TOPROL-XL) 50 MG 24 hr tablet TAKE 1 TABLET TWICE A DAY  WITH OR IMMEDIATELY        FOLLOWING A MEAL  . pantoprazole (PROTONIX) 40 MG tablet Take 1 tablet (40 mg total) by mouth daily.  Marland Kitchen SYNTHROID 50 MCG tablet TAKE 1 TABLET DAILY BEFORE BREAKFAST  . Vitamin D, Cholecalciferol, 1000 units CAPS Take 1 capsule by mouth daily.  . [DISCONTINUED] ondansetron (ZOFRAN) 4 MG tablet Take 1 tablet (4 mg total) by mouth every 8 (eight) hours as needed for  nausea.  . [DISCONTINUED] oxyCODONE (OXY IR/ROXICODONE) 5 MG immediate release tablet Take 1-2 tablets (5-10 mg total) by mouth every 4 (four) hours as needed for breakthrough pain.   No facility-administered encounter medications on file as of 07/09/2016.     Review of Systems  Constitutional: Positive for appetite change. Negative for unexpected weight change.  HENT: Negative for congestion and sinus pressure.   Respiratory: Negative for cough, chest tightness and shortness of breath.   Cardiovascular: Negative for chest pain, palpitations and leg swelling.  Gastrointestinal: Negative for abdominal pain, diarrhea, nausea and vomiting.  Genitourinary: Negative for difficulty urinating and dysuria.  Musculoskeletal: Negative for back pain and joint swelling.  Skin: Negative for color change and rash.  Neurological: Negative for dizziness, light-headedness and headaches.  Psychiatric/Behavioral: Negative for agitation and dysphoric mood.       Objective:     Blood pressure rechecked by me:  132/68  Physical Exam  Constitutional: She appears well-developed and well-nourished. No distress.  HENT:  Nose: Nose normal.  Mouth/Throat: Oropharynx is clear and moist.  Neck: Neck supple. No thyromegaly present.  Cardiovascular: Normal rate and regular rhythm.   Pulmonary/Chest: Breath sounds normal. No respiratory distress. She has no wheezes.  Abdominal: Soft. Bowel sounds are normal. There is no tenderness.  Musculoskeletal: She exhibits no edema or tenderness.  Lymphadenopathy:    She has no cervical adenopathy.  Skin: No rash noted. No erythema.  Psychiatric: She has a normal mood and affect. Her behavior is normal.    BP 132/68   Pulse 73   Temp 98.2 F (36.8 C) (Oral)   Ht '5\' 2"'$  (1.575 m)   Wt 150 lb (68 kg)   SpO2 96%   BMI 27.44 kg/m  Wt Readings from Last 3 Encounters:  07/09/16 150 lb (68 kg)  04/15/16 158 lb (71.7 kg)  04/07/16 158 lb (71.7 kg)     Lab Results   Component Value Date   WBC 11.3 (H) 04/16/2016   HGB 12.7 04/16/2016   HCT 36.7 04/16/2016   PLT 173 04/16/2016   GLUCOSE 114 (H) 07/07/2016   CHOL 161 07/07/2016   TRIG 177.0 (H) 07/07/2016   HDL 67.20 07/07/2016   LDLDIRECT 64.9 12/08/2012   LDLCALC 58 07/07/2016   ALT 9 07/07/2016   AST 14 07/07/2016   NA 140 07/07/2016   K 4.8 07/07/2016   CL 103 07/07/2016   CREATININE 1.12 07/07/2016   BUN 14 07/07/2016   CO2 28 07/07/2016   TSH 2.78 08/21/2015   INR 1.03 04/07/2016  HGBA1C 5.4 07/07/2016    Dg Chest 2 View  Result Date: 04/07/2016 CLINICAL DATA:  Knee surgery.  Preoperative exam. EXAM: CHEST  2 VIEW COMPARISON:  No prior . FINDINGS: Mediastinum and hilar structures are normal. Prominence of sliding hiatal hernia. Prominent cardiac fat pads. Heart size normal. No focal infiltrate. No pleural effusion or pneumothorax. Biapical pleural thickening consistent scarring IMPRESSION: Prominent hiatal hernia.  No acute cardiopulmonary disease. Electronically Signed   By: Marcello Moores  Register   On: 04/07/2016 15:44       Assessment & Plan:   Problem List Items Addressed This Visit    GERD (gastroesophageal reflux disease)    Symptoms controlled on protonix.  Follow.        Hypercholesterolemia    Low cholesterol diet and exercise.  Follow lipid panel.  LDL just checked 58.  Triglycerides 177.        Hyperglycemia    Low carb diet and exercise.  Recent a1c 5.4.  Follow met b and a1c.       Hypertension    Blood  Pressure on recheck improved.  Follow pressures.  Continue same medication regimen.  Follow pressures.  Follow metabolic panel.        Hypothyroidism    On thyroid replacement.  Follow tsh.       Status post left partial knee replacement    Doing well s/p surgery.  Knees doing well.  Continue to follow up with ortho.        Other Visit Diagnoses    Decreased appetite    -  Primary   Decreased appetitie noticed after surgery.  off pain medication now.   follow.  get her back in soon to reassess.     Encounter for immunization       Relevant Orders   Flu vaccine HIGH DOSE PF (Completed)       Einar Pheasant, MD

## 2016-07-09 NOTE — Progress Notes (Signed)
Pre visit review using our clinic review tool, if applicable. No additional management support is needed unless otherwise documented below in the visit note. 

## 2016-07-10 ENCOUNTER — Encounter: Payer: Self-pay | Admitting: Internal Medicine

## 2016-07-10 NOTE — Assessment & Plan Note (Signed)
Symptoms controlled on protonix.  Follow.   

## 2016-07-10 NOTE — Assessment & Plan Note (Signed)
On thyroid replacement.  Follow tsh.  

## 2016-07-10 NOTE — Assessment & Plan Note (Signed)
Doing well s/p surgery.  Knees doing well.  Continue to follow up with ortho.

## 2016-07-10 NOTE — Assessment & Plan Note (Signed)
Low cholesterol diet and exercise.  Follow lipid panel.  LDL just checked 58.  Triglycerides 177.

## 2016-07-10 NOTE — Assessment & Plan Note (Signed)
Blood  Pressure on recheck improved.  Follow pressures.  Continue same medication regimen.  Follow pressures.  Follow metabolic panel.

## 2016-07-10 NOTE — Assessment & Plan Note (Signed)
Low carb diet and exercise.  Recent a1c 5.4.  Follow met b and a1c.

## 2016-07-16 DIAGNOSIS — H2513 Age-related nuclear cataract, bilateral: Secondary | ICD-10-CM | POA: Diagnosis not present

## 2016-07-19 DIAGNOSIS — Z96652 Presence of left artificial knee joint: Secondary | ICD-10-CM | POA: Diagnosis not present

## 2016-07-19 DIAGNOSIS — M1712 Unilateral primary osteoarthritis, left knee: Secondary | ICD-10-CM | POA: Diagnosis not present

## 2016-07-31 ENCOUNTER — Other Ambulatory Visit: Payer: Self-pay | Admitting: Internal Medicine

## 2016-08-26 ENCOUNTER — Other Ambulatory Visit: Payer: Self-pay | Admitting: Internal Medicine

## 2016-09-03 DIAGNOSIS — N94810 Vulvar vestibulitis: Secondary | ICD-10-CM | POA: Diagnosis not present

## 2016-09-03 DIAGNOSIS — N6011 Diffuse cystic mastopathy of right breast: Secondary | ICD-10-CM | POA: Diagnosis not present

## 2016-09-03 DIAGNOSIS — Z780 Asymptomatic menopausal state: Secondary | ICD-10-CM | POA: Diagnosis not present

## 2016-09-03 DIAGNOSIS — Z79890 Hormone replacement therapy (postmenopausal): Secondary | ICD-10-CM | POA: Diagnosis not present

## 2016-09-03 DIAGNOSIS — R102 Pelvic and perineal pain: Secondary | ICD-10-CM | POA: Diagnosis not present

## 2016-09-03 DIAGNOSIS — Z1231 Encounter for screening mammogram for malignant neoplasm of breast: Secondary | ICD-10-CM | POA: Diagnosis not present

## 2016-09-09 ENCOUNTER — Ambulatory Visit: Payer: PRIVATE HEALTH INSURANCE | Admitting: Internal Medicine

## 2016-09-26 ENCOUNTER — Other Ambulatory Visit: Payer: Self-pay | Admitting: Internal Medicine

## 2016-10-14 ENCOUNTER — Other Ambulatory Visit: Payer: Self-pay | Admitting: Internal Medicine

## 2016-10-26 ENCOUNTER — Ambulatory Visit (INDEPENDENT_AMBULATORY_CARE_PROVIDER_SITE_OTHER): Payer: Medicare Other | Admitting: Internal Medicine

## 2016-10-26 ENCOUNTER — Encounter: Payer: Self-pay | Admitting: Internal Medicine

## 2016-10-26 ENCOUNTER — Ambulatory Visit: Payer: PRIVATE HEALTH INSURANCE

## 2016-10-26 VITALS — BP 136/78 | HR 70 | Temp 98.3°F | Ht 62.0 in | Wt 151.4 lb

## 2016-10-26 DIAGNOSIS — E7800 Pure hypercholesterolemia, unspecified: Secondary | ICD-10-CM

## 2016-10-26 DIAGNOSIS — I10 Essential (primary) hypertension: Secondary | ICD-10-CM

## 2016-10-26 DIAGNOSIS — Z96652 Presence of left artificial knee joint: Secondary | ICD-10-CM | POA: Diagnosis not present

## 2016-10-26 DIAGNOSIS — R739 Hyperglycemia, unspecified: Secondary | ICD-10-CM

## 2016-10-26 DIAGNOSIS — E041 Nontoxic single thyroid nodule: Secondary | ICD-10-CM | POA: Diagnosis not present

## 2016-10-26 DIAGNOSIS — B351 Tinea unguium: Secondary | ICD-10-CM | POA: Diagnosis not present

## 2016-10-26 DIAGNOSIS — R0989 Other specified symptoms and signs involving the circulatory and respiratory systems: Secondary | ICD-10-CM | POA: Diagnosis not present

## 2016-10-26 DIAGNOSIS — M7989 Other specified soft tissue disorders: Secondary | ICD-10-CM

## 2016-10-26 DIAGNOSIS — E039 Hypothyroidism, unspecified: Secondary | ICD-10-CM

## 2016-10-26 MED ORDER — CICLOPIROX 8 % EX SOLN: Freq: Every day | CUTANEOUS | 0 refills | Status: DC

## 2016-10-26 NOTE — Progress Notes (Signed)
Pre-visit discussion using our clinic review tool. No additional management support is needed unless otherwise documented below in the visit note.  

## 2016-10-26 NOTE — Progress Notes (Signed)
[name], female   DOB: [birth year], 83 y.o.   MRN: [id number]   Subjective:    [name], female    DOB: [birth year], 83 y.o.   MRN: [id number]  HPI  Patient here for a scheduled follow up. S/p left partial knee replacement.  Seeing Dr Roland Rack.  Doing well.  Just evaluated by gyn.  Stable.  Overall doing well.  No chest pain.  No sob. No acid reflux.  No abdominal pain or cramping.  Bowels stable.  Appetite better.  Has some nail fungus.  Request refill for penlac.     Past Medical History:  Diagnosis Date  . Cancer of left breast, stage 0 .0  . Complication of anesthesia   . Diverticulosis   . Dysphagia   . Family history of adverse reaction to anesthesia    brother had a hard time waking up with hernia surgery as a child.  Marland Kitchen GERD (gastroesophageal reflux disease)   . Headache(784.0)    h/o recurrent vascular headaches. no longer gets these  . Hypercholesterolemia   . Hypertension   . Hypothyroidism   . IBS (irritable bowel syndrome)   . Leg swelling   . Neck fullness 2016   dr. Nicki Reaper reviewed and found it to be nothing.  prinivil seemed to cause face and neck and tongue swelling  . PONV (postoperative nausea and vomiting)   . Urinary tract bacterial infections    Past Surgical History:  Procedure Laterality Date  . ABDOMINAL HYSTERECTOMY    . APPENDECTOMY    . CHOLECYSTECTOMY  1990  . CYSTOSCOPY     x2  . ESOPHAGOGASTRODUODENOSCOPY (EGD) WITH PROPOFOL N/A 08/28/2015   Procedure: ESOPHAGOGASTRODUODENOSCOPY (EGD) WITH PROPOFOL;  Surgeon: Hulen Luster, MD;  Location: [facility] ENDOSCOPY;  Service: Gastroenterology;  Laterality: N/A;  . PARTIAL HYSTERECTOMY    . PARTIAL KNEE ARTHROPLASTY Left 04/15/2016   Procedure: UNICOMPARTMENTAL KNEE;  Surgeon: Corky Mull, MD;  Location: [facility] ORS;  Service: Orthopedics;  Laterality: Left;  . TONSILLECTOMY     Family History  Problem Relation Age of Onset  . Heart disease Father   . Heart disease Mother   .  Breast cancer Neg Hx   . Colon cancer Neg Hx    Social History   Social History  . Marital status: Married    Spouse name: N/A  . Number of children: 2  . Years of education: N/A   Occupational History  . retired    Social History Main Topics  . Smoking status: Never Smoker  . Smokeless tobacco: Never Used  . Alcohol use No     Comment: seldom, a glass of wine 1-3 per year.  . Drug use: No  . Sexual activity: Not Currently   Other Topics Concern  . None   Social History Narrative  . None    Outpatient Encounter Prescriptions as of 10/26/2016  Medication Sig  . amitriptyline (ELAVIL) 25 MG tablet Take 25 mg by mouth at bedtime.  Marland Kitchen amLODipine (NORVASC) 10 MG tablet TAKE 1 TABLET DAILY  . aspirin 81 MG tablet Take 81 mg by mouth daily.  Marland Kitchen atorvastatin (LIPITOR) 40 MG tablet TAKE 1 TABLET DAILY  . Calcium Carbonate (CALCIUM 600 PO) Take 1 tablet by mouth daily.  Marland Kitchen enoxaparin (LOVENOX) 40 MG/0.4ML injection Inject 0.4 mLs (40 mg total) into the skin daily.  Marland Kitchen estrogens, conjugated, (PREMARIN) 0.9 MG tablet Take 0.9 mg by mouth daily.   . metoprolol succinate (  TOPROL-XL) 50 MG 24 hr tablet TAKE 1 TABLET TWICE A DAY  WITH OR IMMEDIATELY        FOLLOWING A MEAL  . metoprolol succinate (TOPROL-XL) 50 MG 24 hr tablet TAKE 1 TABLET TWICE A DAY  WITH OR IMMEDIATELY        FOLLOWING A MEAL  . pantoprazole (PROTONIX) 40 MG tablet Take 1 tablet (40 mg total) by mouth daily.  Marland Kitchen SYNTHROID 50 MCG tablet TAKE 1 TABLET DAILY BEFORE BREAKFAST  . Vitamin D, Cholecalciferol, 1000 units CAPS Take 1 capsule by mouth daily.  . ciclopirox (PENLAC) 8 % solution Apply topically at bedtime. Apply over nail and surrounding skin. Apply daily over previous coat. After seven (7) days, may remove with alcohol and continue cycle.   No facility-administered encounter medications on file as of 10/26/2016.     Review of Systems  Constitutional: Negative for appetite change and unexpected weight change.  HENT:  Negative for congestion and sinus pressure.   Respiratory: Negative for cough, chest tightness and shortness of breath.   Cardiovascular: Negative for chest pain, palpitations and leg swelling.  Gastrointestinal: Negative for abdominal pain, diarrhea, nausea and vomiting.  Genitourinary: Negative for difficulty urinating and dysuria.  Musculoskeletal: Negative for back pain and joint swelling.  Skin: Negative for color change and rash.  Neurological: Negative for dizziness, light-headedness and headaches.  Psychiatric/Behavioral: Negative for agitation and dysphoric mood.       Objective:     Blood pressure rechecked by me:  136/78  Physical Exam  Constitutional: She appears well-developed and well-nourished. No distress.  HENT:  Nose: Nose normal.  Mouth/Throat: Oropharynx is clear and moist.  Neck: Neck supple. No thyromegaly present.  Cardiovascular: Normal rate and regular rhythm.   Pulmonary/Chest: Breath sounds normal. No respiratory distress. She has no wheezes.  Abdominal: Soft. Bowel sounds are normal. There is no tenderness.  Musculoskeletal: She exhibits no edema or tenderness.  Lymphadenopathy:    She has no cervical adenopathy.  Skin: No rash noted. No erythema.  Psychiatric: She has a normal mood and affect. Her behavior is normal.    BP 136/78   Pulse 70   Temp 98.3 F (36.8 C) (Oral)   Ht _0  (1.575 m)   Wt 151 lb 6.4 oz (68.7 kg)   SpO2 96%   BMI 27.69 kg/m  Wt Readings from Last 3 Encounters:  10/26/16 151 lb 6.4 oz (68.7 kg)  07/09/16 150 lb (68 kg)  04/15/16 158 lb (71.7 kg)     Lab Results  Component Value Date   WBC 11.3 (H) 04/16/2016   HGB 12.7 04/16/2016   HCT 36.7 04/16/2016   PLT 173 04/16/2016   GLUCOSE 114 (H) 07/07/2016   CHOL 161 07/07/2016   TRIG 177.0 (H) 07/07/2016   HDL 67.20 07/07/2016   LDLDIRECT 64.9 12/08/2012   LDLCALC 58 07/07/2016   ALT 9 07/07/2016   AST 14 07/07/2016   NA 140 07/07/2016   K 4.8 07/07/2016    CL 103 07/07/2016   CREATININE 1.12 07/07/2016   BUN 14 07/07/2016   CO2 28 07/07/2016   TSH 2.78 08/21/2015   INR 1.03 04/07/2016   HGBA1C 5.4 07/07/2016    Dg Chest 2 View  Result Date: 04/07/2016 CLINICAL DATA:  Knee surgery.  Preoperative exam. EXAM: CHEST  2 VIEW COMPARISON:  No prior . FINDINGS: Mediastinum and hilar structures are normal. Prominence of sliding hiatal hernia. Prominent cardiac fat pads. Heart size normal. No focal infiltrate.  No pleural effusion or pneumothorax. Biapical pleural thickening consistent scarring IMPRESSION: Prominent hiatal hernia.  No acute cardiopulmonary disease. Electronically Signed   By: Marcello Moores  Register   On: 04/07/2016 15:44       Assessment & Plan:   Problem List Items Addressed This Visit    Hypercholesterolemia    On lipitor.  Low cholesterol diet and exercise.  Follow lipid panel and liver function tests.        Relevant Orders   Lipid panel   Hepatic function panel   Hyperglycemia    Low carb diet and exercise.  Follow met b and a1c.       Relevant Orders   Hemoglobin A1c   Hypertension    Blood pressure on recheck improved.  Follow pressures.  Same medication regimen.        Relevant Orders   CBC with Differential/Platelet   Basic metabolic panel   Hypothyroidism    On thyroid replacement.  Follow tsh.       Leg swelling    Compression hose.  Better.       Right carotid bruit    Had f/u carotid ultrasound 12/12/15.   No significant change.        Status post left partial knee replacement    Doing well s/p surgery.  Continue f/u with Dr Roland Rack.       Thyroid nodule    45m nodule found on thyroid ultrasound 07/2015.  Recommended f/u ultrasound in one year.  Need to schedule.        Relevant Orders   TSH    Other Visit Diagnoses    Nail fungus    -  Primary   rx given for penlac.    Relevant Medications   ciclopirox (PENLAC) 8 % solution       SEinar Pheasant MD

## 2016-11-01 ENCOUNTER — Ambulatory Visit (INDEPENDENT_AMBULATORY_CARE_PROVIDER_SITE_OTHER): Payer: Medicare Other

## 2016-11-01 ENCOUNTER — Encounter: Payer: Self-pay | Admitting: Internal Medicine

## 2016-11-01 VITALS — BP 128/72 | HR 61 | Temp 98.1°F | Resp 14 | Ht 61.5 in | Wt 151.8 lb

## 2016-11-01 DIAGNOSIS — Z0000 Encounter for general adult medical examination without abnormal findings: Secondary | ICD-10-CM | POA: Diagnosis not present

## 2016-11-01 NOTE — Assessment & Plan Note (Signed)
On lipitor.  Low cholesterol diet and exercise.  Follow lipid panel and liver function tests.   

## 2016-11-01 NOTE — Assessment & Plan Note (Signed)
Low carb diet and exercise.  Follow met b and a1c.  

## 2016-11-01 NOTE — Progress Notes (Signed)
Subjective:   Elizabeth Henderson is a 83 y.o. female who presents for Medicare Annual (Subsequent) preventive examination.  Review of Systems:  No ROS.  Medicare Wellness Visit.  Cardiac Risk Factors include: advanced age (>14men, >66 women);hypertension     Objective:     Vitals: BP 128/72 (BP Location: Left Arm, Patient Position: Sitting, Cuff Size: Normal)   Pulse 61   Temp 98.1 F (36.7 C) (Oral)   Resp 14   Ht 5' 1.5" (1.562 m)   Wt 151 lb 12.8 oz (68.9 kg)   SpO2 97%   BMI 28.22 kg/m   Body mass index is 28.22 kg/m.   Tobacco History  Smoking Status  . Never Smoker  Smokeless Tobacco  . Never Used     Counseling given: Not Answered   Past Medical History:  Diagnosis Date  . Cancer of left breast, stage 0 .0  . Complication of anesthesia   . Diverticulosis   . Dysphagia   . Family history of adverse reaction to anesthesia    brother had a hard time waking up with hernia surgery as a child.  Marland Kitchen GERD (gastroesophageal reflux disease)   . Headache(784.0)    h/o recurrent vascular headaches. no longer gets these  . Hypercholesterolemia   . Hypertension   . Hypothyroidism   . IBS (irritable bowel syndrome)   . Leg swelling   . Neck fullness 2016   dr. Nicki Reaper reviewed and found it to be nothing.  prinivil seemed to cause face and neck and tongue swelling  . PONV (postoperative nausea and vomiting)   . Urinary tract bacterial infections    Past Surgical History:  Procedure Laterality Date  . ABDOMINAL HYSTERECTOMY    . APPENDECTOMY    . CHOLECYSTECTOMY  1990  . CYSTOSCOPY     x2  . ESOPHAGOGASTRODUODENOSCOPY (EGD) WITH PROPOFOL N/A 08/28/2015   Procedure: ESOPHAGOGASTRODUODENOSCOPY (EGD) WITH PROPOFOL;  Surgeon: Hulen Luster, MD;  Location: [facility] ENDOSCOPY;  Service: Gastroenterology;  Laterality: N/A;  . PARTIAL HYSTERECTOMY    . PARTIAL KNEE ARTHROPLASTY Left 04/15/2016   Procedure: UNICOMPARTMENTAL KNEE;  Surgeon: Corky Mull, MD;  Location: [facility] ORS;   Service: Orthopedics;  Laterality: Left;  . TONSILLECTOMY     Family History  Problem Relation Age of Onset  . Heart disease Father   . Heart disease Mother   . Heart disease Brother   . Heart disease Brother   . Breast cancer Neg Hx   . Colon cancer Neg Hx    History  Sexual Activity  . Sexual activity: Not Currently    Outpatient Encounter Prescriptions as of 11/01/2016  Medication Sig  . amitriptyline (ELAVIL) 25 MG tablet Take 25 mg by mouth at bedtime.  Marland Kitchen amLODipine (NORVASC) 10 MG tablet TAKE 1 TABLET DAILY  . aspirin 81 MG tablet Take 81 mg by mouth daily.  Marland Kitchen atorvastatin (LIPITOR) 40 MG tablet TAKE 1 TABLET DAILY  . Calcium Carbonate (CALCIUM 600 PO) Take 1 tablet by mouth daily.  . ciclopirox (PENLAC) 8 % solution Apply topically at bedtime. Apply over nail and surrounding skin. Apply daily over previous coat. After seven (7) days, may remove with alcohol and continue cycle.  . estrogens, conjugated, (PREMARIN) 0.9 MG tablet Take 0.9 mg by mouth daily.   . metoprolol succinate (TOPROL-XL) 50 MG 24 hr tablet TAKE 1 TABLET TWICE A DAY  WITH OR IMMEDIATELY        FOLLOWING A MEAL  . metoprolol succinate (  TOPROL-XL) 50 MG 24 hr tablet TAKE 1 TABLET TWICE A DAY  WITH OR IMMEDIATELY        FOLLOWING A MEAL  . pantoprazole (PROTONIX) 40 MG tablet Take 1 tablet (40 mg total) by mouth daily.  Marland Kitchen SYNTHROID 50 MCG tablet TAKE 1 TABLET DAILY BEFORE BREAKFAST  . Vitamin D, Cholecalciferol, 1000 units CAPS Take 1 capsule by mouth daily.  . [DISCONTINUED] enoxaparin (LOVENOX) 40 MG/0.4ML injection Inject 0.4 mLs (40 mg total) into the skin daily.   No facility-administered encounter medications on file as of 11/01/2016.     Activities of Daily Living In your present state of health, do you have any difficulty performing the following activities: 11/01/2016 04/15/2016  Hearing? N N  Vision? N N  Difficulty concentrating or making decisions? N N  Walking or climbing stairs? N Y  Dressing  or bathing? N N  Doing errands, shopping? N N  Preparing Food and eating ? N -  Using the Toilet? N -  In the past six months, have you accidently leaked urine? N -  Do you have problems with loss of bowel control? N -  Managing your Medications? N -  Managing your Finances? N -  Housekeeping or managing your Housekeeping? N -  Some recent data might be hidden    Patient Care Team: Einar Pheasant, MD as PCP - General (Internal Medicine)    Assessment:    This is a routine wellness examination for [place]. The goal of the wellness visit is to assist the patient how to close the gaps in care and create a preventative care plan for the patient.   Taking calcium VIT D as appropriate/Osteoporosis risk reviewed.  Medications reviewed; taking without issues or barriers.  Safety issues reviewed; smoke detectors in the home. No firearms in the home. Wears seatbelts when driving or riding with others. No violence in the home.  No identified risk were noted; The patient was oriented x 3; appropriate in dress and manner and no objective failures at ADL's or IADL's.   BMI; discussed the importance of a healthy diet, water intake and exercise. Educational material provided.  HTN; followed by PCP.  TDAP vaccine deferred per patient preference for follow up with insurance. Educational material provided.  Patient Concerns: None at this time. Follow up with PCP as needed.  Exercise Activities and Dietary recommendations Current Exercise Habits: Home exercise routine (Chair/standing exercises), Type of exercise: stretching, Time (Minutes): 10, Frequency (Times/Week): 7, Weekly Exercise (Minutes/Week): 70, Intensity: Mild  Goals    . Healthy Lifestyle          Stay hydrated! Drink plenty of fluids/water Stay active and continue home exercises. Walk as tolerated Low carb foods       Fall Risk Fall Risk  11/01/2016 07/09/2016 10/27/2015 12/23/2014 06/20/2014  Falls in the past year?  No No No No No   Depression Screen PHQ 2/9 Scores 11/01/2016 07/09/2016 10/27/2015 12/23/2014  PHQ - 2 Score 0 0 0 0     Cognitive Function MMSE - Mini Mental State Exam 11/01/2016 10/27/2015  Orientation to time 5 5  Orientation to Place 5 5  Registration 3 3  Attention/ Calculation 5 5  Recall 3 3  Language- name 2 objects 2 2  Language- repeat 1 1  Language- follow 3 step command 3 3  Language- read & follow direction 1 1  Write a sentence 1 1  Copy design 1 1  Total score 30 30  Immunization History  Administered Date(s) Administered  . Influenza Split 07/17/2013  . Influenza, High Dose Seasonal PF 07/09/2016  . Influenza,inj,Quad PF,36+ Mos 06/20/2014, 07/02/2015  . Pneumococcal Conjugate-13 12/17/2013  . Zoster 12/23/2011   Screening Tests Health Maintenance  Topic Date Due  . TETANUS/TDAP  07/24/1951  . MAMMOGRAM  09/03/2017  . INFLUENZA VACCINE  Completed  . DEXA SCAN  Completed  . ZOSTAVAX  Addressed  . PNA vac Low Risk Adult  Addressed      Plan:    End of life planning; Advance aging; Advanced directives discussed. No HCPOA/Living Will.  Additional information declined at this time.  Medicare Attestation I have personally reviewed: The patient's medical and social history Their use of alcohol, tobacco or illicit drugs Their current medications and supplements The patient's functional ability including ADLs,fall risks, home safety risks, cognitive, and hearing and visual impairment Diet and physical activities Evidence for depression   The patient's weight, height, BMI, and visual acuity have been recorded in the chart.  I have made referrals and provided education to the patient based on review of the above and I have provided the patient with a written personalized care plan for preventive services.    During the course of the visit the patient was educated and counseled about the following appropriate screening and preventive services:    Vaccines to include Pneumoccal, Influenza, Hepatitis B, Td, Zostavax, HCV  Electrocardiogram  Cardiovascular Disease  Colorectal cancer screening  Bone density screening  Diabetes screening  Glaucoma screening  Mammography/PAP  Nutrition counseling   Patient Instructions (the written plan) was given to the patient.   Varney Biles, LPN  [phone]

## 2016-11-01 NOTE — Assessment & Plan Note (Signed)
Blood pressure on recheck improved.  Follow pressures.  Same medication regimen.   

## 2016-11-01 NOTE — Progress Notes (Signed)
Care was provided under my supervision. I agree with the management as indicated in the note.  Jarica Plass DO  

## 2016-11-01 NOTE — Assessment & Plan Note (Signed)
41mm nodule found on thyroid ultrasound 07/2015.  Recommended f/u ultrasound in one year.  Need to schedule.

## 2016-11-01 NOTE — Patient Instructions (Addendum)
  Elizabeth Henderson , Thank you for taking time to come for your Medicare Wellness Visit. I appreciate your ongoing commitment to your health goals. Please review the following plan we discussed and let me know if I can assist you in the future.   Follow up with Dr. Nicki Reaper as needed.  These are the goals we discussed: Goals    . Healthy Lifestyle          Stay hydrated! Drink plenty of fluids/water Stay active and continue home exercises. Walk as tolerated Low carb foods        This is a list of the screening recommended for you and due dates:  Health Maintenance  Topic Date Due  . Tetanus Vaccine  07/24/1951  . Mammogram  09/03/2017  . Flu Shot  Completed  . DEXA scan (bone density measurement)  Completed  . Shingles Vaccine  Addressed  . Pneumonia vaccines  Addressed

## 2016-11-01 NOTE — Assessment & Plan Note (Signed)
Compression hose.  Better.

## 2016-11-01 NOTE — Assessment & Plan Note (Signed)
On thyroid replacement.  Follow tsh.  

## 2016-11-01 NOTE — Assessment & Plan Note (Signed)
Doing well s/p surgery.  Continue f/u with Dr Roland Rack.

## 2016-11-01 NOTE — Assessment & Plan Note (Signed)
Had f/u carotid ultrasound 12/12/15.  No significant change.   

## 2016-11-11 ENCOUNTER — Telehealth: Payer: Self-pay

## 2016-11-11 NOTE — Telephone Encounter (Signed)
Received notes from [facility] holding to abstract. Is there something specific you were looking for in notes that I can abstract? I am holding at my desk.

## 2016-11-11 NOTE — Telephone Encounter (Signed)
It is a lot I will hold until you get in office.

## 2016-11-11 NOTE — Telephone Encounter (Signed)
Just place the records in my folder for review and I will see what needs to be abstracted.  Thanks

## 2016-11-23 ENCOUNTER — Other Ambulatory Visit: Payer: Self-pay | Admitting: Internal Medicine

## 2016-12-18 ENCOUNTER — Other Ambulatory Visit: Payer: Self-pay | Admitting: Internal Medicine

## 2017-01-25 ENCOUNTER — Other Ambulatory Visit (INDEPENDENT_AMBULATORY_CARE_PROVIDER_SITE_OTHER): Payer: Medicare Other

## 2017-01-25 DIAGNOSIS — R739 Hyperglycemia, unspecified: Secondary | ICD-10-CM

## 2017-01-25 DIAGNOSIS — E041 Nontoxic single thyroid nodule: Secondary | ICD-10-CM

## 2017-01-25 DIAGNOSIS — E7800 Pure hypercholesterolemia, unspecified: Secondary | ICD-10-CM | POA: Diagnosis not present

## 2017-01-25 DIAGNOSIS — I10 Essential (primary) hypertension: Secondary | ICD-10-CM

## 2017-01-25 LAB — HEPATIC FUNCTION PANEL
ALT: 21 U/L (ref 0–35)
AST: 26 U/L (ref 0–37)
Albumin: 3.9 g/dL (ref 3.5–5.2)
Alkaline Phosphatase: 71 U/L (ref 39–117)
BILIRUBIN DIRECT: 0.1 mg/dL (ref 0.0–0.3)
TOTAL PROTEIN: 7.2 g/dL (ref 6.0–8.3)
Total Bilirubin: 0.5 mg/dL (ref 0.2–1.2)

## 2017-01-25 LAB — CBC WITH DIFFERENTIAL/PLATELET
BASOS ABS: 0.1 10*3/uL (ref 0.0–0.1)
Basophils Relative: 0.9 % (ref 0.0–3.0)
EOS ABS: 0.3 10*3/uL (ref 0.0–0.7)
Eosinophils Relative: 3.4 % (ref 0.0–5.0)
HEMATOCRIT: 39.8 % (ref 36.0–46.0)
HEMOGLOBIN: 13.4 g/dL (ref 12.0–15.0)
LYMPHS PCT: 17.6 % (ref 12.0–46.0)
Lymphs Abs: 1.3 10*3/uL (ref 0.7–4.0)
MCHC: 33.7 g/dL (ref 30.0–36.0)
MCV: 93.6 fl (ref 78.0–100.0)
Monocytes Absolute: 0.5 10*3/uL (ref 0.1–1.0)
Monocytes Relative: 6.1 % (ref 3.0–12.0)
Neutro Abs: 5.5 10*3/uL (ref 1.4–7.7)
Neutrophils Relative %: 72 % (ref 43.0–77.0)
PLATELETS: 217 10*3/uL (ref 150.0–400.0)
RBC: 4.25 Mil/uL (ref 3.87–5.11)
RDW: 12.7 % (ref 11.5–15.5)
WBC: 7.6 10*3/uL (ref 4.0–10.5)

## 2017-01-25 LAB — BASIC METABOLIC PANEL
BUN: 17 mg/dL (ref 6–23)
CALCIUM: 9.3 mg/dL (ref 8.4–10.5)
CO2: 26 mEq/L (ref 19–32)
Chloride: 104 mEq/L (ref 96–112)
Creatinine, Ser: 1.13 mg/dL (ref 0.40–1.20)
GFR: 48.93 mL/min — ABNORMAL LOW (ref >60.00)
GLUCOSE: 114 mg/dL — AB (ref 70–99)
Potassium: 4.5 mEq/L (ref 3.5–5.1)
SODIUM: 138 meq/L (ref 135–145)

## 2017-01-25 LAB — LIPID PANEL
CHOLESTEROL: 151 mg/dL (ref 0–200)
HDL: 78.7 mg/dL (ref >39.00)
LDL CALC: 45 mg/dL (ref 0–99)
NonHDL: 71.81
TRIGLYCERIDES: 135 mg/dL (ref 0.0–149.0)
Total CHOL/HDL Ratio: 2
VLDL: 27 mg/dL (ref 0.0–40.0)

## 2017-01-25 LAB — TSH: TSH: 3.12 u[IU]/mL (ref 0.35–4.50)

## 2017-01-25 LAB — HEMOGLOBIN A1C: Hgb A1c MFr Bld: 5.6 % (ref 4.6–6.5)

## 2017-01-27 ENCOUNTER — Encounter: Payer: Medicare Other | Admitting: Internal Medicine

## 2017-01-28 ENCOUNTER — Ambulatory Visit (INDEPENDENT_AMBULATORY_CARE_PROVIDER_SITE_OTHER): Payer: Medicare Other

## 2017-01-28 ENCOUNTER — Ambulatory Visit (INDEPENDENT_AMBULATORY_CARE_PROVIDER_SITE_OTHER): Payer: Medicare Other | Admitting: Internal Medicine

## 2017-01-28 ENCOUNTER — Encounter: Payer: Self-pay | Admitting: Internal Medicine

## 2017-01-28 VITALS — BP 124/72 | HR 63 | Temp 98.6°F | Resp 12 | Ht 61.42 in | Wt 153.6 lb

## 2017-01-28 DIAGNOSIS — R739 Hyperglycemia, unspecified: Secondary | ICD-10-CM

## 2017-01-28 DIAGNOSIS — E7800 Pure hypercholesterolemia, unspecified: Secondary | ICD-10-CM | POA: Diagnosis not present

## 2017-01-28 DIAGNOSIS — M5442 Lumbago with sciatica, left side: Secondary | ICD-10-CM

## 2017-01-28 DIAGNOSIS — R1310 Dysphagia, unspecified: Secondary | ICD-10-CM | POA: Diagnosis not present

## 2017-01-28 DIAGNOSIS — I10 Essential (primary) hypertension: Secondary | ICD-10-CM

## 2017-01-28 DIAGNOSIS — E039 Hypothyroidism, unspecified: Secondary | ICD-10-CM

## 2017-01-28 DIAGNOSIS — Z0000 Encounter for general adult medical examination without abnormal findings: Secondary | ICD-10-CM | POA: Diagnosis not present

## 2017-01-28 DIAGNOSIS — E041 Nontoxic single thyroid nodule: Secondary | ICD-10-CM

## 2017-01-28 DIAGNOSIS — Z23 Encounter for immunization: Secondary | ICD-10-CM | POA: Diagnosis not present

## 2017-01-28 DIAGNOSIS — K219 Gastro-esophageal reflux disease without esophagitis: Secondary | ICD-10-CM | POA: Diagnosis not present

## 2017-01-28 DIAGNOSIS — M47816 Spondylosis without myelopathy or radiculopathy, lumbar region: Secondary | ICD-10-CM | POA: Diagnosis not present

## 2017-01-28 MED ORDER — ATORVASTATIN CALCIUM 40 MG PO TABS: 40.0000 mg | ORAL_TABLET | Freq: Every day | ORAL | 3 refills | Status: DC

## 2017-01-28 MED ORDER — LEVOTHYROXINE SODIUM 50 MCG PO TABS: 50.0000 ug | ORAL_TABLET | Freq: Every day | ORAL | 3 refills | Status: DC

## 2017-01-28 NOTE — Progress Notes (Signed)
[name], female   DOB: 1932/07/01, 83 y.o.   MRN: [id number]   Subjective:    [name], female    DOB: Mar 01, 1932, 83 y.o.   MRN: [id number]  HPI  Patient with past history of hypertension, hypothyroidism, hypercholesterolemia and GERD.  She comes in today to follow up on these issues as well as for a complete physical exam.  She reports that she has noticed some persistent low back pain and pain radiating into her left leg (to ankle).  Pain mostly localized to her low back.  Occasionally down left leg.  Will occasionally take antiinflammatory for pain.  Also reports some intermittent coughing spells.  Reports notices when eating.  Feels like having some difficulty swallowing at times.  Has required dilation of her esophagus previously.  No chest pain.  No sob.  No abdominal pain.  Bowels moving.  Discussed labs.      Past Medical History:  Diagnosis Date  . Cancer of left breast, stage 0 .0  . Complication of anesthesia   . Diverticulosis   . Dysphagia   . Family history of adverse reaction to anesthesia    brother had a hard time waking up with hernia surgery as a child.  Marland Kitchen GERD (gastroesophageal reflux disease)   . Headache(784.0)    h/o recurrent vascular headaches. no longer gets these  . Hypercholesterolemia   . Hypertension   . Hypothyroidism   . IBS (irritable bowel syndrome)   . Leg swelling   . Neck fullness 2016   dr. Nicki Reaper reviewed and found it to be nothing.  prinivil seemed to cause face and neck and tongue swelling  . PONV (postoperative nausea and vomiting)   . Urinary tract bacterial infections    Past Surgical History:  Procedure Laterality Date  . ABDOMINAL HYSTERECTOMY    . APPENDECTOMY    . CHOLECYSTECTOMY  1990  . CYSTOSCOPY     x2  . ESOPHAGOGASTRODUODENOSCOPY (EGD) WITH PROPOFOL N/A 08/28/2015   Procedure: ESOPHAGOGASTRODUODENOSCOPY (EGD) WITH PROPOFOL;  Surgeon: Hulen Luster, MD;  Location: [facility] ENDOSCOPY;  Service:  Gastroenterology;  Laterality: N/A;  . PARTIAL HYSTERECTOMY    . PARTIAL KNEE ARTHROPLASTY Left 04/15/2016   Procedure: UNICOMPARTMENTAL KNEE;  Surgeon: Corky Mull, MD;  Location: [facility] ORS;  Service: Orthopedics;  Laterality: Left;  . TONSILLECTOMY     Family History  Problem Relation Age of Onset  . Heart disease Father   . Heart disease Mother   . Heart disease Brother   . Heart disease Brother   . Breast cancer Neg Hx   . Colon cancer Neg Hx    Social History   Social History  . Marital status: Married    Spouse name: N/A  . Number of children: 2  . Years of education: N/A   Occupational History  . retired    Social History Main Topics  . Smoking status: Never Smoker  . Smokeless tobacco: Never Used  . Alcohol use 0.0 oz/week     Comment: seldom, a glass of wine 1-3 per year.  . Drug use: No  . Sexual activity: Not Currently   Other Topics Concern  . None   Social History Narrative  . None    Outpatient Encounter Prescriptions as of 01/28/2017  Medication Sig  . amitriptyline (ELAVIL) 25 MG tablet Take 25 mg by mouth at bedtime.  Marland Kitchen amLODipine (NORVASC) 10 MG tablet TAKE 1 TABLET DAILY  . aspirin 81  MG tablet Take 81 mg by mouth daily.  Marland Kitchen atorvastatin (LIPITOR) 40 MG tablet Take 1 tablet (40 mg total) by mouth daily.  . Calcium Carbonate (CALCIUM 600 PO) Take 1 tablet by mouth daily.  . ciclopirox (PENLAC) 8 % solution Apply topically at bedtime. Apply over nail and surrounding skin. Apply daily over previous coat. After seven (7) days, may remove with alcohol and continue cycle.  . estrogens, conjugated, (PREMARIN) 0.9 MG tablet Take 0.9 mg by mouth daily.   Marland Kitchen levothyroxine (SYNTHROID) 50 MCG tablet Take 1 tablet (50 mcg total) by mouth daily before breakfast.  . metoprolol succinate (TOPROL-XL) 50 MG 24 hr tablet TAKE 1 TABLET TWICE A DAY  WITH OR IMMEDIATELY        FOLLOWING A MEAL  . pantoprazole (PROTONIX) 40 MG tablet TAKE 1 TABLET DAILY  . Vitamin D,  Cholecalciferol, 1000 units CAPS Take 1 capsule by mouth daily.  . [DISCONTINUED] atorvastatin (LIPITOR) 40 MG tablet TAKE 1 TABLET DAILY  . [DISCONTINUED] SYNTHROID 50 MCG tablet TAKE 1 TABLET DAILY BEFORE BREAKFAST   No facility-administered encounter medications on file as of 01/28/2017.     Review of Systems  Constitutional: Negative for appetite change and unexpected weight change.  HENT: Negative for congestion and sinus pressure.   Eyes: Negative for pain and visual disturbance.  Respiratory: Positive for cough. Negative for chest tightness and shortness of breath.   Cardiovascular: Negative for chest pain, palpitations and leg swelling.  Gastrointestinal: Negative for abdominal pain, diarrhea, nausea and vomiting.  Genitourinary: Negative for difficulty urinating and dysuria.  Musculoskeletal: Positive for back pain. Negative for joint swelling.  Skin: Negative for color change and rash.  Neurological: Negative for dizziness, light-headedness and headaches.  Hematological: Negative for adenopathy. Does not bruise/bleed easily.  Psychiatric/Behavioral: Negative for agitation and dysphoric mood.       Objective:    Physical Exam  Constitutional: She is oriented to person, place, and time. She appears well-developed and well-nourished. No distress.  HENT:  Nose: Nose normal.  Mouth/Throat: Oropharynx is clear and moist.  Eyes: Right eye exhibits no discharge. Left eye exhibits no discharge. No scleral icterus.  Neck: Neck supple. No thyromegaly present.  Cardiovascular: Normal rate and regular rhythm.   Pulmonary/Chest: Breath sounds normal. No accessory muscle usage. No tachypnea. No respiratory distress. She has no decreased breath sounds. She has no wheezes. She has no rhonchi. Right breast exhibits no inverted nipple, no mass, no nipple discharge and no tenderness (no axillary adenopathy). Left breast exhibits no inverted nipple, no mass, no nipple discharge and no tenderness  (no axilarry adenopathy).  Abdominal: Soft. Bowel sounds are normal. There is no tenderness.  Musculoskeletal: She exhibits no edema or tenderness.  No pain with SLR.  No focal weakness noted - lower extremities.    Lymphadenopathy:    She has no cervical adenopathy.  Neurological: She is alert and oriented to person, place, and time.  Skin: Skin is warm. No rash noted. No erythema.  Psychiatric: She has a normal mood and affect. Her behavior is normal.    BP 124/72 (BP Location: Left Arm, Patient Position: Sitting, Cuff Size: Normal)   Pulse 63   Temp 98.6 F (37 C) (Oral)   Resp 12   Ht 5' 1.42" (1.56 m)   Wt 153 lb 9.6 oz (69.7 kg)   SpO2 96%   BMI 28.63 kg/m  Wt Readings from Last 3 Encounters:  01/28/17 153 lb 9.6 oz (69.7 kg)  11/01/16 151 lb 12.8 oz (68.9 kg)  10/26/16 151 lb 6.4 oz (68.7 kg)     Lab Results  Component Value Date   WBC 7.6 01/25/2017   HGB 13.4 01/25/2017   HCT 39.8 01/25/2017   PLT 217.0 01/25/2017   GLUCOSE 114 (H) 01/25/2017   CHOL 151 01/25/2017   TRIG 135.0 01/25/2017   HDL 78.70 01/25/2017   LDLDIRECT 64.9 12/08/2012   LDLCALC 45 01/25/2017   ALT 21 01/25/2017   AST 26 01/25/2017   NA 138 01/25/2017   K 4.5 01/25/2017   CL 104 01/25/2017   CREATININE 1.13 01/25/2017   BUN 17 01/25/2017   CO2 26 01/25/2017   TSH 3.12 01/25/2017   INR 1.03 04/07/2016   HGBA1C 5.6 01/25/2017    Dg Chest 2 View  Result Date: 04/07/2016 CLINICAL DATA:  Knee surgery.  Preoperative exam. EXAM: CHEST  2 VIEW COMPARISON:  No prior . FINDINGS: Mediastinum and hilar structures are normal. Prominence of sliding hiatal hernia. Prominent cardiac fat pads. Heart size normal. No focal infiltrate. No pleural effusion or pneumothorax. Biapical pleural thickening consistent scarring IMPRESSION: Prominent hiatal hernia.  No acute cardiopulmonary disease. Electronically Signed   By: Marcello Moores  Register   On: 04/07/2016 15:44       Assessment & Plan:   Problem List  Items Addressed This Visit    Back pain - Primary    Low back pain and occasional pain extending down left leg.  Tylenol as directed.  Check xray.        Relevant Orders   DG Lumbar Spine 2-3 Views (Completed)   Dysphagia    Has seen GI previously.  S/p previous dilatation.  On protonix.  Feels having some return of symptoms.  Discussed small bites.  Chew food well.  Refer back to GI for evaluation.  Feel contributing to cough.  Had cxr 03/2016 - ok.        Relevant Orders   Ambulatory referral to Gastroenterology   GERD (gastroesophageal reflux disease)    On protonix.  See above.       Relevant Orders   Ambulatory referral to Gastroenterology   Health care maintenance    Physical today 01/28/17.  Colonoscopy 02/21/12.  Mammogram with ultrasound 09/03/16 - Birads II.  Recommended f/u in one year.        Hypercholesterolemia    On lipitor.  Low cholesterol diet and exercise.  Follow lipid panel and liver function tests.   Lab Results  Component Value Date   CHOL 151 01/25/2017   HDL 78.70 01/25/2017   LDLCALC 45 01/25/2017   LDLDIRECT 64.9 12/08/2012   TRIG 135.0 01/25/2017   CHOLHDL 2 01/25/2017        Relevant Medications   atorvastatin (LIPITOR) 40 MG tablet   Other Relevant Orders   Hepatic function panel   Lipid panel   Hyperglycemia    Low carb diet and exercise.  Follow met b and a1c.   Lab Results  Component Value Date   HGBA1C 5.6 01/25/2017        Relevant Orders   Hemoglobin A1c   Hypertension    Blood pressure under good control.  Continue same medication regimen.  Follow pressures.  Follow metabolic panel.        Relevant Medications   atorvastatin (LIPITOR) 40 MG tablet   Other Relevant Orders   Basic metabolic panel   Hypothyroidism    On thyroid replacement.  Follow tsh.  Relevant Medications   levothyroxine (SYNTHROID) 50 MCG tablet   Thyroid nodule    Thyroid ultrasound previously revealed 66m nodule.  Needs f/u thyroid ultrasound.   Need to schedule.        Relevant Medications   levothyroxine (SYNTHROID) 50 MCG tablet    Other Visit Diagnoses    Need for pneumococcal vaccination       Relevant Orders   Pneumococcal polysaccharide vaccine 23-valent greater than or equal to 2yo subcutaneous/IM (Completed)       SEinar Pheasant MD

## 2017-01-29 NOTE — Assessment & Plan Note (Signed)
Blood pressure under good control.  Continue same medication regimen.  Follow pressures.  Follow metabolic panel.   

## 2017-01-29 NOTE — Assessment & Plan Note (Signed)
Has seen GI previously.  S/p previous dilatation.  On protonix.  Feels having some return of symptoms.  Discussed small bites.  Chew food well.  Refer back to GI for evaluation.  Feel contributing to cough.  Had cxr 03/2016 - ok.

## 2017-01-29 NOTE — Assessment & Plan Note (Signed)
On protonix.  See above.

## 2017-01-29 NOTE — Assessment & Plan Note (Signed)
Low back pain and occasional pain extending down left leg.  Tylenol as directed.  Check xray.

## 2017-01-29 NOTE — Assessment & Plan Note (Signed)
On lipitor.  Low cholesterol diet and exercise.  Follow lipid panel and liver function tests.   Lab Results  Component Value Date   CHOL 151 01/25/2017   HDL 78.70 01/25/2017   LDLCALC 45 01/25/2017   LDLDIRECT 64.9 12/08/2012   TRIG 135.0 01/25/2017   CHOLHDL 2 01/25/2017

## 2017-01-29 NOTE — Assessment & Plan Note (Signed)
On thyroid replacement.  Follow tsh.  

## 2017-01-29 NOTE — Assessment & Plan Note (Signed)
Thyroid ultrasound previously revealed 6 mm nodule.  Needs f/u thyroid ultrasound.  Need to schedule.   

## 2017-01-29 NOTE — Assessment & Plan Note (Signed)
Low carb diet and exercise.  Follow met b and a1c.   Lab Results  Component Value Date   HGBA1C 5.6 01/25/2017   

## 2017-01-29 NOTE — Assessment & Plan Note (Signed)
Physical today 01/28/17.  Colonoscopy 02/21/12.  Mammogram with ultrasound 09/03/16 - Birads II.  Recommended f/u in one year.

## 2017-01-31 ENCOUNTER — Telehealth: Payer: Self-pay

## 2017-01-31 DIAGNOSIS — B351 Tinea unguium: Secondary | ICD-10-CM

## 2017-01-31 NOTE — Telephone Encounter (Signed)
Patient states that the medication that you gave her for her nail fungus helped with fingernail but not at all for toenail. Wanted to know if their was something stronger you could give.

## 2017-02-01 NOTE — Telephone Encounter (Signed)
Please call pt at [phone]

## 2017-02-01 NOTE — Telephone Encounter (Signed)
Patient is ok with going to [place]. Informed that I would let you know she is ok with referral

## 2017-02-01 NOTE — Telephone Encounter (Signed)
There is oral medication that you have to monitor liver function.  Given persistent, I would like to refer her to podiatry.  If agreeable, let me know and I will place the order for the referral.

## 2017-02-01 NOTE — Telephone Encounter (Signed)
Left message to return call to our office.  

## 2017-02-02 DIAGNOSIS — R1310 Dysphagia, unspecified: Secondary | ICD-10-CM | POA: Diagnosis not present

## 2017-02-02 NOTE — Telephone Encounter (Signed)
Order placed for podiatry referral.   

## 2017-02-03 ENCOUNTER — Other Ambulatory Visit: Payer: Self-pay | Admitting: Gastroenterology

## 2017-02-03 DIAGNOSIS — R1310 Dysphagia, unspecified: Secondary | ICD-10-CM

## 2017-02-09 ENCOUNTER — Ambulatory Visit
Admission: RE | Admit: 2017-02-09 | Discharge: 2017-02-09 | Disposition: A | Discharge status: Home / Self Care | Payer: Medicare Other | Source: Ambulatory Visit | Attending: Gastroenterology | Admitting: Gastroenterology

## 2017-02-09 ENCOUNTER — Encounter: Payer: Self-pay | Admitting: Surgery

## 2017-02-09 DIAGNOSIS — K449 Diaphragmatic hernia without obstruction or gangrene: Secondary | ICD-10-CM | POA: Diagnosis not present

## 2017-02-09 DIAGNOSIS — R1310 Dysphagia, unspecified: Secondary | ICD-10-CM | POA: Diagnosis not present

## 2017-02-09 DIAGNOSIS — K219 Gastro-esophageal reflux disease without esophagitis: Secondary | ICD-10-CM | POA: Insufficient documentation

## 2017-02-09 DIAGNOSIS — K224 Dyskinesia of esophagus: Secondary | ICD-10-CM | POA: Insufficient documentation

## 2017-03-07 DIAGNOSIS — B351 Tinea unguium: Secondary | ICD-10-CM | POA: Diagnosis not present

## 2017-03-10 ENCOUNTER — Telehealth: Payer: Self-pay | Admitting: Internal Medicine

## 2017-03-10 NOTE — Telephone Encounter (Signed)
Pt called requesting a refill on her ciclopirox (PENLAC) 8 % solution . Pt stated that she did see Dr. Caryl Comes and he advised to continue taking this medication. Please advise, thank you!  Pharmacy - [id number] - [place], [place] - [address]  Call pt @ [phone]

## 2017-03-11 ENCOUNTER — Other Ambulatory Visit: Payer: Self-pay

## 2017-03-11 MED ORDER — CICLOPIROX 8 % EX SOLN: Freq: Every day | CUTANEOUS | 0 refills | Status: DC

## 2017-03-11 NOTE — Telephone Encounter (Signed)
Refill sent.

## 2017-03-11 NOTE — Telephone Encounter (Signed)
Not able to fax put in your red folder to sign so that it can be faxed

## 2017-03-13 NOTE — Telephone Encounter (Signed)
rx signed and placed in box.   

## 2017-03-14 NOTE — Telephone Encounter (Signed)
faxed

## 2017-04-04 ENCOUNTER — Encounter: Payer: Self-pay | Admitting: *Deleted

## 2017-04-04 ENCOUNTER — Ambulatory Visit: Payer: Medicare Other | Admitting: Anesthesiology

## 2017-04-04 ENCOUNTER — Encounter
Admission: RE | Disposition: A | Discharge status: Home / Self Care | Payer: Self-pay | Source: Ambulatory Visit | Attending: Gastroenterology

## 2017-04-04 ENCOUNTER — Ambulatory Visit
Admission: RE | Admit: 2017-04-04 | Discharge: 2017-04-04 | Disposition: A | Discharge status: Home / Self Care | Payer: Medicare Other | Source: Ambulatory Visit | Attending: Gastroenterology | Admitting: Gastroenterology

## 2017-04-04 DIAGNOSIS — E039 Hypothyroidism, unspecified: Secondary | ICD-10-CM | POA: Insufficient documentation

## 2017-04-04 DIAGNOSIS — K219 Gastro-esophageal reflux disease without esophagitis: Secondary | ICD-10-CM | POA: Insufficient documentation

## 2017-04-04 DIAGNOSIS — K259 Gastric ulcer, unspecified as acute or chronic, without hemorrhage or perforation: Secondary | ICD-10-CM | POA: Diagnosis not present

## 2017-04-04 DIAGNOSIS — Z882 Allergy status to sulfonamides status: Secondary | ICD-10-CM | POA: Diagnosis not present

## 2017-04-04 DIAGNOSIS — Z888 Allergy status to other drugs, medicaments and biological substances status: Secondary | ICD-10-CM | POA: Diagnosis not present

## 2017-04-04 DIAGNOSIS — K2970 Gastritis, unspecified, without bleeding: Secondary | ICD-10-CM | POA: Diagnosis not present

## 2017-04-04 DIAGNOSIS — Z853 Personal history of malignant neoplasm of breast: Secondary | ICD-10-CM | POA: Diagnosis not present

## 2017-04-04 DIAGNOSIS — Z91041 Radiographic dye allergy status: Secondary | ICD-10-CM | POA: Insufficient documentation

## 2017-04-04 DIAGNOSIS — Z6827 Body mass index (BMI) 27.0-27.9, adult: Secondary | ICD-10-CM | POA: Insufficient documentation

## 2017-04-04 DIAGNOSIS — R1310 Dysphagia, unspecified: Secondary | ICD-10-CM | POA: Insufficient documentation

## 2017-04-04 DIAGNOSIS — K449 Diaphragmatic hernia without obstruction or gangrene: Secondary | ICD-10-CM | POA: Diagnosis not present

## 2017-04-04 DIAGNOSIS — Z79899 Other long term (current) drug therapy: Secondary | ICD-10-CM | POA: Insufficient documentation

## 2017-04-04 DIAGNOSIS — I10 Essential (primary) hypertension: Secondary | ICD-10-CM | POA: Diagnosis not present

## 2017-04-04 DIAGNOSIS — K224 Dyskinesia of esophagus: Secondary | ICD-10-CM | POA: Diagnosis not present

## 2017-04-04 HISTORY — DX: Esophageal obstruction: K22.2

## 2017-04-04 HISTORY — PX: ESOPHAGOGASTRODUODENOSCOPY (EGD) WITH PROPOFOL: SHX5813

## 2017-04-04 SURGERY — ESOPHAGOGASTRODUODENOSCOPY (EGD) WITH PROPOFOL
Anesthesia: General

## 2017-04-04 MED ORDER — PROPOFOL 10 MG/ML IV BOLUS
INTRAVENOUS | Status: DC | PRN
  Administered 2017-04-04: 20 mg via INTRAVENOUS
  Administered 2017-04-04: 50 mg via INTRAVENOUS

## 2017-04-04 MED ORDER — SODIUM CHLORIDE 0.9 % IV SOLN
INTRAVENOUS | Status: DC
  Administered 2017-04-04: 12:00:00 via INTRAVENOUS

## 2017-04-04 MED ORDER — PROPOFOL 10 MG/ML IV BOLUS
INTRAVENOUS | Status: AC
  Filled 2017-04-04: qty 20

## 2017-04-04 MED ORDER — PROPOFOL 500 MG/50ML IV EMUL
INTRAVENOUS | Status: DC | PRN
  Administered 2017-04-04: 100 ug/kg/min via INTRAVENOUS

## 2017-04-04 MED ORDER — AMPICILLIN SODIUM 2 G IJ SOLR
INTRAMUSCULAR | Status: AC
  Administered 2017-04-04: 2 g via INTRAVENOUS
  Filled 2017-04-04: qty 2000

## 2017-04-04 MED ORDER — GLYCOPYRROLATE 0.2 MG/ML IJ SOLN
INTRAMUSCULAR | Status: AC
  Filled 2017-04-04: qty 1

## 2017-04-04 MED ORDER — GLYCOPYRROLATE 0.2 MG/ML IJ SOLN
INTRAMUSCULAR | Status: DC | PRN
  Administered 2017-04-04: 0.2 mg via INTRAVENOUS

## 2017-04-04 MED ORDER — SODIUM CHLORIDE 0.9 % IV SOLN: INTRAVENOUS | Status: DC

## 2017-04-04 MED ORDER — SODIUM CHLORIDE 0.9 % IV SOLN
2.0000 g | Freq: Once | INTRAVENOUS | Status: AC
  Administered 2017-04-04: 2 g via INTRAVENOUS

## 2017-04-04 MED ORDER — PROPOFOL 500 MG/50ML IV EMUL
INTRAVENOUS | Status: AC
  Filled 2017-04-04: qty 50

## 2017-04-04 NOTE — Anesthesia Post-op Follow-up Note (Cosign Needed)
Anesthesia QCDR form completed.        

## 2017-04-04 NOTE — Anesthesia Preprocedure Evaluation (Signed)
Anesthesia Evaluation    Airway Mallampati: III       Dental  (+) Teeth Intact   Pulmonary     + decreased breath sounds      Cardiovascular Exercise Tolerance: Good hypertension, Pt. on medications and Pt. on home beta blockers  Rhythm:Regular     Neuro/Psych    GI/Hepatic Neg liver ROS, GERD  ,  Endo/Other  Hypothyroidism Morbid obesity  Renal/GU negative Renal ROS     Musculoskeletal   Abdominal (+) + obese,   Peds  Hematology   Anesthesia Other Findings   Reproductive/Obstetrics                             Anesthesia Physical Anesthesia Plan  ASA: III  Anesthesia Plan: General   Post-op Pain Management:    Induction: Intravenous  PONV Risk Score and Plan: Ondansetron  Airway Management Planned:   Additional Equipment:   Intra-op Plan:   Post-operative Plan:   Informed Consent: I have reviewed the patients History and Physical, chart, labs and discussed the procedure including the risks, benefits and alternatives for the proposed anesthesia with the patient or authorized representative who has indicated his/her understanding and acceptance.     Plan Discussed with: CRNA  Anesthesia Plan Comments:         Anesthesia Quick Evaluation

## 2017-04-04 NOTE — Anesthesia Postprocedure Evaluation (Signed)
Anesthesia Post Note  Patient: Elizabeth Henderson  Procedure(s) Performed: Procedure(s) (LRB): ESOPHAGOGASTRODUODENOSCOPY (EGD) WITH PROPOFOL (N/A)  Patient location during evaluation: PACU Anesthesia Type: General Level of consciousness: awake Pain management: pain level controlled Vital Signs Assessment: post-procedure vital signs reviewed and stable Respiratory status: spontaneous breathing Cardiovascular status: stable Anesthetic complications: no     Last Vitals:  Vitals:   04/04/17 1346 04/04/17 1406  BP: (!) 105/42 98/86  Pulse: 80   Resp: 14   Temp: (!) 35.6 C     Last Pain:  Vitals:   04/04/17 1346  TempSrc: Tympanic                 VAN STAVEREN,Ondre Salvetti

## 2017-04-04 NOTE — Anesthesia Procedure Notes (Addendum)
Date/Time: 04/04/2017 1:08 PM Performed by: Darlyne Russian Pre-anesthesia Checklist: Patient identified, Emergency Drugs available, Suction available, Patient being monitored and Timeout performed Patient Re-evaluated:Patient Re-evaluated prior to induction Oxygen Delivery Method: Nasal cannula Placement Confirmation: positive ETCO2

## 2017-04-04 NOTE — H&P (Signed)
Outpatient short stay form Pre-procedure 04/04/2017 1:05 PM Lollie Sails MD  Primary Physician: Dr. Einar Pheasant  Reason for visit:  EGD  History of present illness:  Patient is a 83 year old female presenting today as above. She has a history of dysphagia goes back several years. It is of note that she has had dilatation in the past. She did have a room swallow done on 02/09/2017 showing a marked esophageal spasm in the distal half of the esophagus throughout the examination. It did not however delay transit of the barium or the barium tablet. There is some mild esophageal reflux however there is also a large hiatal hernia that was transient in nature in the supine position. Patient does catch foods at times they seem to go down slowly, however she does not regurgitate foods. She does take 81 mg aspirin daily but has held that   Current Facility-Administered Medications:  .  0.9 %  sodium chloride infusion, , Intravenous, Continuous, Lollie Sails, MD, Last Rate: 20 mL/hr at 04/04/17 1223 .  0.9 %  sodium chloride infusion, , Intravenous, Continuous, Lollie Sails, MD  Prescriptions Prior to Admission  Medication Sig Dispense Refill Last Dose  . amitriptyline (ELAVIL) 25 MG tablet Take 25 mg by mouth at bedtime.   04/03/2017 at 2100  . amLODipine (NORVASC) 10 MG tablet TAKE 1 TABLET DAILY 90 tablet 1 04/03/2017 at 0800  . atorvastatin (LIPITOR) 40 MG tablet Take 1 tablet (40 mg total) by mouth daily. 90 tablet 3 04/03/2017 at 2100  . Calcium Carbonate (CALCIUM 600 PO) Take 1 tablet by mouth daily.   04/03/2017 at 0800  . estrogens, conjugated, (PREMARIN) 0.9 MG tablet Take 0.9 mg by mouth daily.    04/03/2017 at 0800  . levothyroxine (SYNTHROID) 50 MCG tablet Take 1 tablet (50 mcg total) by mouth daily before breakfast. 90 tablet 3 04/03/2017 at 0800  . metoprolol succinate (TOPROL-XL) 50 MG 24 hr tablet TAKE 1 TABLET TWICE A DAY  WITH OR IMMEDIATELY        FOLLOWING A MEAL 180  tablet 1 04/03/2017 at 2100  . pantoprazole (PROTONIX) 40 MG tablet TAKE 1 TABLET DAILY 90 tablet 3 04/03/2017 at 0800  . Vitamin D, Cholecalciferol, 1000 units CAPS Take 1 capsule by mouth daily.   04/03/2017 at 0800  . aspirin 81 MG tablet Take 81 mg by mouth daily.   04/02/2017  . ciclopirox (PENLAC) 8 % solution Apply topically at bedtime. Apply over nail and surrounding skin. Apply daily over previous coat. After seven (7) days, may remove with alcohol and continue cycle. 6.6 mL 0   . naproxen sodium (ANAPROX) 220 MG tablet Take 220 mg by mouth daily as needed.   Not Taking at Unknown time     Allergies  Allergen Reactions  . Ace Inhibitors Other (See Comments)    Questionable angioedema  . Prinivil [Lisinopril] Swelling    Face, neck and tongue swelled up  . Contrast Media [Iodinated Diagnostic Agents]     Oral and iv dye   . Flaxseed [Linseed Oil] Other (See Comments)    Muscle pain   . Floxin [Ofloxacin] Other (See Comments)    Ached all over  . Mercury Swelling  . Sulfa Antibiotics Rash and Other (See Comments)    Queasy   . Vioxx [Rofecoxib] Diarrhea     Past Medical History:  Diagnosis Date  . Benign esophageal stricture   . Cancer of left breast, stage 0 .0  .  Complication of anesthesia   . Diverticulosis   . Diverticulosis   . Dysphagia   . Family history of adverse reaction to anesthesia    brother had a hard time waking up with hernia surgery as a child.  Marland Kitchen GERD (gastroesophageal reflux disease)   . Headache(784.0)    h/o recurrent vascular headaches. no longer gets these  . Hypercholesterolemia   . Hypertension   . Hypothyroidism   . IBS (irritable bowel syndrome)   . Leg swelling   . Neck fullness 2016   dr. Nicki Reaper reviewed and found it to be nothing.  prinivil seemed to cause face and neck and tongue swelling  . PONV (postoperative nausea and vomiting)   . Urinary tract bacterial infections     Review of systems:      Physical Exam    Heart and  lungs: Regular rate and rhythm without rub or gallop, lungs are bilaterally clear.    HEENT: Normocephalic atraumatic eyes are anicteric    Other:     Pertinant exam for procedure: Soft nontender nondistended bowel sounds positive normoactive.    Planned proceedures: EGD and indicated procedures. I have discussed the risks benefits and complications of procedures to include not limited to bleeding, infection, perforation and the risk of sedation and the patient wishes to proceed.    Lollie Sails, MD Gastroenterology 04/04/2017  1:05 PM

## 2017-04-04 NOTE — Op Note (Signed)
[facility] Gastroenterology Patient Name: Elizabeth Henderson Procedure Date: 04/04/2017 1:08 PM MRN: [id number] Account #: [id number] Date of Birth: July 06, 1932 Admit Type: Outpatient Age: 83 Room: [facility] ENDO ROOM 1 Gender: Female Note Status: Finalized Procedure:            Upper GI endoscopy Indications:          Dysphagia Providers:            Lollie Sails, MD Referring MD:         Einar Pheasant, MD (Referring MD) Medicines:            Monitored Anesthesia Care Complications:        No immediate complications. Procedure:            Pre-Anesthesia Assessment:                       - ASA Grade Assessment: III - A patient with severe                        systemic disease.                       After obtaining informed consent, the endoscope was                        passed under direct vision. Throughout the procedure,                        the patient's blood pressure, pulse, and oxygen                        saturations were monitored continuously. The Endoscope                        was introduced through the mouth, and advanced to the                        third part of duodenum. The upper GI endoscopy was                        unusually difficult due to unusual anatomy. The patient                        tolerated the procedure well. Findings:      the lumen of the esophagus is very tortuous beginning at about 30 cm       from the incisors. There is no noted stenosis or stricture and no       inflamation noted.      A large hiatal hernia was found. The Z-line was a variable distance from       incisors; the hiatal hernia was sliding. The hiatal hernia is moderate       on introduction of the scope, but on egress it is noted to be larger.       There are some associated cameron type erosions at the diaphragmatic       hiatus. The gastric vault was otherwise normal with no evidence of       distal gastritis.      The examined duodenum was normal.    The scope was retrieved to about the mid esophageal region and despite  several careful efforts I was unable to reintroduce into the stomach.       The distal esophagus is fairly lax in appearance and very tortuous. I       did not notice spasm.      The cardia and gastric fundus were normal on retroflexion otherwise. Impression:           - Large hiatal hernia.                       - Normal examined duodenum.                       - No specimens collected. Recommendation:       - Discharge patient to home.                       - Perform a CT scan (computed tomography) of chest with                        contrast and abdomen with contrast at appointment to be                        scheduled.                       - I consider patient to be at risk for possible                        volvulus, surgical consult to be obtained. Procedure Code(s):    --- Professional ---                       [phone], Esophagogastroduodenoscopy, flexible, transoral;                        diagnostic, including collection of specimen(s) by                        brushing or washing, when performed (separate procedure) Diagnosis Code(s):    --- Professional ---                       K44.9, Diaphragmatic hernia without obstruction or                        gangrene                       R13.10, Dysphagia, unspecified CPT copyright 2016 [employer]. All rights reserved. The codes documented in this report are preliminary and upon coder review may  be revised to meet current compliance requirements. Lollie Sails, MD 04/04/2017 1:58:35 PM This report has been signed electronically. Number of Addenda: 0 Note Initiated On: 04/04/2017 1:08 PM      [facility]

## 2017-04-04 NOTE — Transfer of Care (Signed)
Immediate Anesthesia Transfer of Care Note  Patient: Elizabeth Henderson  Procedure(s) Performed: Procedure(s): ESOPHAGOGASTRODUODENOSCOPY (EGD) WITH PROPOFOL (N/A)  Patient Location: PACU  Anesthesia Type:General  Level of Consciousness: awake, alert  and oriented  Airway & Oxygen Therapy: Patient Spontanous Breathing and Patient connected to nasal cannula oxygen  Post-op Assessment: Report given to RN and Post -op Vital signs reviewed and stable  Post vital signs: Reviewed and stable  Last Vitals:  Vitals:   04/04/17 1205 04/04/17 1346  BP: (!) 149/69 (!) 105/42  Pulse: 74 80  Resp: 14 14  Temp: 36.6 C (!) 35.6 C    Last Pain:  Vitals:   04/04/17 1346  TempSrc: Tympanic         Complications: No apparent anesthesia complications

## 2017-04-05 ENCOUNTER — Encounter: Payer: Self-pay | Admitting: Gastroenterology

## 2017-04-11 ENCOUNTER — Other Ambulatory Visit: Payer: Self-pay | Admitting: Gastroenterology

## 2017-04-11 DIAGNOSIS — K449 Diaphragmatic hernia without obstruction or gangrene: Secondary | ICD-10-CM

## 2017-04-11 DIAGNOSIS — R1084 Generalized abdominal pain: Secondary | ICD-10-CM

## 2017-04-19 ENCOUNTER — Ambulatory Visit
Admission: RE | Admit: 2017-04-19 | Discharge: 2017-04-19 | Disposition: A | Discharge status: Home / Self Care | Payer: Medicare Other | Source: Ambulatory Visit | Attending: Gastroenterology | Admitting: Gastroenterology

## 2017-04-19 DIAGNOSIS — K5730 Diverticulosis of large intestine without perforation or abscess without bleeding: Secondary | ICD-10-CM | POA: Diagnosis not present

## 2017-04-19 DIAGNOSIS — R1084 Generalized abdominal pain: Secondary | ICD-10-CM

## 2017-04-19 DIAGNOSIS — K449 Diaphragmatic hernia without obstruction or gangrene: Secondary | ICD-10-CM | POA: Insufficient documentation

## 2017-04-19 LAB — POCT I-STAT CREATININE: Creatinine, Ser: 1.2 mg/dL — ABNORMAL HIGH (ref 0.44–1.00)

## 2017-04-19 MED ORDER — IOPAMIDOL (ISOVUE-300) INJECTION 61%
75.0000 mL | Freq: Once | INTRAVENOUS | Status: DC | PRN
Start: 2017-04-19 — End: 2017-04-20

## 2017-04-22 ENCOUNTER — Other Ambulatory Visit: Payer: Self-pay | Admitting: Internal Medicine

## 2017-05-24 DIAGNOSIS — K449 Diaphragmatic hernia without obstruction or gangrene: Secondary | ICD-10-CM | POA: Diagnosis not present

## 2017-05-30 ENCOUNTER — Other Ambulatory Visit (INDEPENDENT_AMBULATORY_CARE_PROVIDER_SITE_OTHER): Payer: Medicare Other

## 2017-05-30 DIAGNOSIS — E7800 Pure hypercholesterolemia, unspecified: Secondary | ICD-10-CM | POA: Diagnosis not present

## 2017-05-30 DIAGNOSIS — I10 Essential (primary) hypertension: Secondary | ICD-10-CM | POA: Diagnosis not present

## 2017-05-30 DIAGNOSIS — R739 Hyperglycemia, unspecified: Secondary | ICD-10-CM

## 2017-05-30 LAB — BASIC METABOLIC PANEL
BUN: 15 mg/dL (ref 6–23)
CALCIUM: 9.4 mg/dL (ref 8.4–10.5)
CO2: 25 mEq/L (ref 19–32)
CREATININE: 1.13 mg/dL (ref 0.40–1.20)
Chloride: 102 mEq/L (ref 96–112)
GFR: 48.89 mL/min — AB (ref >60.00)
Glucose, Bld: 99 mg/dL (ref 70–99)
Potassium: 4.4 mEq/L (ref 3.5–5.1)
SODIUM: 137 meq/L (ref 135–145)

## 2017-05-30 LAB — HEPATIC FUNCTION PANEL
ALBUMIN: 3.8 g/dL (ref 3.5–5.2)
ALT: 11 U/L (ref 0–35)
AST: 18 U/L (ref 0–37)
Alkaline Phosphatase: 76 U/L (ref 39–117)
BILIRUBIN TOTAL: 0.5 mg/dL (ref 0.2–1.2)
Bilirubin, Direct: 0.1 mg/dL (ref 0.0–0.3)
Total Protein: 7.1 g/dL (ref 6.0–8.3)

## 2017-05-30 LAB — LIPID PANEL
CHOLESTEROL: 149 mg/dL (ref 0–200)
HDL: 69.6 mg/dL (ref >39.00)
LDL CALC: 44 mg/dL (ref 0–99)
NonHDL: 79.62
TRIGLYCERIDES: 176 mg/dL — AB (ref 0.0–149.0)
Total CHOL/HDL Ratio: 2
VLDL: 35.2 mg/dL (ref 0.0–40.0)

## 2017-05-30 LAB — HEMOGLOBIN A1C: HEMOGLOBIN A1C: 5.5 % (ref 4.6–6.5)

## 2017-05-31 ENCOUNTER — Ambulatory Visit (INDEPENDENT_AMBULATORY_CARE_PROVIDER_SITE_OTHER): Payer: Medicare Other | Admitting: Internal Medicine

## 2017-05-31 ENCOUNTER — Encounter: Payer: Self-pay | Admitting: Internal Medicine

## 2017-05-31 VITALS — BP 136/68 | HR 76 | Temp 97.6°F | Resp 17 | Ht 61.81 in | Wt 154.8 lb

## 2017-05-31 DIAGNOSIS — E7800 Pure hypercholesterolemia, unspecified: Secondary | ICD-10-CM | POA: Diagnosis not present

## 2017-05-31 DIAGNOSIS — E041 Nontoxic single thyroid nodule: Secondary | ICD-10-CM | POA: Diagnosis not present

## 2017-05-31 DIAGNOSIS — R739 Hyperglycemia, unspecified: Secondary | ICD-10-CM

## 2017-05-31 DIAGNOSIS — M7989 Other specified soft tissue disorders: Secondary | ICD-10-CM | POA: Diagnosis not present

## 2017-05-31 DIAGNOSIS — K589 Irritable bowel syndrome without diarrhea: Secondary | ICD-10-CM

## 2017-05-31 DIAGNOSIS — E039 Hypothyroidism, unspecified: Secondary | ICD-10-CM

## 2017-05-31 DIAGNOSIS — I10 Essential (primary) hypertension: Secondary | ICD-10-CM

## 2017-05-31 DIAGNOSIS — Z23 Encounter for immunization: Secondary | ICD-10-CM

## 2017-05-31 DIAGNOSIS — R0989 Other specified symptoms and signs involving the circulatory and respiratory systems: Secondary | ICD-10-CM | POA: Diagnosis not present

## 2017-05-31 DIAGNOSIS — R1310 Dysphagia, unspecified: Secondary | ICD-10-CM

## 2017-05-31 MED ORDER — TETANUS-DIPHTH-ACELL PERTUSSIS 5-2.5-18.5 LF-MCG/0.5 IM SUSP: 0.5000 mL | Freq: Once | INTRAMUSCULAR | 0 refills | Status: AC

## 2017-05-31 NOTE — Progress Notes (Signed)
[name], female   DOB: [birth year], 83 y.o.   MRN: [id number]   Subjective:    [name], female    DOB: 23-Apr-1932, 83 y.o.   MRN: [id number]  HPI  Patient here for a scheduled follow up.  She was recently evaluated for a large hiatal hernia.  Saw surgery.  Elected to hold on surgery.  She is swallowing ok.  Has to eat slowly and take small bites.  Wants to hold on any further intervention.  No chest pain.  No sob.  No abdominal pain.  Bowels moving.  Discussed labs.     Past Medical History:  Diagnosis Date  . Benign esophageal stricture   . Complication of anesthesia   . Diverticulosis   . Diverticulosis   . Dysphagia   . Family history of adverse reaction to anesthesia    brother had a hard time waking up with hernia surgery as a child.  Marland Kitchen GERD (gastroesophageal reflux disease)   . Headache(784.0)    h/o recurrent vascular headaches. no longer gets these  . Hypercholesterolemia   . Hypertension   . Hypothyroidism   . IBS (irritable bowel syndrome)   . Leg swelling   . Neck fullness 2016   dr. Nicki Reaper reviewed and found it to be nothing.  prinivil seemed to cause face and neck and tongue swelling  . PONV (postoperative nausea and vomiting)   . Urinary tract bacterial infections    Past Surgical History:  Procedure Laterality Date  . ABDOMINAL HYSTERECTOMY    . APPENDECTOMY    . CHOLECYSTECTOMY  1990  . CYSTOSCOPY     x2  . ESOPHAGOGASTRODUODENOSCOPY (EGD) WITH PROPOFOL N/A 08/28/2015   Procedure: ESOPHAGOGASTRODUODENOSCOPY (EGD) WITH PROPOFOL;  Surgeon: Hulen Luster, MD;  Location: [facility] ENDOSCOPY;  Service: Gastroenterology;  Laterality: N/A;  . ESOPHAGOGASTRODUODENOSCOPY (EGD) WITH PROPOFOL N/A 04/04/2017   Procedure: ESOPHAGOGASTRODUODENOSCOPY (EGD) WITH PROPOFOL;  Surgeon: Lollie Sails, MD;  Location: [facility] ENDOSCOPY;  Service: Endoscopy;  Laterality: N/A;  . PARTIAL HYSTERECTOMY    . PARTIAL KNEE ARTHROPLASTY Left 04/15/2016   Procedure: UNICOMPARTMENTAL KNEE;  Surgeon: Corky Mull, MD;  Location: [facility] ORS;  Service: Orthopedics;  Laterality: Left;  . TONSILLECTOMY     Family History  Problem Relation Age of Onset  . Heart disease Father   . Heart disease Mother   . Heart disease Brother   . Heart disease Brother   . Breast cancer Neg Hx   . Colon cancer Neg Hx    Social History   Social History  . Marital status: Married    Spouse name: N/A  . Number of children: 2  . Years of education: N/A   Occupational History  . retired    Social History Main Topics  . Smoking status: Never Smoker  . Smokeless tobacco: Never Used  . Alcohol use No     Comment: seldom, a glass of wine 1-3 per year.  . Drug use: No  . Sexual activity: Not Currently   Other Topics Concern  . None   Social History Narrative  . None    Outpatient Encounter Prescriptions as of 05/31/2017  Medication Sig  . amitriptyline (ELAVIL) 25 MG tablet Take 25 mg by mouth at bedtime.  Marland Kitchen amLODipine (NORVASC) 10 MG tablet TAKE 1 TABLET DAILY  . aspirin 81 MG tablet Take 81 mg by mouth daily.  Marland Kitchen atorvastatin (LIPITOR) 40 MG tablet Take 1 tablet (40 mg total) by  mouth daily.  . Cholecalciferol (VITAMIN D3) 1000 units CAPS Take by mouth.  . ciclopirox (PENLAC) 8 % solution Apply topically at bedtime. Apply over nail and surrounding skin. Apply daily over previous coat. After seven (7) days, may remove with alcohol and continue cycle.  . estrogens, conjugated, (PREMARIN) 0.9 MG tablet Take 0.9 mg by mouth daily.   Marland Kitchen levothyroxine (SYNTHROID) 50 MCG tablet Take 1 tablet (50 mcg total) by mouth daily before breakfast.  . metoprolol succinate (TOPROL-XL) 50 MG 24 hr tablet TAKE 1 TABLET TWICE A DAY  WITH OR IMMEDIATELY        FOLLOWING A MEAL  . pantoprazole (PROTONIX) 40 MG tablet TAKE 1 TABLET DAILY  . [DISCONTINUED] naproxen sodium (ANAPROX) 220 MG tablet Take 220 mg by mouth daily as needed.  . [DISCONTINUED] Vitamin D, Cholecalciferol,  1000 units CAPS Take 1 capsule by mouth daily.  . [EXPIRED] Tdap (BOOSTRIX) 5-2.5-18.5 LF-MCG/0.5 injection Inject 0.5 mLs into the muscle once.  . [DISCONTINUED] Calcium Carbonate (CALCIUM 600 PO) Take 1 tablet by mouth daily.   No facility-administered encounter medications on file as of 05/31/2017.     Review of Systems  Constitutional: Negative for appetite change and unexpected weight change.  HENT: Negative for congestion and sinus pressure.   Respiratory: Negative for cough, chest tightness and shortness of breath.   Cardiovascular: Negative for chest pain and palpitations.  Gastrointestinal: Negative for abdominal pain, diarrhea, nausea and vomiting.  Genitourinary: Negative for difficulty urinating and dysuria.  Musculoskeletal: Negative for joint swelling and myalgias.  Skin: Negative for color change and rash.  Neurological: Negative for dizziness, light-headedness and headaches.  Psychiatric/Behavioral: Negative for agitation and dysphoric mood.       Objective:     Blood pressure rechecked by me:  136/68  Physical Exam  Constitutional: She appears well-developed and well-nourished. No distress.  HENT:  Nose: Nose normal.  Mouth/Throat: Oropharynx is clear and moist.  Neck: Neck supple. No thyromegaly present.  Cardiovascular: Normal rate and regular rhythm.   Pulmonary/Chest: Breath sounds normal. No respiratory distress. She has no wheezes.  Abdominal: Soft. Bowel sounds are normal. There is no tenderness.  Musculoskeletal: She exhibits no tenderness.  No increased swelling.    Lymphadenopathy:    She has no cervical adenopathy.  Skin: No rash noted. No erythema.  Psychiatric: She has a normal mood and affect. Her behavior is normal.    BP 136/68   Pulse 76   Temp 97.6 F (36.4 C) (Oral)   Resp 17   Ht 5' 1.81" (1.57 m)   Wt 154 lb 12.8 oz (70.2 kg)   SpO2 95%   BMI 28.49 kg/m  Wt Readings from Last 3 Encounters:  05/31/17 154 lb 12.8 oz (70.2 kg)    04/04/17 153 lb (69.4 kg)  01/28/17 153 lb 9.6 oz (69.7 kg)     Lab Results  Component Value Date   WBC 7.6 01/25/2017   HGB 13.4 01/25/2017   HCT 39.8 01/25/2017   PLT 217.0 01/25/2017   GLUCOSE 99 05/30/2017   CHOL 149 05/30/2017   TRIG 176.0 (H) 05/30/2017   HDL 69.60 05/30/2017   LDLDIRECT 64.9 12/08/2012   LDLCALC 44 05/30/2017   ALT 11 05/30/2017   AST 18 05/30/2017   NA 137 05/30/2017   K 4.4 05/30/2017   CL 102 05/30/2017   CREATININE 1.13 05/30/2017   BUN 15 05/30/2017   CO2 25 05/30/2017   TSH 3.12 01/25/2017   INR 1.03 04/07/2016  HGBA1C 5.5 05/30/2017    Ct Abdomen Pelvis W Contrast  Result Date: 04/19/2017 CLINICAL DATA:  Generalized abdominal pain. EXAM: CT ABDOMEN AND PELVIS WITH CONTRAST TECHNIQUE: Multidetector CT imaging of the abdomen and pelvis was performed using the standard protocol following bolus administration of intravenous contrast. CONTRAST:  75 cc Isovue-300 COMPARISON:  Lumbar spine radiographs dated 01/28/2017 FINDINGS: Lower chest: Small amount of linear atelectasis or scarring at both lung bases. Large hiatal hernia. This has a paraesophageal component. Hepatobiliary: Cholecystectomy clips.  Small right lobe liver cyst. Pancreas: Unremarkable. No pancreatic ductal dilatation or surrounding inflammatory changes. Spleen: Normal appearing spleen and small accessory splenule. Adrenals/Urinary Tract: Small upper pole right renal cyst. Normal appearing left kidney, ureters and urinary bladder. Normal appearing adrenal glands. Stomach/Bowel: Large hiatal hernia with a paraesophageal component. Multiple colonic diverticula without evidence of diverticulitis. Surgically absent appendix. Vascular/Lymphatic: Atheromatous arterial calcifications without aneurysm. No enlarged lymph nodes. Reproductive: Surgically absent uterus.  Unremarkable ovaries. Other: No abdominal wall hernia or abnormality. No abdominopelvic ascites. Musculoskeletal: Lumbar and lower  thoracic spine degenerative changes. IMPRESSION: 1. No acute abnormality. 2. Large hiatal hernia with a paraesophageal component. 3. Colonic diverticulosis. Electronically Signed   By: Claudie Revering M.D.   On: 04/19/2017 19:58       Assessment & Plan:   Problem List Items Addressed This Visit    Dysphagia    Has seen GI.  S/p previous dilatation.  On protonix.  CT revealed large hiatal hernia.  Saw surgery.  Not a surgical candidate at this point.  Follow.        Hypercholesterolemia    On lipitor.  Low cholesterol diet and exercise.  Follow lipid panel and liver function tests.        Hyperglycemia    Low carb diet and exercise.  Follow met b and a1c.        Hypertension    Blood pressure on recheck improved.  Follow pressures.  Continue same medication regimen.  Follow metabolic panel.       Hypothyroidism    On thyroid replacement.  Follow tsh.        IBS (irritable bowel syndrome)    Bowels stable.        Leg swelling    Continue compression hose.        Right carotid bruit    Had f/u carotid ultrasound 12/12/15.  No significant change.        Thyroid nodule    Thyroid ultrasound previously revealed 6 mm nodule.  Needs f/u thyroid ultrasound.  Need to schedule.         Other Visit Diagnoses    Need for Tdap vaccination    -  Primary   Encounter for immunization       Relevant Orders   Flu vaccine HIGH DOSE PF (Completed)       Einar Pheasant, MD

## 2017-06-03 ENCOUNTER — Encounter: Payer: Self-pay | Admitting: Internal Medicine

## 2017-06-03 NOTE — Assessment & Plan Note (Signed)
Blood pressure on recheck improved.  Follow pressures.  Continue same medication regimen.  Follow metabolic panel.

## 2017-06-03 NOTE — Assessment & Plan Note (Signed)
Had f/u carotid ultrasound 12/12/15.  No significant change.

## 2017-06-03 NOTE — Assessment & Plan Note (Signed)
On thyroid replacement.  Follow tsh.  

## 2017-06-03 NOTE — Assessment & Plan Note (Signed)
Has seen GI.  S/p previous dilatation.  On protonix.  CT revealed large hiatal hernia.  Saw surgery.  Not a surgical candidate at this point.  Follow.

## 2017-06-03 NOTE — Assessment & Plan Note (Signed)
Thyroid ultrasound previously revealed 6 mm nodule.  Needs f/u thyroid ultrasound.  Need to schedule.

## 2017-06-03 NOTE — Assessment & Plan Note (Signed)
Continue compression hose.   

## 2017-06-03 NOTE — Assessment & Plan Note (Signed)
Low carb diet and exercise.  Follow met b and a1c.   

## 2017-06-03 NOTE — Assessment & Plan Note (Signed)
Bowels stable.  

## 2017-06-03 NOTE — Assessment & Plan Note (Signed)
On lipitor.  Low cholesterol diet and exercise.  Follow lipid panel and liver function tests.   

## 2017-06-08 ENCOUNTER — Other Ambulatory Visit: Payer: Self-pay | Admitting: Internal Medicine

## 2017-06-08 DIAGNOSIS — E041 Nontoxic single thyroid nodule: Secondary | ICD-10-CM

## 2017-06-08 NOTE — Progress Notes (Signed)
Order placed for thyroid ultrasound. 

## 2017-06-22 ENCOUNTER — Ambulatory Visit
Admission: RE | Admit: 2017-06-22 | Discharge: 2017-06-22 | Disposition: A | Discharge status: Home / Self Care | Payer: Medicare Other | Source: Ambulatory Visit | Attending: Internal Medicine | Admitting: Internal Medicine

## 2017-06-22 DIAGNOSIS — E041 Nontoxic single thyroid nodule: Secondary | ICD-10-CM | POA: Diagnosis not present

## 2017-07-27 ENCOUNTER — Other Ambulatory Visit: Payer: Self-pay | Admitting: Internal Medicine

## 2017-08-17 DIAGNOSIS — H2513 Age-related nuclear cataract, bilateral: Secondary | ICD-10-CM | POA: Diagnosis not present

## 2017-08-23 IMAGING — DX DG KNEE 1-2V PORT*L*
2 series · 2 of 2 positions shown · non-contrast
Comparison: MRI 03/18/2016

CLINICAL DATA: Status post partial knee replacement

EXAM:
PORTABLE LEFT KNEE - 1-2 VIEW

[knee ap]
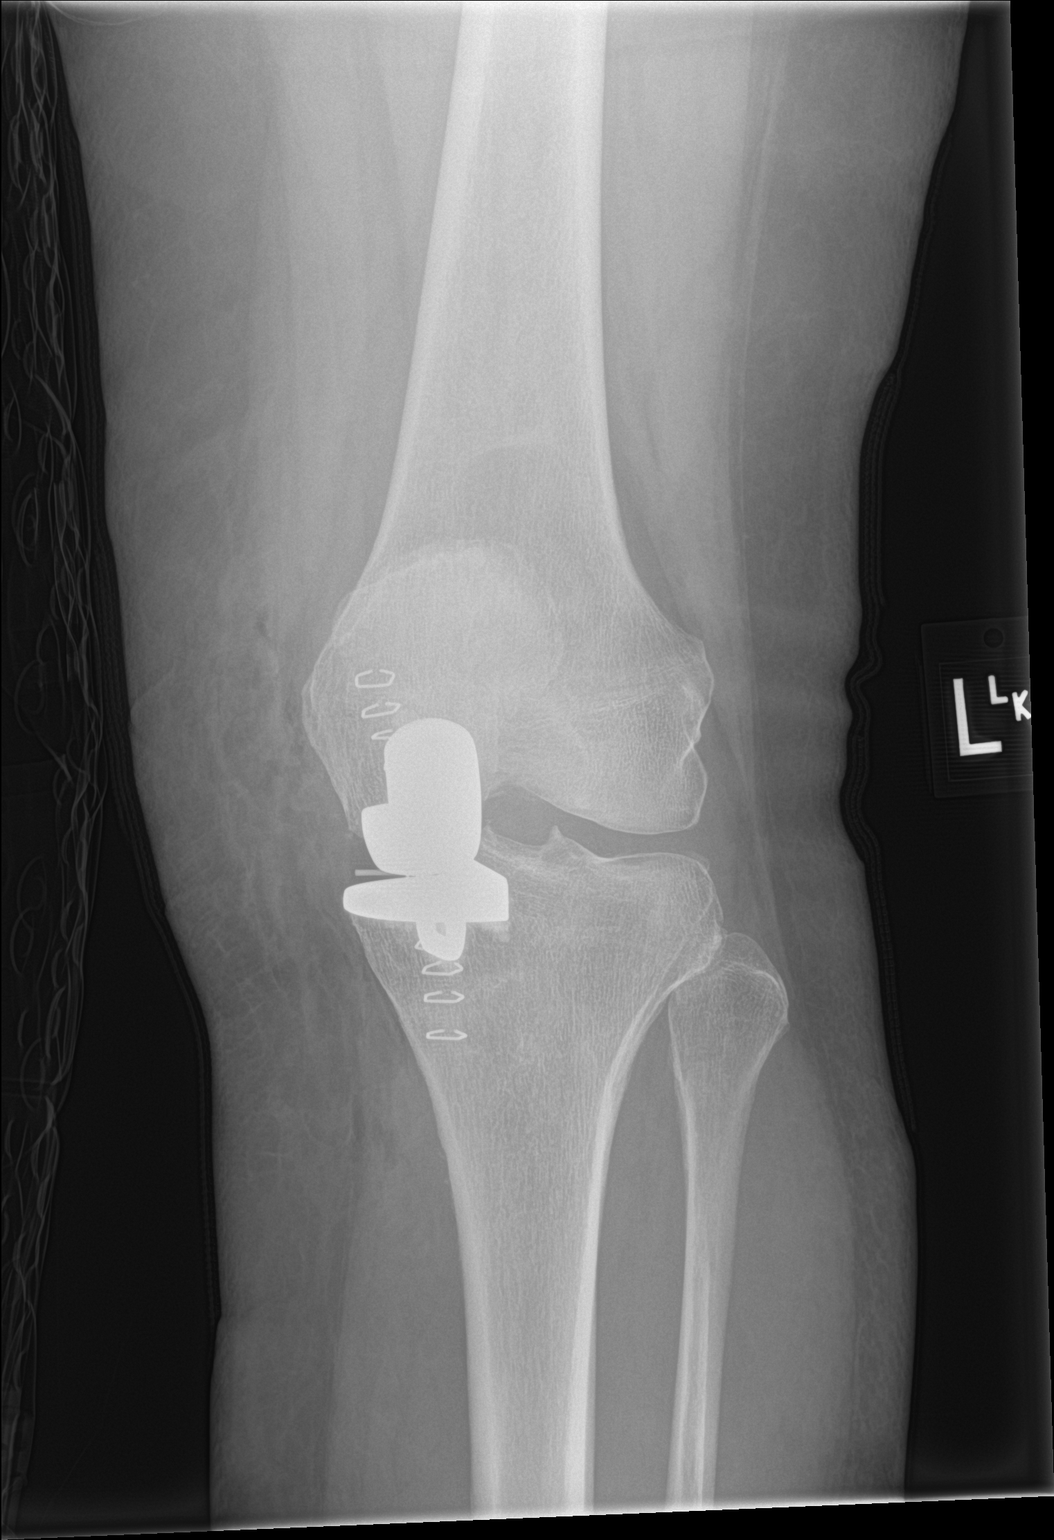

[knee lat]
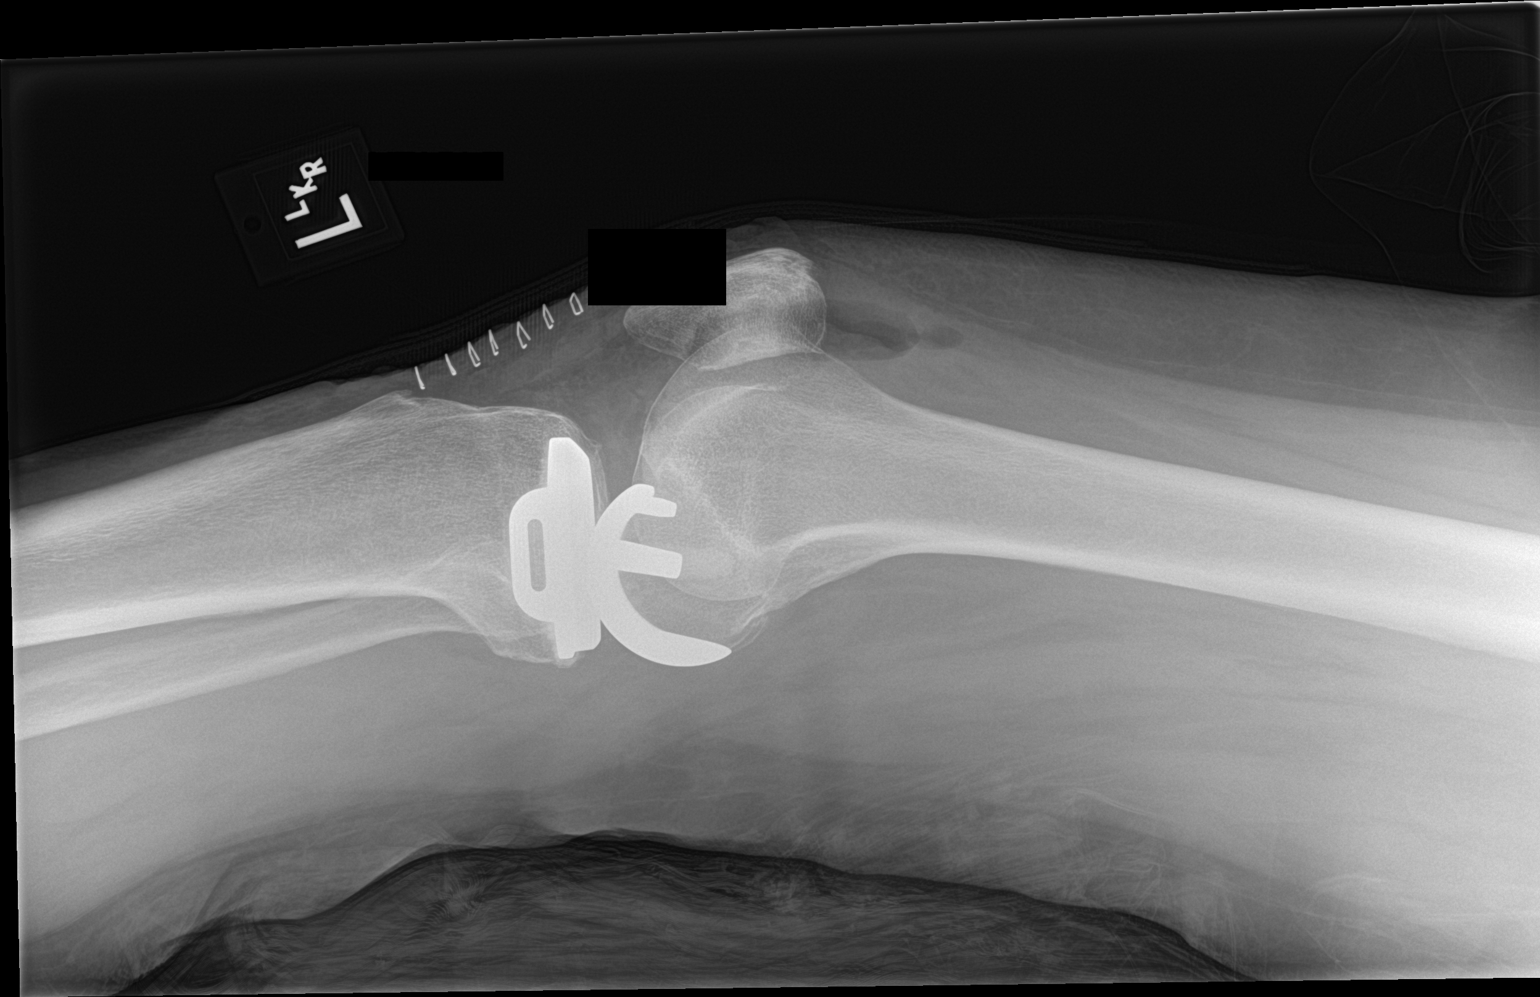

[2 of 2 positions shown; findings below may reference images not displayed]

FINDINGS: Patient has undergone medial unicondylar knee arthroplasty. Clips
overlie the anterior aspect of the knee. Small amount of gas
identified within the joint space. There is no acute fracture or
subluxation.
IMPRESSION: No adverse features identified following the intercondylar knee
arthroplasty.

## 2017-09-07 DIAGNOSIS — R208 Other disturbances of skin sensation: Secondary | ICD-10-CM | POA: Diagnosis not present

## 2017-09-07 DIAGNOSIS — D485 Neoplasm of uncertain behavior of skin: Secondary | ICD-10-CM | POA: Diagnosis not present

## 2017-09-07 DIAGNOSIS — B079 Viral wart, unspecified: Secondary | ICD-10-CM | POA: Diagnosis not present

## 2017-09-07 DIAGNOSIS — L538 Other specified erythematous conditions: Secondary | ICD-10-CM | POA: Diagnosis not present

## 2017-09-07 DIAGNOSIS — L820 Inflamed seborrheic keratosis: Secondary | ICD-10-CM | POA: Diagnosis not present

## 2017-09-07 DIAGNOSIS — L720 Epidermal cyst: Secondary | ICD-10-CM | POA: Diagnosis not present

## 2017-09-15 DIAGNOSIS — L720 Epidermal cyst: Secondary | ICD-10-CM | POA: Diagnosis not present

## 2017-09-28 DIAGNOSIS — H2511 Age-related nuclear cataract, right eye: Secondary | ICD-10-CM | POA: Diagnosis not present

## 2017-10-03 ENCOUNTER — Other Ambulatory Visit: Payer: Self-pay

## 2017-10-03 ENCOUNTER — Encounter: Payer: Self-pay | Admitting: *Deleted

## 2017-10-03 ENCOUNTER — Other Ambulatory Visit (INDEPENDENT_AMBULATORY_CARE_PROVIDER_SITE_OTHER): Payer: Medicare Other

## 2017-10-03 DIAGNOSIS — R739 Hyperglycemia, unspecified: Secondary | ICD-10-CM | POA: Diagnosis not present

## 2017-10-03 DIAGNOSIS — E7800 Pure hypercholesterolemia, unspecified: Secondary | ICD-10-CM

## 2017-10-03 DIAGNOSIS — I10 Essential (primary) hypertension: Secondary | ICD-10-CM | POA: Diagnosis not present

## 2017-10-03 LAB — HEPATIC FUNCTION PANEL
ALT: 10 U/L (ref 0–35)
AST: 15 U/L (ref 0–37)
Albumin: 3.7 g/dL (ref 3.5–5.2)
Alkaline Phosphatase: 78 U/L (ref 39–117)
BILIRUBIN DIRECT: 0.1 mg/dL (ref 0.0–0.3)
BILIRUBIN TOTAL: 0.5 mg/dL (ref 0.2–1.2)
Total Protein: 7.1 g/dL (ref 6.0–8.3)

## 2017-10-03 LAB — BASIC METABOLIC PANEL
BUN: 19 mg/dL (ref 6–23)
CALCIUM: 9.1 mg/dL (ref 8.4–10.5)
CHLORIDE: 102 meq/L (ref 96–112)
CO2: 25 mEq/L (ref 19–32)
Creatinine, Ser: 1.16 mg/dL (ref 0.40–1.20)
GFR: 47.4 mL/min — AB (ref >60.00)
Glucose, Bld: 104 mg/dL — ABNORMAL HIGH (ref 70–99)
Potassium: 4.4 mEq/L (ref 3.5–5.1)
Sodium: 136 mEq/L (ref 135–145)

## 2017-10-03 LAB — LIPID PANEL
Cholesterol: 141 mg/dL (ref 0–200)
HDL: 68.3 mg/dL (ref >39.00)
LDL CALC: 37 mg/dL (ref 0–99)
NonHDL: 72.7
TRIGLYCERIDES: 179 mg/dL — AB (ref 0.0–149.0)
Total CHOL/HDL Ratio: 2
VLDL: 35.8 mg/dL (ref 0.0–40.0)

## 2017-10-03 LAB — HEMOGLOBIN A1C: Hgb A1c MFr Bld: 5.5 % (ref 4.6–6.5)

## 2017-10-04 ENCOUNTER — Ambulatory Visit (INDEPENDENT_AMBULATORY_CARE_PROVIDER_SITE_OTHER): Payer: Medicare Other | Admitting: Internal Medicine

## 2017-10-04 DIAGNOSIS — R739 Hyperglycemia, unspecified: Secondary | ICD-10-CM | POA: Diagnosis not present

## 2017-10-04 DIAGNOSIS — K219 Gastro-esophageal reflux disease without esophagitis: Secondary | ICD-10-CM | POA: Diagnosis not present

## 2017-10-04 DIAGNOSIS — I10 Essential (primary) hypertension: Secondary | ICD-10-CM | POA: Diagnosis not present

## 2017-10-04 DIAGNOSIS — E7800 Pure hypercholesterolemia, unspecified: Secondary | ICD-10-CM

## 2017-10-04 DIAGNOSIS — M7989 Other specified soft tissue disorders: Secondary | ICD-10-CM | POA: Diagnosis not present

## 2017-10-04 DIAGNOSIS — E039 Hypothyroidism, unspecified: Secondary | ICD-10-CM | POA: Diagnosis not present

## 2017-10-04 DIAGNOSIS — E041 Nontoxic single thyroid nodule: Secondary | ICD-10-CM | POA: Diagnosis not present

## 2017-10-04 NOTE — Patient Instructions (Signed)
Examples of probiotics:  Align, culturelle or florastor 

## 2017-10-04 NOTE — Progress Notes (Signed)
[name], female   DOB: 01/25/1932, 84 y.o.   MRN: [id number]   Subjective:    [name], female    DOB: 11-27-1931, 84 y.o.   MRN: [id number]  HPI  Patient here for a scheduled follow up.  She reports she is doing relatively well.  Tries to stay active.  No chest pain.  No sob. No acid reflux.  No abdominal pain.  Bowels moving.  No urine change.  Blood pressure on outside checks averaging 130/60s.  Overall she feels she is doing well.     Past Medical History:  Diagnosis Date  . Arthritis    lower back, hands  . Benign esophageal stricture   . Complication of anesthesia   . Diverticulosis   . Diverticulosis   . Dysphagia   . Family history of adverse reaction to anesthesia    brother had a hard time waking up with hernia surgery as a child.  Marland Kitchen GERD (gastroesophageal reflux disease)   . Headache(784.0)    h/o recurrent vascular headaches. no longer gets these  . Hypercholesterolemia   . Hypertension   . Hypothyroidism   . IBS (irritable bowel syndrome)   . Leg swelling   . Neck fullness 2016   dr. Nicki Reaper reviewed and found it to be nothing.  prinivil seemed to cause face and neck and tongue swelling  . PONV (postoperative nausea and vomiting)   . Transient global amnesia    x1, over 25 yrs ago after migraine  . Urinary tract bacterial infections   . Vertigo    x1 - several yrs ago.  OK after Epley maneuver   Past Surgical History:  Procedure Laterality Date  . ABDOMINAL HYSTERECTOMY    . APPENDECTOMY    . CHOLECYSTECTOMY  1990  . CYSTOSCOPY     x2  . ESOPHAGOGASTRODUODENOSCOPY (EGD) WITH PROPOFOL N/A 08/28/2015   Procedure: ESOPHAGOGASTRODUODENOSCOPY (EGD) WITH PROPOFOL;  Surgeon: Hulen Luster, MD;  Location: [facility] ENDOSCOPY;  Service: Gastroenterology;  Laterality: N/A;  . ESOPHAGOGASTRODUODENOSCOPY (EGD) WITH PROPOFOL N/A 04/04/2017   Procedure: ESOPHAGOGASTRODUODENOSCOPY (EGD) WITH PROPOFOL;  Surgeon: Lollie Sails, MD;  Location:  [facility] ENDOSCOPY;  Service: Endoscopy;  Laterality: N/A;  . PARTIAL HYSTERECTOMY    . PARTIAL KNEE ARTHROPLASTY Left 04/15/2016   Procedure: UNICOMPARTMENTAL KNEE;  Surgeon: Corky Mull, MD;  Location: [facility] ORS;  Service: Orthopedics;  Laterality: Left;  . TONSILLECTOMY     Family History  Problem Relation Age of Onset  . Heart disease Father   . Heart disease Mother   . Heart disease Brother   . Heart disease Brother   . Breast cancer Neg Hx   . Colon cancer Neg Hx    Social History   Socioeconomic History  . Marital status: Married    Spouse name: None  . Number of children: 2  . Years of education: None  . Highest education level: None  Social Needs  . Financial resource strain: None  . Food insecurity - worry: None  . Food insecurity - inability: None  . Transportation needs - medical: None  . Transportation needs - non-medical: None  Occupational History  . Occupation: retired  Tobacco Use  . Smoking status: Never Smoker  . Smokeless tobacco: Never Used  Substance and Sexual Activity  . Alcohol use: No    Alcohol/week: 0.0 oz    Comment: seldom, a glass of wine 1-3 per year.  . Drug use: No  . Sexual  activity: Not Currently  Other Topics Concern  . None  Social History Narrative  . None    Outpatient Encounter Medications as of 10/04/2017  Medication Sig  . amitriptyline (ELAVIL) 25 MG tablet Take 25 mg by mouth at bedtime.  Marland Kitchen amLODipine (NORVASC) 10 MG tablet TAKE 1 TABLET DAILY  . aspirin 81 MG tablet Take 81 mg by mouth daily.  Marland Kitchen atorvastatin (LIPITOR) 40 MG tablet Take 1 tablet (40 mg total) by mouth daily.  . Calcium Carbonate-Vitamin D (CALCIUM 600+D) 600-400 MG-UNIT tablet Take 1 tablet by mouth daily.  . Cholecalciferol (VITAMIN D3) 1000 units CAPS Take by mouth.  . ciclopirox (PENLAC) 8 % solution Apply topically at bedtime. Apply over nail and surrounding skin. Apply daily over previous coat. After seven (7) days, may remove with alcohol and continue  cycle.  . estrogens, conjugated, (PREMARIN) 0.9 MG tablet Take 0.9 mg by mouth daily.   Marland Kitchen levothyroxine (SYNTHROID) 50 MCG tablet Take 1 tablet (50 mcg total) by mouth daily before breakfast.  . metoprolol succinate (TOPROL-XL) 50 MG 24 hr tablet TAKE 1 TABLET TWICE A DAY  WITH OR IMMEDIATELY        FOLLOWING A MEAL  . pantoprazole (PROTONIX) 40 MG tablet TAKE 1 TABLET DAILY   No facility-administered encounter medications on file as of 10/04/2017.     Review of Systems  Constitutional: Negative for appetite change and unexpected weight change.  HENT: Negative for congestion and sinus pressure.   Respiratory: Negative for cough, chest tightness and shortness of breath.   Cardiovascular: Negative for chest pain and palpitations.       No increased lower extremity swelling.   Gastrointestinal: Negative for abdominal pain, diarrhea, nausea and vomiting.  Genitourinary: Negative for difficulty urinating and dysuria.  Musculoskeletal: Negative for joint swelling and myalgias.  Skin: Negative for color change and rash.  Neurological: Negative for dizziness, light-headedness and headaches.  Psychiatric/Behavioral: Negative for agitation and dysphoric mood.       Objective:     Blood pressure rechecked by me:  138/68  Physical Exam  Constitutional: She appears well-developed and well-nourished. No distress.  HENT:  Nose: Nose normal.  Mouth/Throat: Oropharynx is clear and moist.  Neck: Neck supple. No thyromegaly present.  Cardiovascular: Normal rate and regular rhythm.  Pulmonary/Chest: Breath sounds normal. No respiratory distress. She has no wheezes.  Abdominal: Soft. Bowel sounds are normal. There is no tenderness.  Musculoskeletal: She exhibits no edema or tenderness.  Lymphadenopathy:    She has no cervical adenopathy.  Skin: No rash noted. No erythema.  Psychiatric: She has a normal mood and affect. Her behavior is normal.    BP 118/60 (BP Location: Left Arm, Patient  Position: Sitting, Cuff Size: Normal)   Pulse 70   Temp 98.1 F (36.7 C) (Oral)   Resp 16   Wt 156 lb 6.4 oz (70.9 kg)   SpO2 94%   BMI 28.78 kg/m  Wt Readings from Last 3 Encounters:  10/04/17 156 lb 6.4 oz (70.9 kg)  05/31/17 154 lb 12.8 oz (70.2 kg)  04/04/17 153 lb (69.4 kg)     Lab Results  Component Value Date   WBC 7.6 01/25/2017   HGB 13.4 01/25/2017   HCT 39.8 01/25/2017   PLT 217.0 01/25/2017   GLUCOSE 104 (H) 10/03/2017   CHOL 141 10/03/2017   TRIG 179.0 (H) 10/03/2017   HDL 68.30 10/03/2017   LDLDIRECT 64.9 12/08/2012   LDLCALC 37 10/03/2017   ALT 10 10/03/2017  AST 15 10/03/2017   NA 136 10/03/2017   K 4.4 10/03/2017   CL 102 10/03/2017   CREATININE 1.16 10/03/2017   BUN 19 10/03/2017   CO2 25 10/03/2017   TSH 3.12 01/25/2017   INR 1.03 04/07/2016   HGBA1C 5.5 10/03/2017    US Soft Tissue Head/neck  Result Date: 06/22/2017 CLINICAL DATA:  Right thyroid nodule, hypothyroidism, on Synthroid EXAM: THYROID ULTRASOUND TECHNIQUE: Ultrasound examination of the thyroid gland and adjacent soft tissues was performed. COMPARISON:  07/25/2015 FINDINGS: Parenchymal Echotexture: Mildly heterogenous Isthmus: 2 mm Right lobe: 2.9 x 1.1 x 1.1 cm, previously 3.5 x 0.9 x 0.9 cm Left lobe: 2.9 x 0.7 x 1.0 cm, previously 3.0 x 1.0 x 0.8 cm _________________________________________________________ Estimated total number of nodules >/= 1 cm: 0 Number of spongiform nodules >/=  2 cm not described below (TR1): 0 Number of mixed cystic and solid nodules >/= 1.5 cm not described below ([place]): 0 _________________________________________________________ Nodule # 1: Location: Right; Inferior Maximum size: 0.6, previously 0.6 cm; Other 2 dimensions: 0.5 x 0.4, previously 0.6 x 0.5 cm Composition: solid/almost completely solid (2) Echogenicity: hypoechoic (2) Shape: not taller-than-wide (0) Margins: ill-defined (0) Echogenic foci: punctate echogenic foci (3) ACR TI-RADS total points: 7. ACR  TI-RADS risk category: TR5 (>/= 7 points). ACR TI-RADS recommendations: *Given size (>/= 0.5 - 0.9 cm) and appearance, a follow-up ultrasound in 1 year should be considered based on TI-RADS criteria. _________________________________________________________ IMPRESSION: 0.6 cm right inferior TR 5 nodule meets criteria follow-up in 1 year. The above is in keeping with the ACR TI-RADS recommendations - J Am Coll Radiol 2017;14:587-595. Electronically Signed   By: Jerilynn Mages.  Shick M.D.   On: 06/22/2017 15:25       Assessment & Plan:   Problem List Items Addressed This Visit    GERD (gastroesophageal reflux disease)    Controlled on protonix.        Hypercholesterolemia    On lipitor.  Low cholesterol diet and exercise.   Lab Results  Component Value Date   CHOL 141 10/03/2017   HDL 68.30 10/03/2017   LDLCALC 37 10/03/2017   LDLDIRECT 64.9 12/08/2012   TRIG 179.0 (H) 10/03/2017   CHOLHDL 2 10/03/2017        Relevant Orders   Hepatic function panel   Lipid panel   Hyperglycemia    Overall sugar control stable.  a1c 5.5.  Continue low carb diet and exercise.  Follow.        Relevant Orders   Hemoglobin A1c   Hypertension    Blood pressure under good control.  Continue same medication regimen.  Follow pressures.  Follow metabolic panel.        Relevant Orders   CBC with Differential/Platelet   Basic metabolic panel   Hypothyroidism    On thyroid replacement.  Follow tsh.  Recent thyroid ultrasound as outlined.  No change.  Recommended f/u thyroid ultrasound in one year. (thyroid ultrasound 06/22/17).         Relevant Orders   TSH   Leg swelling    Overall stable.  Continue compression hose.       Thyroid nodule    Thyroid ultrasound as outlined.  Stable. Recommended f/u in one year from 06/22/17 test.            Einar Pheasant, MD

## 2017-10-04 NOTE — Progress Notes (Signed)
foll

## 2017-10-07 ENCOUNTER — Encounter: Payer: Self-pay | Admitting: Internal Medicine

## 2017-10-07 NOTE — Assessment & Plan Note (Signed)
Overall stable.  Continue compression hose.

## 2017-10-07 NOTE — Assessment & Plan Note (Signed)
Blood pressure under good control.  Continue same medication regimen.  Follow pressures.  Follow metabolic panel.   

## 2017-10-07 NOTE — Assessment & Plan Note (Signed)
Controlled on protonix.   

## 2017-10-07 NOTE — Assessment & Plan Note (Signed)
Thyroid ultrasound as outlined.  Stable. Recommended f/u in one year from 06/22/17 test.

## 2017-10-07 NOTE — Assessment & Plan Note (Signed)
On thyroid replacement.  Follow tsh.  Recent thyroid ultrasound as outlined.  No change.  Recommended f/u thyroid ultrasound in one year. (thyroid ultrasound 06/22/17).

## 2017-10-07 NOTE — Assessment & Plan Note (Signed)
Overall sugar control stable.  a1c 5.5.  Continue low carb diet and exercise.  Follow.

## 2017-10-07 NOTE — Assessment & Plan Note (Signed)
On lipitor.  Low cholesterol diet and exercise.   Lab Results  Component Value Date   CHOL 141 10/03/2017   HDL 68.30 10/03/2017   LDLCALC 37 10/03/2017   LDLDIRECT 64.9 12/08/2012   TRIG 179.0 (H) 10/03/2017   CHOLHDL 2 10/03/2017

## 2017-10-08 ENCOUNTER — Other Ambulatory Visit: Payer: Self-pay | Admitting: Internal Medicine

## 2017-10-11 NOTE — Discharge Instructions (Signed)

## 2017-10-12 ENCOUNTER — Encounter
Admission: RE | Disposition: A | Discharge status: Home / Self Care | Payer: Self-pay | Source: Ambulatory Visit | Attending: Ophthalmology

## 2017-10-12 ENCOUNTER — Ambulatory Visit: Payer: Medicare Other | Admitting: Anesthesiology

## 2017-10-12 ENCOUNTER — Ambulatory Visit
Admission: RE | Admit: 2017-10-12 | Discharge: 2017-10-12 | Disposition: A | Discharge status: Home / Self Care | Payer: Medicare Other | Source: Ambulatory Visit | Attending: Ophthalmology | Admitting: Ophthalmology

## 2017-10-12 DIAGNOSIS — M810 Age-related osteoporosis without current pathological fracture: Secondary | ICD-10-CM | POA: Insufficient documentation

## 2017-10-12 DIAGNOSIS — I10 Essential (primary) hypertension: Secondary | ICD-10-CM | POA: Diagnosis not present

## 2017-10-12 DIAGNOSIS — E039 Hypothyroidism, unspecified: Secondary | ICD-10-CM | POA: Insufficient documentation

## 2017-10-12 DIAGNOSIS — Z9049 Acquired absence of other specified parts of digestive tract: Secondary | ICD-10-CM | POA: Diagnosis not present

## 2017-10-12 DIAGNOSIS — K449 Diaphragmatic hernia without obstruction or gangrene: Secondary | ICD-10-CM | POA: Diagnosis not present

## 2017-10-12 DIAGNOSIS — Z85828 Personal history of other malignant neoplasm of skin: Secondary | ICD-10-CM | POA: Insufficient documentation

## 2017-10-12 DIAGNOSIS — Z90710 Acquired absence of both cervix and uterus: Secondary | ICD-10-CM | POA: Diagnosis not present

## 2017-10-12 DIAGNOSIS — K219 Gastro-esophageal reflux disease without esophagitis: Secondary | ICD-10-CM | POA: Diagnosis not present

## 2017-10-12 DIAGNOSIS — K5790 Diverticulosis of intestine, part unspecified, without perforation or abscess without bleeding: Secondary | ICD-10-CM | POA: Diagnosis not present

## 2017-10-12 DIAGNOSIS — Z881 Allergy status to other antibiotic agents status: Secondary | ICD-10-CM | POA: Diagnosis not present

## 2017-10-12 DIAGNOSIS — H2511 Age-related nuclear cataract, right eye: Secondary | ICD-10-CM | POA: Diagnosis not present

## 2017-10-12 DIAGNOSIS — Z91048 Other nonmedicinal substance allergy status: Secondary | ICD-10-CM | POA: Insufficient documentation

## 2017-10-12 DIAGNOSIS — Z888 Allergy status to other drugs, medicaments and biological substances status: Secondary | ICD-10-CM | POA: Insufficient documentation

## 2017-10-12 HISTORY — DX: Dizziness and giddiness: R42

## 2017-10-12 HISTORY — PX: CATARACT EXTRACTION W/PHACO: SHX586

## 2017-10-12 HISTORY — DX: Unspecified osteoarthritis, unspecified site: M19.90

## 2017-10-12 HISTORY — DX: Transient global amnesia: G45.4

## 2017-10-12 SURGERY — PHACOEMULSIFICATION, CATARACT, WITH IOL INSERTION
Anesthesia: Monitor Anesthesia Care | Laterality: Right | Wound class: Clean

## 2017-10-12 MED ORDER — MIDAZOLAM HCL 2 MG/2ML IJ SOLN
INTRAMUSCULAR | Status: DC | PRN
  Administered 2017-10-12: 1 mg via INTRAVENOUS

## 2017-10-12 MED ORDER — EPINEPHRINE PF 1 MG/ML IJ SOLN
INTRAOCULAR | Status: DC | PRN
  Administered 2017-10-12: 67 mL via OPHTHALMIC

## 2017-10-12 MED ORDER — MOXIFLOXACIN HCL 0.5 % OP SOLN
1.0000 [drp] | OPHTHALMIC | Status: DC | PRN
  Administered 2017-10-12 (×3): 1 [drp] via OPHTHALMIC

## 2017-10-12 MED ORDER — FENTANYL CITRATE (PF) 100 MCG/2ML IJ SOLN
INTRAMUSCULAR | Status: DC | PRN
  Administered 2017-10-12: 50 ug via INTRAVENOUS

## 2017-10-12 MED ORDER — LIDOCAINE HCL (PF) 2 % IJ SOLN
INTRAOCULAR | Status: DC | PRN
  Administered 2017-10-12: 1 mL via INTRAMUSCULAR

## 2017-10-12 MED ORDER — NA HYALUR & NA CHOND-NA HYALUR 0.4-0.35 ML IO KIT
PACK | INTRAOCULAR | Status: DC | PRN
  Administered 2017-10-12: 1 mL via INTRAOCULAR

## 2017-10-12 MED ORDER — CEFUROXIME OPHTHALMIC INJECTION 1 MG/0.1 ML
INJECTION | OPHTHALMIC | Status: DC | PRN
  Administered 2017-10-12: 0.1 mL via INTRACAMERAL

## 2017-10-12 MED ORDER — BRIMONIDINE TARTRATE-TIMOLOL 0.2-0.5 % OP SOLN
OPHTHALMIC | Status: DC | PRN
  Administered 2017-10-12: 1 [drp] via OPHTHALMIC

## 2017-10-12 MED ORDER — ARMC OPHTHALMIC DILATING DROPS
1.0000 "application " | OPHTHALMIC | Status: DC | PRN
  Administered 2017-10-12 (×3): 1 via OPHTHALMIC

## 2017-10-12 SURGICAL SUPPLY — 25 items
CANNULA ANT/CHMB 27GA (MISCELLANEOUS) ×3 IMPLANT
CARTRIDGE ABBOTT (MISCELLANEOUS) IMPLANT
GLOVE SURG LX 7.5 STRW (GLOVE) ×2
GLOVE SURG LX STRL 7.5 STRW (GLOVE) ×1 IMPLANT
GLOVE SURG TRIUMPH 8.0 PF LTX (GLOVE) ×3 IMPLANT
GOWN STRL REUS W/ TWL LRG LVL3 (GOWN DISPOSABLE) ×2 IMPLANT
GOWN STRL REUS W/TWL LRG LVL3 (GOWN DISPOSABLE) ×4
LENS IOL ACRYSOF IQ 22.5 (Intraocular Lens) ×3 IMPLANT
MARKER SKIN DUAL TIP RULER LAB (MISCELLANEOUS) ×3 IMPLANT
NDL RETROBULBAR .5 NSTRL (NEEDLE) IMPLANT
NEEDLE FILTER BLUNT 18X 1/2SAF (NEEDLE) ×2
NEEDLE FILTER BLUNT 18X1 1/2 (NEEDLE) ×1 IMPLANT
PACK CATARACT BRASINGTON (MISCELLANEOUS) ×3 IMPLANT
PACK EYE AFTER SURG (MISCELLANEOUS) ×3 IMPLANT
PACK OPTHALMIC (MISCELLANEOUS) ×3 IMPLANT
RING MALYGIN 7.0 (MISCELLANEOUS) IMPLANT
SUT ETHILON 10-0 CS-B-6CS-B-6 (SUTURE)
SUT VICRYL  9 0 (SUTURE)
SUT VICRYL 9 0 (SUTURE) IMPLANT
SUTURE EHLN 10-0 CS-B-6CS-B-6 (SUTURE) IMPLANT
SYR 3ML LL SCALE MARK (SYRINGE) ×3 IMPLANT
SYR 5ML LL (SYRINGE) ×3 IMPLANT
SYR TB 1ML LUER SLIP (SYRINGE) ×3 IMPLANT
WATER STERILE IRR 250ML POUR (IV SOLUTION) ×3 IMPLANT
WIPE NON LINTING 3.25X3.25 (MISCELLANEOUS) ×3 IMPLANT

## 2017-10-12 NOTE — Transfer of Care (Signed)
Immediate Anesthesia Transfer of Care Note  Patient: Elizabeth Henderson  Procedure(s) Performed: CATARACT EXTRACTION PHACO AND INTRAOCULAR LENS PLACEMENT (IOC) RIGHT (Right )  Patient Location: PACU  Anesthesia Type: MAC  Level of Consciousness: awake, alert  and patient cooperative  Airway and Oxygen Therapy: Patient Spontanous Breathing and Patient connected to supplemental oxygen  Post-op Assessment: Post-op Vital signs reviewed, Patient's Cardiovascular Status Stable, Respiratory Function Stable, Patent Airway and No signs of Nausea or vomiting  Post-op Vital Signs: Reviewed and stable  Complications: No apparent anesthesia complications

## 2017-10-12 NOTE — Anesthesia Preprocedure Evaluation (Signed)
Anesthesia Evaluation  Patient identified by MRN, date of birth, ID band Patient awake    Reviewed: Allergy & Precautions, H&P , NPO status , Patient's Chart, lab work & pertinent test results, reviewed documented beta blocker date and time   History of Anesthesia Complications (+) PONV, Family history of anesthesia reaction and history of anesthetic complications  Airway Mallampati: II  TM Distance: >3 FB Neck ROM: full    Dental no notable dental hx.    Pulmonary neg pulmonary ROS,    Pulmonary exam normal breath sounds clear to auscultation       Cardiovascular Exercise Tolerance: Good hypertension,  Rhythm:regular Rate:Normal     Neuro/Psych  Headaches, negative psych ROS   GI/Hepatic Neg liver ROS, GERD  Medicated and Controlled,  Endo/Other  Hypothyroidism   Renal/GU negative Renal ROS  negative genitourinary   Musculoskeletal   Abdominal   Peds  Hematology negative hematology ROS (+)   Anesthesia Other Findings   Reproductive/Obstetrics negative OB ROS                             Anesthesia Physical Anesthesia Plan  ASA: II  Anesthesia Plan: MAC   Post-op Pain Management:    Induction:   PONV Risk Score and Plan: Midazolam and Treatment may vary due to age or medical condition  Airway Management Planned:   Additional Equipment:   Intra-op Plan:   Post-operative Plan:   Informed Consent: I have reviewed the patients History and Physical, chart, labs and discussed the procedure including the risks, benefits and alternatives for the proposed anesthesia with the patient or authorized representative who has indicated his/her understanding and acceptance.     Plan Discussed with: CRNA  Anesthesia Plan Comments:         Anesthesia Quick Evaluation

## 2017-10-12 NOTE — H&P (Signed)
The History and Physical notes are on paper, have been signed, and are to be scanned. The patient remains stable and unchanged from the H&P.   Previous H&P reviewed, patient examined, and there are no changes.  Casin Federici 10/12/2017 10:45 AM

## 2017-10-12 NOTE — Op Note (Signed)
LOCATION:  [place]   PREOPERATIVE DIAGNOSIS:    Nuclear sclerotic cataract right eye. H25.11   POSTOPERATIVE DIAGNOSIS:  Nuclear sclerotic cataract right eye.     PROCEDURE:  Phacoemusification with posterior chamber intraocular lens placement of the right eye   LENS:   Implant Name Type Inv. Item Serial No. Manufacturer Lot No. LRB No. Used  LENS IOL ACRYSOF IQ 22.5 - [id number] Intraocular Lens LENS IOL ACRYSOF IQ 22.5 [id number] ALCON  Right 1        ULTRASOUND TIME: 15 % of 0 minutes, 47 seconds.  CDE 7.0   SURGEON:  Wyonia Hough, MD   ANESTHESIA:  Topical with tetracaine drops and 2% Xylocaine jelly, augmented with 1% preservative-free intracameral lidocaine.    COMPLICATIONS:  None.   DESCRIPTION OF PROCEDURE:  The patient was identified in the holding room and transported to the operating room and placed in the supine position under the operating microscope.  The right eye was identified as the operative eye and it was prepped and draped in the usual sterile ophthalmic fashion.   A 1 millimeter clear-corneal paracentesis was made at the 12:00 position.  0.5 ml of preservative-free 1% lidocaine was injected into the anterior chamber. The anterior chamber was filled with Viscoat viscoelastic.  A 2.4 millimeter keratome was used to make a near-clear corneal incision at the 9:00 position.  A curvilinear capsulorrhexis was made with a cystotome and capsulorrhexis forceps.  Balanced salt solution was used to hydrodissect and hydrodelineate the nucleus.   Phacoemulsification was then used in stop and chop fashion to remove the lens nucleus and epinucleus.  The remaining cortex was then removed using the irrigation and aspiration handpiece. Provisc was then placed into the capsular bag to distend it for lens placement.  A lens was then injected into the capsular bag.  The remaining viscoelastic was aspirated.   Wounds were hydrated with balanced salt solution.   The anterior chamber was inflated to a physiologic pressure with balanced salt solution.  No wound leaks were noted. Cefuroxime 0.1 ml of a 10mg /ml solution was injected into the anterior chamber for a dose of 1 mg of intracameral antibiotic at the completion of the case.   Timolol and Brimonidine drops were applied to the eye.  The patient was taken to the recovery room in stable condition without complications of anesthesia or surgery.   Nichoel Digiulio 10/12/2017, 11:34 AM

## 2017-10-12 NOTE — Anesthesia Postprocedure Evaluation (Signed)
Anesthesia Post Note  Patient: Elizabeth Henderson  Procedure(s) Performed: CATARACT EXTRACTION PHACO AND INTRAOCULAR LENS PLACEMENT (I[facility]) RIGHT (Right )  Patient location during evaluation: PACU Anesthesia Type: MAC Level of consciousness: awake and alert Pain management: pain level controlled Vital Signs Assessment: post-procedure vital signs reviewed and stable Respiratory status: spontaneous breathing, nonlabored ventilation and respiratory function stable Cardiovascular status: stable and blood pressure returned to baseline Postop Assessment: no apparent nausea or vomiting Anesthetic complications: no    Theresa Wedel D Maeby Vankleeck

## 2017-10-17 DIAGNOSIS — Z1231 Encounter for screening mammogram for malignant neoplasm of breast: Secondary | ICD-10-CM | POA: Diagnosis not present

## 2017-10-17 DIAGNOSIS — Z79890 Hormone replacement therapy (postmenopausal): Secondary | ICD-10-CM | POA: Diagnosis not present

## 2017-10-17 DIAGNOSIS — Z0000 Encounter for general adult medical examination without abnormal findings: Secondary | ICD-10-CM | POA: Diagnosis not present

## 2017-10-17 DIAGNOSIS — N94810 Vulvar vestibulitis: Secondary | ICD-10-CM | POA: Diagnosis not present

## 2017-10-17 DIAGNOSIS — Z780 Asymptomatic menopausal state: Secondary | ICD-10-CM | POA: Diagnosis not present

## 2017-10-26 DIAGNOSIS — H2512 Age-related nuclear cataract, left eye: Secondary | ICD-10-CM | POA: Diagnosis not present

## 2017-10-31 ENCOUNTER — Other Ambulatory Visit: Payer: Self-pay | Admitting: Internal Medicine

## 2017-11-02 ENCOUNTER — Other Ambulatory Visit: Payer: Self-pay

## 2017-11-02 ENCOUNTER — Encounter: Payer: Self-pay | Admitting: *Deleted

## 2017-11-02 ENCOUNTER — Ambulatory Visit: Payer: Medicare Other

## 2017-11-03 NOTE — Discharge Instructions (Signed)

## 2017-11-04 ENCOUNTER — Ambulatory Visit (INDEPENDENT_AMBULATORY_CARE_PROVIDER_SITE_OTHER): Payer: Medicare Other

## 2017-11-04 VITALS — BP 122/72 | HR 66 | Temp 98.3°F | Resp 14 | Ht 61.5 in | Wt 156.8 lb

## 2017-11-04 DIAGNOSIS — Z0000 Encounter for general adult medical examination without abnormal findings: Secondary | ICD-10-CM

## 2017-11-04 NOTE — Progress Notes (Signed)
Subjective:   Elizabeth Henderson is a 84 y.o. female who presents for Medicare Annual (Subsequent) preventive examination.  Review of Systems:  No ROS.  Medicare Wellness Visit. Additional risk factors are reflected in the social history.  Cardiac Risk Factors include: advanced age (>42men, >59 women);hypertension     Objective:     Vitals: BP 122/72 (BP Location: Left Arm, Patient Position: Sitting, Cuff Size: Normal)   Pulse 66   Temp 98.3 F (36.8 C) (Oral)   Resp 14   Ht 5' 1.5" (1.562 m)   Wt 156 lb 12.8 oz (71.1 kg)   SpO2 95%   BMI 29.15 kg/m   Body mass index is 29.15 kg/m.  Advanced Directives 11/04/2017 10/12/2017 04/04/2017 11/01/2016 04/15/2016 04/15/2016 04/07/2016  Does Patient Have a Medical Advance Directive? No No No No No No No  Type of Advance Directive - - - - - - -  Does patient want to make changes to medical advance directive? - - - - - - -  Copy of [place] in Chart? - - - - - - -  Would patient like information on creating a medical advance directive? No - Patient declined No - Patient declined - No - Patient declined No - patient declined information - Yes - Scientist, clinical (histocompatibility and immunogenetics) given    Tobacco Social History   Tobacco Use  Smoking Status Never Smoker  Smokeless Tobacco Never Used     Counseling given: Not Answered   Clinical Intake:  Pre-visit preparation completed: Yes  Pain : No/denies pain     Nutritional Status: BMI 25 -29 Overweight Diabetes: No  How often do you need to have someone help you when you read instructions, pamphlets, or other written materials from your doctor or pharmacy?: 1 - Never  Interpreter Needed?: No     Past Medical History:  Diagnosis Date  . Arthritis    lower back, hands  . Benign esophageal stricture   . Complication of anesthesia   . Diverticulosis   . Diverticulosis   . Dysphagia   . Family history of adverse reaction to anesthesia    brother had a hard time waking up  with hernia surgery as a child.  Marland Kitchen GERD (gastroesophageal reflux disease)   . Headache(784.0)    h/o recurrent vascular headaches. no longer gets these  . Hypercholesterolemia   . Hypertension   . Hypothyroidism   . IBS (irritable bowel syndrome)   . Leg swelling   . Neck fullness 2016   dr. Nicki Reaper reviewed and found it to be nothing.  prinivil seemed to cause face and neck and tongue swelling  . PONV (postoperative nausea and vomiting)   . Transient global amnesia    x1, over 25 yrs ago after migraine  . Urinary tract bacterial infections   . Vertigo    x1 - several yrs ago.  OK after Epley maneuver   Past Surgical History:  Procedure Laterality Date  . ABDOMINAL HYSTERECTOMY    . APPENDECTOMY    . CATARACT EXTRACTION W/PHACO Right 10/12/2017   Procedure: CATARACT EXTRACTION PHACO AND INTRAOCULAR LENS PLACEMENT ([place]) RIGHT;  Surgeon: Leandrew Koyanagi, MD;  Location: [facility];  Service: Ophthalmology;  Laterality: Right;  . CHOLECYSTECTOMY  1990  . CYSTOSCOPY     x2  . ESOPHAGOGASTRODUODENOSCOPY (EGD) WITH PROPOFOL N/A 08/28/2015   Procedure: ESOPHAGOGASTRODUODENOSCOPY (EGD) WITH PROPOFOL;  Surgeon: Hulen Luster, MD;  Location: [facility] ENDOSCOPY;  Service: Gastroenterology;  Laterality: N/A;  .  ESOPHAGOGASTRODUODENOSCOPY (EGD) WITH PROPOFOL N/A 04/04/2017   Procedure: ESOPHAGOGASTRODUODENOSCOPY (EGD) WITH PROPOFOL;  Surgeon: Lollie Sails, MD;  Location[facility] ENDOSCOPY;  Service: Endoscopy;  Laterality: N/A;  . PARTIAL HYSTERECTOMY    . PARTIAL KNEE ARTHROPLASTY Left 04/15/2016   Procedure: UNICOMPARTMENTAL KNEE;  Surgeon: Corky Mull, MD;  Location: [facility] ORS;  Service: Orthopedics;  Laterality: Left;  . TONSILLECTOMY     Family History  Problem Relation Age of Onset  . Heart disease Father   . Heart disease Mother   . Heart disease Brother   . Heart disease Brother   . Breast cancer Neg Hx   . Colon cancer Neg Hx    Social History   Socioeconomic History  .  Marital status: Married    Spouse name: None  . Number of children: 2  . Years of education: None  . Highest education level: None  Social Needs  . Financial resource strain: None  . Food insecurity - worry: None  . Food insecurity - inability: None  . Transportation needs - medical: None  . Transportation needs - non-medical: None  Occupational History  . Occupation: retired  Tobacco Use  . Smoking status: Never Smoker  . Smokeless tobacco: Never Used  Substance and Sexual Activity  . Alcohol use: No    Alcohol/week: 0.0 oz    Comment: seldom, a glass of wine 1-3 per year.  . Drug use: No  . Sexual activity: Not Currently  Other Topics Concern  . None  Social History Narrative  . None    Outpatient Encounter Medications as of 11/04/2017  Medication Sig  . amitriptyline (ELAVIL) 25 MG tablet Take 25 mg by mouth at bedtime.  Marland Kitchen amLODipine (NORVASC) 10 MG tablet TAKE 1 TABLET DAILY  . aspirin 81 MG tablet Take 81 mg by mouth daily.  Marland Kitchen atorvastatin (LIPITOR) 40 MG tablet Take 1 tablet (40 mg total) by mouth daily.  . Calcium Carbonate-Vitamin D (CALCIUM 600+D) 600-400 MG-UNIT tablet Take 1 tablet by mouth daily.  . Cholecalciferol (VITAMIN D3) 1000 units CAPS Take by mouth.  . ciclopirox (PENLAC) 8 % solution Apply topically at bedtime. Apply over nail and surrounding skin. Apply daily over previous coat. After seven (7) days, may remove with alcohol and continue cycle.  . estrogens, conjugated, (PREMARIN) 0.9 MG tablet Take 0.9 mg by mouth daily.   Marland Kitchen levothyroxine (SYNTHROID) 50 MCG tablet Take 1 tablet (50 mcg total) by mouth daily before breakfast.  . metoprolol succinate (TOPROL-XL) 50 MG 24 hr tablet TAKE 1 TABLET TWICE A DAY  WITH OR IMMEDIATELY        FOLLOWING A MEAL  . pantoprazole (PROTONIX) 40 MG tablet TAKE 1 TABLET DAILY  . SYNTHROID 50 MCG tablet TAKE 1 TABLET DAILY BEFORE BREAKFAST   No facility-administered encounter medications on file as of 11/04/2017.      Activities of Daily Living In your present state of health, do you have any difficulty performing the following activities: 11/04/2017 10/12/2017  Hearing? N N  Vision? N N  Difficulty concentrating or making decisions? N N  Walking or climbing stairs? N N  Dressing or bathing? N N  Doing errands, shopping? N -  Preparing Food and eating ? N -  Using the Toilet? N -  In the past six months, have you accidently leaked urine? N -  Do you have problems with loss of bowel control? N -  Managing your Medications? N -  Managing your Finances? N -  Housekeeping or managing your Housekeeping? N -  Some recent data might be hidden    Patient Care Team: Einar Pheasant, MD as PCP - General (Internal Medicine)    Assessment:   This is a routine wellness examination for [place].  The goal of the wellness visit is to assist the patient how to close the gaps in care and create a preventative care plan for the patient.   The roster of all physicians providing medical care to patient is listed in the Snapshot section of the chart.  Taking calcium VIT D as appropriate/Osteoporosis risk reviewed.    Safety issues reviewed; Smoke and carbon monoxide detectors in the home. No firearms or firearms locked in a safe within the home. Wears seatbelts when driving or riding with others. No violence in the home.  They do not have excessive sun exposure.  Discussed the need for sun protection: hats, long sleeves and the use of sunscreen if there is significant sun exposure.  Patient is alert, normal appearance, oriented to person/place/and time.  Correctly identified the president of the [place] and recalls of 2/3 words. Performs simple calculations and can read correct time from watch face.  Displays appropriate judgement.  No new identified risk were noted.  No failures at ADL's or IADL's.    BMI- discussed the importance of a healthy diet, water intake and the benefits of aerobic exercise. She has  a healthy diet, walks for exercise and plans to increase her water intake.  24 hour diet recall: Low cholesterol diet  Daily fluid intake: 1 cups of caffeine, 3 cups of water.  Dental- every 6 months.   Eye- Visual acuity not assessed per patient preference since they have regular follow up with the ophthalmologist.    Sleep patterns- Sleeps 6-8 hours at night.  Wakes feeling rested.  TDAP vaccine deferred per patient preference.  Follow up with insurance.  Educational material provided.  Patient Concerns: None at this time. Follow up with PCP as needed.  Exercise Activities and Dietary recommendations Current Exercise Habits: Home exercise routine, Type of exercise: walking, Time (Minutes): 20, Frequency (Times/Week): 4, Weekly Exercise (Minutes/Week): 80  Goals    . Healthy Lifestyle     Stay hydrated! Drink plenty of fluids/water Stay active and continue home exercises. Walk as tolerated Low carb foods        Fall Risk Fall Risk  11/04/2017 11/01/2016 07/09/2016 10/27/2015 12/23/2014  Falls in the past year? No No No No No   Depression Screen PHQ 2/9 Scores 11/04/2017 11/01/2016 07/09/2016 10/27/2015  PHQ - 2 Score 0 0 0 0     Cognitive Function MMSE - Mini Mental State Exam 11/04/2017 11/01/2016 10/27/2015  Orientation to time 5 5 5   Orientation to Place 5 5 5   Registration 3 3 3   Attention/ Calculation 5 5 5   Recall 2 3 3   Language- name 2 objects 2 2 2   Language- repeat 1 1 1   Language- follow 3 step command 3 3 3   Language- read & follow direction 1 1 1   Write a sentence 1 1 1   Copy design 1 1 1   Total score 29 30 30         Immunization History  Administered Date(s) Administered  . Influenza Split 07/17/2013  . Influenza, High Dose Seasonal PF 07/09/2016, 05/31/2017  . Influenza,inj,Quad PF,6+ Mos 06/20/2014, 07/02/2015  . Pneumococcal Conjugate-13 12/17/2013  . Pneumococcal Polysaccharide-23 01/28/2017  . Zoster 12/23/2011    Screening Tests Health  Maintenance  Topic  Date Due  . TETANUS/TDAP  07/24/1951  . MAMMOGRAM  10/17/2018  . INFLUENZA VACCINE  Completed  . DEXA SCAN  Completed  . PNA vac Low Risk Adult  Completed       Plan:    End of life planning; Advanced aging; Advanced directives discussed.  No HCPOA/Living Will.  Additional information declined at this time.  I have personally reviewed and noted the following in the patient's chart:   . Medical and social history . Use of alcohol, tobacco or illicit drugs  . Current medications and supplements . Functional ability and status . Nutritional status . Physical activity . Advanced directives . List of other physicians . Hospitalizations, surgeries, and ER visits in previous 12 months . Vitals . Screenings to include cognitive, depression, and falls . Referrals and appointments  In addition, I have reviewed and discussed with patient certain preventive protocols, quality metrics, and best practice recommendations. A written personalized care plan for preventive services as well as general preventive health recommendations were provided to patient.     Varney Biles, LPN  0/11/4915   Reviewed above information.  Agree with assessment and plan.    Dr Nicki Reaper

## 2017-11-04 NOTE — Patient Instructions (Addendum)
  Elizabeth Henderson , Thank you for taking time to come for your Medicare Wellness Visit. I appreciate your ongoing commitment to your health goals. Please review the following plan we discussed and let me know if I can assist you in the future.   Follow up with Dr. Nicki Reaper as needed.    Have a great day!  These are the goals we discussed: Goals    . Healthy Lifestyle     Stay hydrated! Drink plenty of fluids/water Stay active and continue home exercises. Walk as tolerated Low carb foods        This is a list of the screening recommended for you and due dates:  Health Maintenance  Topic Date Due  . Tetanus Vaccine  07/24/1951  . Mammogram  10/17/2018  . Flu Shot  Completed  . DEXA scan (bone density measurement)  Completed  . Pneumonia vaccines  Completed

## 2017-11-09 ENCOUNTER — Ambulatory Visit: Payer: Medicare Other | Admitting: Anesthesiology

## 2017-11-09 ENCOUNTER — Ambulatory Visit
Admission: RE | Admit: 2017-11-09 | Discharge: 2017-11-09 | Disposition: A | Discharge status: Home / Self Care | Payer: Medicare Other | Source: Ambulatory Visit | Attending: Ophthalmology | Admitting: Ophthalmology

## 2017-11-09 ENCOUNTER — Encounter
Admission: RE | Disposition: A | Discharge status: Home / Self Care | Payer: Self-pay | Source: Ambulatory Visit | Attending: Ophthalmology

## 2017-11-09 DIAGNOSIS — K219 Gastro-esophageal reflux disease without esophagitis: Secondary | ICD-10-CM | POA: Diagnosis not present

## 2017-11-09 DIAGNOSIS — I10 Essential (primary) hypertension: Secondary | ICD-10-CM | POA: Diagnosis not present

## 2017-11-09 DIAGNOSIS — M810 Age-related osteoporosis without current pathological fracture: Secondary | ICD-10-CM | POA: Insufficient documentation

## 2017-11-09 DIAGNOSIS — H2512 Age-related nuclear cataract, left eye: Secondary | ICD-10-CM | POA: Diagnosis not present

## 2017-11-09 DIAGNOSIS — E7800 Pure hypercholesterolemia, unspecified: Secondary | ICD-10-CM | POA: Insufficient documentation

## 2017-11-09 DIAGNOSIS — E039 Hypothyroidism, unspecified: Secondary | ICD-10-CM | POA: Diagnosis not present

## 2017-11-09 DIAGNOSIS — Z85828 Personal history of other malignant neoplasm of skin: Secondary | ICD-10-CM | POA: Insufficient documentation

## 2017-11-09 HISTORY — PX: CATARACT EXTRACTION W/PHACO: SHX586

## 2017-11-09 SURGERY — PHACOEMULSIFICATION, CATARACT, WITH IOL INSERTION
Anesthesia: Monitor Anesthesia Care | Laterality: Left | Wound class: Clean

## 2017-11-09 MED ORDER — ARMC OPHTHALMIC DILATING DROPS
1.0000 "application " | OPHTHALMIC | Status: DC | PRN
  Administered 2017-11-09 (×3): 1 via OPHTHALMIC

## 2017-11-09 MED ORDER — LACTATED RINGERS IV SOLN: INTRAVENOUS | Status: DC

## 2017-11-09 MED ORDER — NA HYALUR & NA CHOND-NA HYALUR 0.4-0.35 ML IO KIT
PACK | INTRAOCULAR | Status: DC | PRN
  Administered 2017-11-09: 1 mL via INTRAOCULAR

## 2017-11-09 MED ORDER — CEFUROXIME OPHTHALMIC INJECTION 1 MG/0.1 ML
INJECTION | OPHTHALMIC | Status: DC | PRN
  Administered 2017-11-09: 0.1 mL via INTRACAMERAL

## 2017-11-09 MED ORDER — MOXIFLOXACIN HCL 0.5 % OP SOLN
1.0000 [drp] | OPHTHALMIC | Status: DC | PRN
  Administered 2017-11-09 (×3): 1 [drp] via OPHTHALMIC

## 2017-11-09 MED ORDER — ACETAMINOPHEN 160 MG/5ML PO SOLN: 325.0000 mg | ORAL | Status: DC | PRN

## 2017-11-09 MED ORDER — EPINEPHRINE PF 1 MG/ML IJ SOLN
INTRAOCULAR | Status: DC | PRN
  Administered 2017-11-09: 67 mL via OPHTHALMIC

## 2017-11-09 MED ORDER — ACETAMINOPHEN 325 MG PO TABS
650.0000 mg | ORAL_TABLET | Freq: Once | ORAL | Status: DC | PRN
Start: 2017-11-09 — End: 2017-11-09

## 2017-11-09 MED ORDER — BRIMONIDINE TARTRATE-TIMOLOL 0.2-0.5 % OP SOLN
OPHTHALMIC | Status: DC | PRN
  Administered 2017-11-09: 1 [drp] via OPHTHALMIC

## 2017-11-09 MED ORDER — MIDAZOLAM HCL 2 MG/2ML IJ SOLN
INTRAMUSCULAR | Status: DC | PRN
  Administered 2017-11-09: 1.5 mg via INTRAVENOUS

## 2017-11-09 MED ORDER — FENTANYL CITRATE (PF) 100 MCG/2ML IJ SOLN
INTRAMUSCULAR | Status: DC | PRN
  Administered 2017-11-09: 50 ug via INTRAVENOUS

## 2017-11-09 MED ORDER — ONDANSETRON HCL 4 MG/2ML IJ SOLN: 4.0000 mg | Freq: Once | INTRAMUSCULAR | Status: DC | PRN

## 2017-11-09 MED ORDER — LIDOCAINE HCL (PF) 2 % IJ SOLN
INTRAOCULAR | Status: DC | PRN
  Administered 2017-11-09: 1 mL via INTRAMUSCULAR

## 2017-11-09 SURGICAL SUPPLY — 27 items
CANNULA ANT/CHMB 27GA (MISCELLANEOUS) ×2 IMPLANT
CARTRIDGE ABBOTT (MISCELLANEOUS) IMPLANT
GLOVE SURG LX 7.5 STRW (GLOVE) ×1
GLOVE SURG LX STRL 7.5 STRW (GLOVE) ×1 IMPLANT
GLOVE SURG TRIUMPH 8.0 PF LTX (GLOVE) ×2 IMPLANT
GOWN STRL REUS W/ TWL LRG LVL3 (GOWN DISPOSABLE) ×2 IMPLANT
GOWN STRL REUS W/TWL LRG LVL3 (GOWN DISPOSABLE) ×2
LENS IOL ACRSF IQ TRC 3 23.5 ×1 IMPLANT
LENS IOL ACRYSOF IQ TORIC 23.5 ×1 IMPLANT
LENS IOL IQ TORIC 3 23.5 ×1 IMPLANT
MARKER SKIN DUAL TIP RULER LAB (MISCELLANEOUS) ×2 IMPLANT
NDL RETROBULBAR .5 NSTRL (NEEDLE) IMPLANT
NEEDLE FILTER BLUNT 18X 1/2SAF (NEEDLE) ×1
NEEDLE FILTER BLUNT 18X1 1/2 (NEEDLE) ×1 IMPLANT
PACK CATARACT BRASINGTON (MISCELLANEOUS) ×2 IMPLANT
PACK EYE AFTER SURG (MISCELLANEOUS) ×2 IMPLANT
PACK OPTHALMIC (MISCELLANEOUS) ×2 IMPLANT
RING MALYGIN 7.0 (MISCELLANEOUS) IMPLANT
SUT ETHILON 10-0 CS-B-6CS-B-6 (SUTURE)
SUT VICRYL  9 0 (SUTURE)
SUT VICRYL 9 0 (SUTURE) IMPLANT
SUTURE EHLN 10-0 CS-B-6CS-B-6 (SUTURE) IMPLANT
SYR 3ML LL SCALE MARK (SYRINGE) ×2 IMPLANT
SYR 5ML LL (SYRINGE) ×2 IMPLANT
SYR TB 1ML LUER SLIP (SYRINGE) ×2 IMPLANT
WATER STERILE IRR 500ML POUR (IV SOLUTION) ×2 IMPLANT
WIPE NON LINTING 3.25X3.25 (MISCELLANEOUS) ×2 IMPLANT

## 2017-11-09 NOTE — Transfer of Care (Signed)
Immediate Anesthesia Transfer of Care Note  Patient: Elizabeth Henderson  Procedure(s) Performed: CATARACT EXTRACTION PHACO AND INTRAOCULAR LENS PLACEMENT ([facility]) LEFT toric (Left )  Patient Location: PACU  Anesthesia Type: MAC  Level of Consciousness: awake, alert  and patient cooperative  Airway and Oxygen Therapy: Patient Spontanous Breathing and Patient connected to supplemental oxygen  Post-op Assessment: Post-op Vital signs reviewed, Patient's Cardiovascular Status Stable, Respiratory Function Stable, Patent Airway and No signs of Nausea or vomiting  Post-op Vital Signs: Reviewed and stable  Complications: No apparent anesthesia complications

## 2017-11-09 NOTE — Anesthesia Preprocedure Evaluation (Signed)
Anesthesia Evaluation  Patient identified by MRN, date of birth, ID band Patient awake    Reviewed: Allergy & Precautions, NPO status , Patient's Chart, lab work & pertinent test results  History of Anesthesia Complications (+) PONV and history of anesthetic complications  Airway Mallampati: I  TM Distance: >3 FB Neck ROM: Full    Dental no notable dental hx.    Pulmonary neg pulmonary ROS,    Pulmonary exam normal breath sounds clear to auscultation       Cardiovascular Exercise Tolerance: Good hypertension, Normal cardiovascular exam Rhythm:Regular Rate:Normal     Neuro/Psych negative neurological ROS     GI/Hepatic GERD  ,  Endo/Other  Hypothyroidism   Renal/GU negative Renal ROS     Musculoskeletal   Abdominal   Peds  Hematology negative hematology ROS (+)   Anesthesia Other Findings   Reproductive/Obstetrics                             Anesthesia Physical Anesthesia Plan  ASA: II  Anesthesia Plan: MAC   Post-op Pain Management:    Induction: Intravenous  PONV Risk Score and Plan: 2 and Midazolam  Airway Management Planned: Natural Airway  Additional Equipment:   Intra-op Plan:   Post-operative Plan:   Informed Consent: I have reviewed the patients History and Physical, chart, labs and discussed the procedure including the risks, benefits and alternatives for the proposed anesthesia with the patient or authorized representative who has indicated his/her understanding and acceptance.     Plan Discussed with: CRNA  Anesthesia Plan Comments:         Anesthesia Quick Evaluation

## 2017-11-09 NOTE — Anesthesia Procedure Notes (Signed)
Procedure Name: MAC Performed by: Axel Meas, CRNA Pre-anesthesia Checklist: Patient identified, Emergency Drugs available, Suction available, Timeout performed and Patient being monitored Patient Re-evaluated:Patient Re-evaluated prior to induction Oxygen Delivery Method: Nasal cannula Placement Confirmation: positive ETCO2       

## 2017-11-09 NOTE — Anesthesia Postprocedure Evaluation (Signed)
Anesthesia Post Note  Patient: Elizabeth Henderson  Procedure(s) Performed: CATARACT EXTRACTION PHACO AND INTRAOCULAR LENS PLACEMENT (I[facility]) LEFT toric (Left )  Patient location during evaluation: PACU Anesthesia Type: MAC Level of consciousness: awake and alert, oriented and patient cooperative Pain management: pain level controlled Vital Signs Assessment: post-procedure vital signs reviewed and stable Respiratory status: spontaneous breathing, nonlabored ventilation and respiratory function stable Cardiovascular status: blood pressure returned to baseline and stable Postop Assessment: adequate PO intake Anesthetic complications: no    Darrin Nipper

## 2017-11-09 NOTE — H&P (Signed)
The History and Physical notes are on paper, have been signed, and are to be scanned. The patient remains stable and unchanged from the H&P.   Previous H&P reviewed, patient examined, and there are no changes.  Elizabeth Henderson 11/09/2017 9:58 AM

## 2017-11-09 NOTE — Op Note (Signed)
LOCATION:  [place]   PREOPERATIVE DIAGNOSIS:  Nuclear sclerotic cataract of the left eye.  H25.12  POSTOPERATIVE DIAGNOSIS:  Nuclear sclerotic cataract of the left eye.   PROCEDURE:  Phacoemulsification with Toric posterior chamber intraocular lens placement of the left eye.   LENS:  Implant Name Type Inv. Item Serial No. Manufacturer Lot No. LRB No. Used  LENS IOL TORIC 23.5 - [id number]  LENS IOL TORIC 23.5 [id number] ALCON  Left 1   [id number] Toric intraocular lens with 1.5 diopters of cylindrical power with axis orientation at 12 degrees.   ULTRASOUND TIME: 13 % of 1 minutes, 12 seconds.  CDE 9.5   SURGEON:  Wyonia Hough, MD   ANESTHESIA:  Topical with tetracaine drops and 2% Xylocaine jelly, augmented with 1% preservative-free intracameral lidocaine.  COMPLICATIONS:  None.   DESCRIPTION OF PROCEDURE:  The patient was identified in the holding room and transported to the operating suite and placed in the supine position under the operating microscope.  The left eye was identified as the operative eye, and it was prepped and draped in the usual sterile ophthalmic fashion.    A clear-corneal paracentesis incision was made at the 1:30 position.  0.5 ml of preservative-free 1% lidocaine was injected into the anterior chamber. The anterior chamber was filled with Viscoat.  A 2.4 millimeter near clear corneal incision was then made at the 10:30 position.  A cystotome and capsulorrhexis forceps were then used to make a curvilinear capsulorrhexis.  Hydrodissection and hydrodelineation were then performed using balanced salt solution.   Phacoemulsification was then used in stop and chop fashion to remove the lens, nucleus and epinucleus.  The remaining cortex was aspirated using the irrigation and aspiration handpiece.  Provisc viscoelastic was then placed into the capsular bag to distend it for lens placement.  The Verion digital marker was used to align the implant at  the intended axis.   A 23.5 diopter lens was then injected into the capsular bag.  It was rotated clockwise until the axis marks on the lens were approximately 15 degrees in the counterclockwise direction to the intended alignment.  The viscoelastic was aspirated from the eye using the irrigation aspiration handpiece.  Then, a Koch spatula through the sideport incision was used to rotate the lens in a clockwise direction until the axis markings of the intraocular lens were lined up with the Verion alignment.  Balanced salt solution was then used to hydrate the wounds. Cefuroxime 0.1 ml of a 10mg /ml solution was injected into the anterior chamber for a dose of 1 mg of intracameral antibiotic at the completion of the case.    The eye was noted to have a physiologic pressure and there was no wound leak noted.   Timolol and Brimonidine drops were applied to the eye.  The patient was taken to the recovery room in stable condition having had no complications of anesthesia or surgery.  Jerzey Komperda 11/09/2017, 11:01 AM

## 2017-12-28 ENCOUNTER — Other Ambulatory Visit: Payer: Self-pay | Admitting: Internal Medicine

## 2018-01-26 ENCOUNTER — Emergency Department
Admission: EM | Admit: 2018-01-26 | Discharge: 2018-01-26 | Disposition: A | Discharge status: Home / Self Care | Payer: Medicare Other | Attending: Emergency Medicine | Admitting: Emergency Medicine

## 2018-01-26 ENCOUNTER — Ambulatory Visit: Payer: Self-pay | Admitting: *Deleted

## 2018-01-26 ENCOUNTER — Ambulatory Visit: Payer: Medicare Other | Admitting: Family Medicine

## 2018-01-26 ENCOUNTER — Encounter: Payer: Self-pay | Admitting: Emergency Medicine

## 2018-01-26 DIAGNOSIS — I10 Essential (primary) hypertension: Secondary | ICD-10-CM | POA: Insufficient documentation

## 2018-01-26 DIAGNOSIS — K921 Melena: Secondary | ICD-10-CM

## 2018-01-26 DIAGNOSIS — Z79899 Other long term (current) drug therapy: Secondary | ICD-10-CM | POA: Insufficient documentation

## 2018-01-26 DIAGNOSIS — R197 Diarrhea, unspecified: Secondary | ICD-10-CM | POA: Diagnosis not present

## 2018-01-26 DIAGNOSIS — E039 Hypothyroidism, unspecified: Secondary | ICD-10-CM | POA: Insufficient documentation

## 2018-01-26 DIAGNOSIS — Z7982 Long term (current) use of aspirin: Secondary | ICD-10-CM | POA: Diagnosis not present

## 2018-01-26 DIAGNOSIS — Z96652 Presence of left artificial knee joint: Secondary | ICD-10-CM | POA: Diagnosis not present

## 2018-01-26 LAB — CBC
HEMATOCRIT: 40.6 % (ref 35.0–47.0)
HEMOGLOBIN: 13.7 g/dL (ref 12.0–16.0)
MCH: 31.7 pg (ref 26.0–34.0)
MCHC: 33.7 g/dL (ref 32.0–36.0)
MCV: 94.1 fL (ref 80.0–100.0)
Platelets: 205 10*3/uL (ref 150–440)
RBC: 4.31 MIL/uL (ref 3.80–5.20)
RDW: 12.8 % (ref 11.5–14.5)
WBC: 7 10*3/uL (ref 3.6–11.0)

## 2018-01-26 LAB — COMPREHENSIVE METABOLIC PANEL
ALT: 18 U/L (ref 14–54)
AST: 27 U/L (ref 15–41)
Albumin: 3.9 g/dL (ref 3.5–5.0)
Alkaline Phosphatase: 94 U/L (ref 38–126)
Anion gap: 11 (ref 5–15)
BUN: 18 mg/dL (ref 6–20)
CHLORIDE: 102 mmol/L (ref 101–111)
CO2: 21 mmol/L — AB (ref 22–32)
Calcium: 9.1 mg/dL (ref 8.9–10.3)
Creatinine, Ser: 1.34 mg/dL — ABNORMAL HIGH (ref 0.44–1.00)
GFR calc non Af Amer: 36 mL/min — ABNORMAL LOW (ref >60)
GFR, EST AFRICAN AMERICAN: 41 mL/min — AB (ref >60)
Glucose, Bld: 99 mg/dL (ref 65–99)
POTASSIUM: 4.1 mmol/L (ref 3.5–5.1)
Sodium: 134 mmol/L — ABNORMAL LOW (ref 135–145)
Total Bilirubin: 0.7 mg/dL (ref 0.3–1.2)
Total Protein: 7.9 g/dL (ref 6.5–8.1)

## 2018-01-26 LAB — TYPE AND SCREEN
ABO/RH(D): O POS
Antibody Screen: NEGATIVE

## 2018-01-26 NOTE — Telephone Encounter (Signed)
After further consideration I called patient back and informed her that due to having black stool that it would be better for her to go to the ED to have more of an extensive evaluation due to her symptoms. Patient states that she is ok with that and will go to the ED. Her appointment has been canceled with Gregary Signs.

## 2018-01-26 NOTE — ED Triage Notes (Signed)
Pt comes into the ED via POV c/o black stool on Tuesday.  Patient states she hasn't had as much of an appetite for the past couple of days since then.  Denies any h/o ulcers or GI bleeds.  Patient states she does not take an iron supplement.  Patient states she had two more bowel movements this morning and they were also black in color.  Denies any shortness of breath, excess fatigue, or dizziness.  Patient has even and unlabored respirations at this time.

## 2018-01-26 NOTE — Telephone Encounter (Signed)
Patient states she rarely has hard stool- she is regular- no constipation, no abdominal pain, no blood thinner- she does take ASA daily, no visible blood. Patient has unexplained diarrhea and it is black in color- which occult blood in stool needs to be ruled out.  Reason for Disposition . [1] Abnormal color is unexplained AND [2] persists > 24 hours  Answer Assessment - Initial Assessment Questions 1. COLOR: "What color is it?" "Is that color in part or all of the stool?"     Black stool- completely black 2. ONSET: "When was the unusual color first noted?"     Tuesday morning 3. CAUSE: "Have you eaten any food or taken any medicine of this color?" (See listing in BACKGROUND)     Diarrhea started Tuesday morning-  Patient started OTC medication Pepto bismol   4. OTHER SYMPTOMS: "Do you have any other symptoms?" (e.g., diarrhea, jaundice, abdominal pain, fever).     diarrhea  Protocols used: STOOLS - UNUSUAL COLOR-A-AH

## 2018-01-26 NOTE — Discharge Instructions (Signed)
Your blood counts today were normal and your stool tested negative for blood content.  Please follow up with Dr. Gustavo Lah for further evaluation of your black stools.

## 2018-01-26 NOTE — ED Provider Notes (Signed)
[facility] Emergency Department Provider Note  ____________________________________________  Time seen: Approximately 6:40 PM  I have reviewed the triage vital signs and the nursing notes.   HISTORY  Chief Complaint Rectal Bleeding    HPI Elizabeth Henderson is a 84 y.o. female with a history of GERD, large hiatal hernia, hypertension, diverticulosis who complains of frequent loose bowel movements and black stool for the past 3 days. She normally would have one loose bowel movement a day, normal color. 2 days ago, she started having multiple loose bowel movements a day, approximately 8-10 which appeared black. Yesterday she started taking Pepto-Bismol as well which did seem to improve the diarrhea. Today she has had 2 loose bowel movements. Denies any nausea or vomiting. No NSAID or steroid use. No abdominal pain. No dizziness. Denies any history of GI bleed or blood thinner use. Symptoms are intermittent, seemed to be worsened by eating, no alleviating factors.      Past Medical History:  Diagnosis Date  . Arthritis    lower back, hands  . Benign esophageal stricture   . Complication of anesthesia   . Diverticulosis   . Diverticulosis   . Dysphagia   . Family history of adverse reaction to anesthesia    brother had a hard time waking up with hernia surgery as a child.  Marland Kitchen GERD (gastroesophageal reflux disease)   . Headache(784.0)    h/o recurrent vascular headaches. no longer gets these  . Hypercholesterolemia   . Hypertension   . Hypothyroidism   . IBS (irritable bowel syndrome)   . Leg swelling   . Neck fullness 2016   dr. Nicki Reaper reviewed and found it to be nothing.  prinivil seemed to cause face and neck and tongue swelling  . PONV (postoperative nausea and vomiting)   . Transient global amnesia    x1, over 25 yrs ago after migraine  . Urinary tract bacterial infections   . Vertigo    x1 - several yrs ago.  OK after Epley maneuver     Patient  Active Problem List   Diagnosis Date Noted  . Status post left partial knee replacement 04/15/2016  . Left knee pain 12/29/2015  . Thyroid nodule 08/31/2015  . Dysphagia 07/20/2015  . Hypothyroidism 12/28/2014  . Health care maintenance 12/28/2014  . Right carotid bruit 06/20/2014  . Hyperglycemia 12/21/2013  . Back pain 12/21/2013  . Leg swelling 02/18/2013  . Hypercholesterolemia 08/11/2012  . Hypertension 08/11/2012  . IBS (irritable bowel syndrome) 08/11/2012  . GERD (gastroesophageal reflux disease) 08/11/2012     Past Surgical History:  Procedure Laterality Date  . ABDOMINAL HYSTERECTOMY    . APPENDECTOMY    . CATARACT EXTRACTION W/PHACO Right 10/12/2017   Procedure: CATARACT EXTRACTION PHACO AND INTRAOCULAR LENS PLACEMENT ([place]) RIGHT;  Surgeon: Leandrew Koyanagi, MD;  Location: [place];  Service: Ophthalmology;  Laterality: Right;  . CATARACT EXTRACTION W/PHACO Left 11/09/2017   Procedure: CATARACT EXTRACTION PHACO AND INTRAOCULAR LENS PLACEMENT ([place]) LEFT toric;  Surgeon: Leandrew Koyanagi, MD;  Location: [place];  Service: Ophthalmology;  Laterality: Left;  . CHOLECYSTECTOMY  1990  . CYSTOSCOPY     x2  . ESOPHAGOGASTRODUODENOSCOPY (EGD) WITH PROPOFOL N/A 08/28/2015   Procedure: ESOPHAGOGASTRODUODENOSCOPY (EGD) WITH PROPOFOL;  Surgeon: Hulen Luster, MD;  Location: [facility] ENDOSCOPY;  Service: Gastroenterology;  Laterality: N/A;  . ESOPHAGOGASTRODUODENOSCOPY (EGD) WITH PROPOFOL N/A 04/04/2017   Procedure: ESOPHAGOGASTRODUODENOSCOPY (EGD) WITH PROPOFOL;  Surgeon: Lollie Sails, MD;  Location: [facility] ENDOSCOPY;  Service: Endoscopy;  Laterality: N/A;  . PARTIAL HYSTERECTOMY    . PARTIAL KNEE ARTHROPLASTY Left 04/15/2016   Procedure: UNICOMPARTMENTAL KNEE;  Surgeon: Corky Mull, MD;  Location: [facility] ORS;  Service: Orthopedics;  Laterality: Left;  . TONSILLECTOMY       Prior to Admission medications   Medication Sig Start Date End Date Taking?  Authorizing Provider  amitriptyline (ELAVIL) 25 MG tablet Take 25 mg by mouth at bedtime.    [provider]  amLODipine (NORVASC) 10 MG tablet TAKE 1 TABLET DAILY 10/10/17   Einar Pheasant, MD  aspirin 81 MG tablet Take 81 mg by mouth daily.    [provider]  atorvastatin (LIPITOR) 40 MG tablet Take 1 tablet (40 mg total) by mouth daily. 01/28/17   Einar Pheasant, MD  Calcium Carbonate-Vitamin D (CALCIUM 600+D) 600-400 MG-UNIT tablet Take 1 tablet by mouth daily.    [provider]  Cholecalciferol (VITAMIN D3) 1000 units CAPS Take by mouth.    [provider]  ciclopirox (PENLAC) 8 % solution Apply topically at bedtime. Apply over nail and surrounding skin. Apply daily over previous coat. After seven (7) days, may remove with alcohol and continue cycle. Patient not taking: Reported on 11/09/2017 03/11/17   Einar Pheasant, MD  estrogens, conjugated, (PREMARIN) 0.9 MG tablet Take 0.9 mg by mouth daily.     [provider]  levothyroxine (SYNTHROID) 50 MCG tablet Take 1 tablet (50 mcg total) by mouth daily before breakfast. 01/28/17   Einar Pheasant, MD  metoprolol succinate (TOPROL-XL) 50 MG 24 hr tablet TAKE 1 TABLET TWICE A DAY  WITH OR IMMEDIATELY        FOLLOWING A MEAL 12/29/17   Einar Pheasant, MD  pantoprazole (PROTONIX) 40 MG tablet TAKE 1 TABLET DAILY 10/31/17   Einar Pheasant, MD     Allergies Ace inhibitors; Prinivil [lisinopril]; Floxin [ofloxacin]; Mercury; Sulfa antibiotics; and Vioxx [rofecoxib]   Family History  Problem Relation Age of Onset  . Heart disease Father   . Heart disease Mother   . Heart disease Brother   . Heart disease Brother   . Breast cancer Neg Hx   . Colon cancer Neg Hx     Social History Social History   Tobacco Use  . Smoking status: Never Smoker  . Smokeless tobacco: Never Used  Substance Use Topics  . Alcohol use: No    Alcohol/week: 0.0 oz    Comment: seldom, a glass of wine 1-3 per year.  .  Drug use: No    Review of Systems  Constitutional:   No fever or chills.  ENT:   No sore throat. No rhinorrhea. Cardiovascular:   No chest pain or syncope. Respiratory:   No dyspnea or cough. Gastrointestinal:   Negative for abdominal pain or vomiting. Positive diarrhea.  Musculoskeletal:   Negative for focal pain or swelling All other systems reviewed and are negative except as documented above in ROS and HPI.  ____________________________________________   PHYSICAL EXAM:  VITAL SIGNS: ED Triage Vitals  Enc Vitals Group     BP 01/26/18 1433 (!) 176/75     Pulse Rate 01/26/18 1433 76     Resp 01/26/18 1433 16     Temp 01/26/18 1433 98 F (36.7 C)     Temp Source 01/26/18 1433 Oral     SpO2 01/26/18 1433 99 %     Weight 01/26/18 1434 154 lb (69.9 kg)     Height 01/26/18 1434 5\' 2"  (1.575 m)  Head Circumference --      Peak Flow --      Pain Score 01/26/18 1434 0     Pain Loc --      Pain Edu? --      Excl. in [place]? --     Vital signs reviewed, nursing assessments reviewed.   Constitutional:   Alert and oriented. Well appearing and in no distress. Eyes:   Conjunctivae are normal. EOMI. PERRL. ENT      Head:   Normocephalic and atraumatic.      Nose:   No congestion/rhinnorhea.       Mouth/Throat:   MMM, no pharyngeal erythema. No peritonsillar mass.       Neck:   No meningismus. Full ROM. Hematological/Lymphatic/Immunilogical:   No cervical lymphadenopathy. Cardiovascular:   RRR. Symmetric bilateral radial and DP pulses.  No murmurs.  Respiratory:   Normal respiratory effort without tachypnea/retractions. Breath sounds are clear and equal bilaterally. No wheezes/rales/rhonchi. Gastrointestinal:   Soft and nontender. Non distended. There is no CVA tenderness.  No rebound, rigidity, or guarding.rectal exam performed with nurse [place] at bedside. There is small external hemorrhoids that are nonthrombosed inflamed or bleeding. Rectal vault is empty and secretions are  Hemoccult-negative, controls are okay.  Musculoskeletal:   Normal range of motion in all extremities. No joint effusions.  No lower extremity tenderness.  No edema. Neurologic:   Normal speech and language.  Motor grossly intact. No acute focal neurologic deficits are appreciated.  Skin:    Skin is warm, dry and intact. No rash noted.  No petechiae, purpura, or bullae.  ____________________________________________    LABS (pertinent positives/negatives) (all labs ordered are listed, but only abnormal results are displayed) Labs Reviewed  COMPREHENSIVE METABOLIC PANEL - Abnormal; Notable for the following components:      Result Value   Sodium 134 (*)    CO2 21 (*)    Creatinine, Ser 1.34 (*)    GFR calc non Af Amer 36 (*)    GFR calc Af Amer 41 (*)    All other components within normal limits  CBC  POC OCCULT BLOOD, ED  TYPE AND SCREEN   ____________________________________________   EKG    ____________________________________________    RADIOLOGY  No results found.  ____________________________________________   PROCEDURES Procedures  ____________________________________________    CLINICAL IMPRESSION / ASSESSMENT AND PLAN / ED COURSE  Pertinent labs & imaging results that were available during my care of the patient were reviewed by me and considered in my medical decision making (see chart for details).    patient well-appearing no acute distress, unremarkable vital signs except for hypertension. Presents with loose bowel movements and black stool. Concern would be for GI bleed, but her vital signs, blood counts, and Hemoccult are all reassuring. I doubt mesenteric ischemia, AAA, dissection, bowel perforation or obstruction, pancreatitis. She has seen Dr. Gustavo Lah in the past, had an EGD within the last 1 or 2 years which was unremarkable. Recommend that she follow up with gastrology again for further evaluation. Return precautions discussed.       ____________________________________________   FINAL CLINICAL IMPRESSION(S) / ED DIAGNOSES    Final diagnoses:  Diarrhea, unspecified type  Black stool     ED Discharge Orders    None      Portions of this note were generated with dragon dictation software. Dictation errors may occur despite best attempts at proofreading.    Carrie Mew, MD 01/26/18 1843

## 2018-01-30 DIAGNOSIS — K921 Melena: Secondary | ICD-10-CM | POA: Diagnosis not present

## 2018-02-06 ENCOUNTER — Other Ambulatory Visit (INDEPENDENT_AMBULATORY_CARE_PROVIDER_SITE_OTHER): Payer: Medicare Other

## 2018-02-06 DIAGNOSIS — I10 Essential (primary) hypertension: Secondary | ICD-10-CM

## 2018-02-06 DIAGNOSIS — R739 Hyperglycemia, unspecified: Secondary | ICD-10-CM | POA: Diagnosis not present

## 2018-02-06 DIAGNOSIS — E039 Hypothyroidism, unspecified: Secondary | ICD-10-CM

## 2018-02-06 DIAGNOSIS — E7800 Pure hypercholesterolemia, unspecified: Secondary | ICD-10-CM

## 2018-02-06 LAB — LIPID PANEL
CHOLESTEROL: 139 mg/dL (ref 0–200)
HDL: 68.3 mg/dL (ref >39.00)
LDL Cholesterol: 40 mg/dL (ref 0–99)
NonHDL: 71.19
Total CHOL/HDL Ratio: 2
Triglycerides: 155 mg/dL — ABNORMAL HIGH (ref 0.0–149.0)
VLDL: 31 mg/dL (ref 0.0–40.0)

## 2018-02-06 LAB — CBC WITH DIFFERENTIAL/PLATELET
BASOS PCT: 1.8 % (ref 0.0–3.0)
Basophils Absolute: 0.1 10*3/uL (ref 0.0–0.1)
EOS ABS: 0.3 10*3/uL (ref 0.0–0.7)
EOS PCT: 3.9 % (ref 0.0–5.0)
HEMATOCRIT: 37.6 % (ref 36.0–46.0)
Hemoglobin: 12.8 g/dL (ref 12.0–15.0)
LYMPHS ABS: 1.4 10*3/uL (ref 0.7–4.0)
Lymphocytes Relative: 21.3 % (ref 12.0–46.0)
MCHC: 34 g/dL (ref 30.0–36.0)
MCV: 92.9 fl (ref 78.0–100.0)
Monocytes Absolute: 0.5 10*3/uL (ref 0.1–1.0)
Monocytes Relative: 7.4 % (ref 3.0–12.0)
NEUTROS ABS: 4.3 10*3/uL (ref 1.4–7.7)
NEUTROS PCT: 65.6 % (ref 43.0–77.0)
PLATELETS: 196 10*3/uL (ref 150.0–400.0)
RBC: 4.05 Mil/uL (ref 3.87–5.11)
RDW: 12.7 % (ref 11.5–15.5)
WBC: 6.6 10*3/uL (ref 4.0–10.5)

## 2018-02-06 LAB — HEMOGLOBIN A1C: HEMOGLOBIN A1C: 5.6 % (ref 4.6–6.5)

## 2018-02-06 LAB — HEPATIC FUNCTION PANEL
ALBUMIN: 3.7 g/dL (ref 3.5–5.2)
ALK PHOS: 71 U/L (ref 39–117)
ALT: 9 U/L (ref 0–35)
AST: 13 U/L (ref 0–37)
BILIRUBIN TOTAL: 0.5 mg/dL (ref 0.2–1.2)
Bilirubin, Direct: 0.1 mg/dL (ref 0.0–0.3)
Total Protein: 7 g/dL (ref 6.0–8.3)

## 2018-02-06 LAB — BASIC METABOLIC PANEL
BUN: 19 mg/dL (ref 6–23)
CALCIUM: 9 mg/dL (ref 8.4–10.5)
CO2: 26 mEq/L (ref 19–32)
Chloride: 103 mEq/L (ref 96–112)
Creatinine, Ser: 1.28 mg/dL — ABNORMAL HIGH (ref 0.40–1.20)
GFR: 42.27 mL/min — AB (ref >60.00)
GLUCOSE: 102 mg/dL — AB (ref 70–99)
Potassium: 4.4 mEq/L (ref 3.5–5.1)
Sodium: 138 mEq/L (ref 135–145)

## 2018-02-06 LAB — TSH: TSH: 3.68 u[IU]/mL (ref 0.35–4.50)

## 2018-02-08 ENCOUNTER — Encounter: Payer: Self-pay | Admitting: Internal Medicine

## 2018-02-08 ENCOUNTER — Ambulatory Visit (INDEPENDENT_AMBULATORY_CARE_PROVIDER_SITE_OTHER): Payer: Medicare Other | Admitting: Internal Medicine

## 2018-02-08 DIAGNOSIS — E7800 Pure hypercholesterolemia, unspecified: Secondary | ICD-10-CM | POA: Diagnosis not present

## 2018-02-08 DIAGNOSIS — E041 Nontoxic single thyroid nodule: Secondary | ICD-10-CM | POA: Diagnosis not present

## 2018-02-08 DIAGNOSIS — K219 Gastro-esophageal reflux disease without esophagitis: Secondary | ICD-10-CM

## 2018-02-08 DIAGNOSIS — I10 Essential (primary) hypertension: Secondary | ICD-10-CM

## 2018-02-08 DIAGNOSIS — R739 Hyperglycemia, unspecified: Secondary | ICD-10-CM

## 2018-02-08 DIAGNOSIS — K921 Melena: Secondary | ICD-10-CM

## 2018-02-08 DIAGNOSIS — E039 Hypothyroidism, unspecified: Secondary | ICD-10-CM

## 2018-02-08 NOTE — Progress Notes (Signed)
[name], female   DOB: [birth year], 84 y.o.   MRN: [id number]   Subjective:    [name], female    DOB: 1932/04/25, 84 y.o.   MRN: [id number]  HPI  Patient here for a scheduled follow up.  She was seen in ER recently with black watery stool.  hgb stable.  Diarrhea resolved. Saw GI this week.  Felt stable and did not feel further w/up warranted.   She reports she has had no further diarrhea.  No black stools.  No abdominal pain.  Bowels moving.  GI note reviewed.  No chest pain.  Tries to stay active.  No sob.  No acid reflux.  No nausea or vomiting.  Overall she feels she is doing well.     Past Medical History:  Diagnosis Date  . Arthritis    lower back, hands  . Benign esophageal stricture   . Complication of anesthesia   . Diverticulosis   . Diverticulosis   . Dysphagia   . Family history of adverse reaction to anesthesia    brother had a hard time waking up with hernia surgery as a child.  Marland Kitchen GERD (gastroesophageal reflux disease)   . Headache(784.0)    h/o recurrent vascular headaches. no longer gets these  . Hypercholesterolemia   . Hypertension   . Hypothyroidism   . IBS (irritable bowel syndrome)   . Leg swelling   . Neck fullness 2016   dr. Nicki Reaper reviewed and found it to be nothing.  prinivil seemed to cause face and neck and tongue swelling  . PONV (postoperative nausea and vomiting)   . Transient global amnesia    x1, over 25 yrs ago after migraine  . Urinary tract bacterial infections   . Vertigo    x1 - several yrs ago.  OK after Epley maneuver   Past Surgical History:  Procedure Laterality Date  . ABDOMINAL HYSTERECTOMY    . APPENDECTOMY    . CATARACT EXTRACTION W/PHACO Right 10/12/2017   Procedure: CATARACT EXTRACTION PHACO AND INTRAOCULAR LENS PLACEMENT ([place]) RIGHT;  Surgeon: Leandrew Koyanagi, MD;  Location: [place];  Service: Ophthalmology;  Laterality: Right;  . CATARACT EXTRACTION W/PHACO Left 11/09/2017     Procedure: CATARACT EXTRACTION PHACO AND INTRAOCULAR LENS PLACEMENT ([place]) LEFT toric;  Surgeon: Leandrew Koyanagi, MD;  Location: [place];  Service: Ophthalmology;  Laterality: Left;  . CHOLECYSTECTOMY  1990  . CYSTOSCOPY     x2  . ESOPHAGOGASTRODUODENOSCOPY (EGD) WITH PROPOFOL N/A 08/28/2015   Procedure: ESOPHAGOGASTRODUODENOSCOPY (EGD) WITH PROPOFOL;  Surgeon: Hulen Luster, MD;  [facility] END[facility]COPY;  Service: Gastroenterology;  Laterality: N/A;  . ESOPHAGOGASTRODUODENOSCOPY (EGD) WITH PROPOFOL N/A 04/04/2017   Procedure: ESOPHAGOGASTRODUODENOSCOPY (EGD) WITH PROPOFOL;  Surgeon: Lollie Sails, MD;  Location: [facility] ENDOSCOPY;  Service: Endoscopy;  Laterality: N/A;  . PARTIAL HYSTERECTOMY    . PARTIAL KNEE ARTHROPLASTY Left 04/15/2016   Procedure: UNICOMPARTMENTAL KNEE;  Surgeon: Corky Mull, MD;  Location: [facility] ORS;  Service: Orthopedics;  Laterality: Left;  . TONSILLECTOMY     Family History  Problem Relation Age of Onset  . Heart disease Father   . Heart disease Mother   . Heart disease Brother   . Heart disease Brother   . Breast cancer Neg Hx   . Colon cancer Neg Hx    Social History   Socioeconomic History  . Marital status: Married    Spouse name: Not on file  . Number of  children: 2  . Years of education: Not on file  . Highest education level: Not on file  Occupational History  . Occupation: retired  Scientific laboratory technician  . Financial resource strain: Not on file  . Food insecurity:    Worry: Not on file    Inability: Not on file  . Transportation needs:    Medical: Not on file    Non-medical: Not on file  Tobacco Use  . Smoking status: Never Smoker  . Smokeless tobacco: Never Used  Substance and Sexual Activity  . Alcohol use: No    Alcohol/week: 0.0 oz    Comment: seldom, a glass of wine 1-3 per year.  . Drug use: No  . Sexual activity: Not Currently  Lifestyle  . Physical activity:    Days per week: Not on file    Minutes per session: Not on  file  . Stress: Not on file  Relationships  . Social connections:    Talks on phone: Not on file    Gets together: Not on file    Attends religious service: Not on file    Active member of club or organization: Not on file    Attends meetings of clubs or organizations: Not on file    Relationship status: Not on file  Other Topics Concern  . Not on file  Social History Narrative  . Not on file    Outpatient Encounter Medications as of 02/08/2018  Medication Sig  . amitriptyline (ELAVIL) 25 MG tablet Take 25 mg by mouth at bedtime.  Marland Kitchen amLODipine (NORVASC) 10 MG tablet TAKE 1 TABLET DAILY  . aspirin 81 MG tablet Take 81 mg by mouth daily.  Marland Kitchen atorvastatin (LIPITOR) 40 MG tablet Take 1 tablet (40 mg total) by mouth daily.  . Calcium Carbonate-Vitamin D (CALCIUM 600+D) 600-400 MG-UNIT tablet Take 1 tablet by mouth daily.  . Cholecalciferol (VITAMIN D3) 1000 units CAPS Take by mouth.  . ciclopirox (PENLAC) 8 % solution Apply topically at bedtime. Apply over nail and surrounding skin. Apply daily over previous coat. After seven (7) days, may remove with alcohol and continue cycle. (Patient not taking: Reported on 11/09/2017)  . estrogens, conjugated, (PREMARIN) 0.9 MG tablet Take 0.9 mg by mouth daily.   Marland Kitchen levothyroxine (SYNTHROID) 50 MCG tablet Take 1 tablet (50 mcg total) by mouth daily before breakfast.  . metoprolol succinate (TOPROL-XL) 50 MG 24 hr tablet TAKE 1 TABLET TWICE A DAY  WITH OR IMMEDIATELY        FOLLOWING A MEAL  . pantoprazole (PROTONIX) 40 MG tablet TAKE 1 TABLET DAILY   No facility-administered encounter medications on file as of 02/08/2018.     Review of Systems  Constitutional: Negative for appetite change and unexpected weight change.  HENT: Negative for congestion and sinus pressure.   Respiratory: Negative for cough, chest tightness and shortness of breath.   Cardiovascular: Negative for chest pain and palpitations.  Gastrointestinal: Negative for abdominal pain,  nausea and vomiting.       Previous diarrhea and black stools resolved.    Genitourinary: Negative for difficulty urinating and dysuria.  Musculoskeletal: Negative for joint swelling and myalgias.  Skin: Negative for color change and rash.  Neurological: Negative for dizziness, light-headedness and headaches.  Psychiatric/Behavioral: Negative for agitation and dysphoric mood.       Objective:    Physical Exam  Constitutional: She appears well-developed and well-nourished. No distress.  HENT:  Nose: Nose normal.  Mouth/Throat: Oropharynx is clear and moist.  Neck: Neck supple. No thyromegaly present.  Cardiovascular: Normal rate and regular rhythm.  Pulmonary/Chest: Breath sounds normal. No respiratory distress. She has no wheezes.  Abdominal: Soft. Bowel sounds are normal. There is no tenderness.  Musculoskeletal: She exhibits no edema or tenderness.  Lymphadenopathy:    She has no cervical adenopathy.  Skin: No rash noted. No erythema.  Psychiatric: She has a normal mood and affect. Her behavior is normal.    BP 124/70 (BP Location: Left Arm, Patient Position: Sitting, Cuff Size: Normal)   Pulse 64   Temp 98.3 F (36.8 C) (Oral)   Resp 18   Wt 158 lb 12.8 oz (72 kg)   SpO2 97%   BMI 29.04 kg/m  Wt Readings from Last 3 Encounters:  02/08/18 158 lb 12.8 oz (72 kg)  01/26/18 154 lb (69.9 kg)  11/09/17 155 lb (70.3 kg)     Lab Results  Component Value Date   WBC 6.6 02/06/2018   HGB 12.8 02/06/2018   HCT 37.6 02/06/2018   PLT 196.0 02/06/2018   GLUCOSE 102 (H) 02/06/2018   CHOL 139 02/06/2018   TRIG 155.0 (H) 02/06/2018   HDL 68.30 02/06/2018   LDLDIRECT 64.9 12/08/2012   LDLCALC 40 02/06/2018   ALT 9 02/06/2018   AST 13 02/06/2018   NA 138 02/06/2018   K 4.4 02/06/2018   CL 103 02/06/2018   CREATININE 1.28 (H) 02/06/2018   BUN 19 02/06/2018   CO2 26 02/06/2018   TSH 3.68 02/06/2018   INR 1.03 04/07/2016   HGBA1C 5.6 02/06/2018       Assessment &  Plan:   Problem List Items Addressed This Visit    Black stools    Was seen in ER recently with black stool.  hgb wnl.  Resolved.  No diarrhea or black stools now.  No abdominal pain.  Saw GI.  Note reviewed.  Felt no further w/up warranted.  No upper symptoms.  Follow.       GERD (gastroesophageal reflux disease)    No upper symptoms.  Controlled on protonix.  Just saw GI. Felt no further w/up warranted.        Hypercholesterolemia    On lipitor.  Low cholesterol diet and exercise.  Follow lipid panel and liver function tests.  Discussed labs.   Lab Results  Component Value Date   CHOL 139 02/06/2018   HDL 68.30 02/06/2018   LDLCALC 40 02/06/2018   LDLDIRECT 64.9 12/08/2012   TRIG 155.0 (H) 02/06/2018   CHOLHDL 2 02/06/2018        Relevant Orders   Hepatic function panel   Lipid panel   Hyperglycemia    Low carb diet and exercise.  Follow met b and a1c.        Relevant Orders   Hemoglobin A1c   Hypertension    Blood pressure under good control.  Continue same medication regimen.  Follow pressures.  Follow metabolic panel.        Relevant Orders   Basic metabolic panel   Hypothyroidism    On thyroid replacement.  Follow tsh.        Thyroid nodule    Thyroid ultrasound as outlined.  Stable.  Recommended f/u in one year - from 06/22/17 test.            Einar Pheasant, MD

## 2018-02-11 ENCOUNTER — Encounter: Payer: Self-pay | Admitting: Internal Medicine

## 2018-02-11 DIAGNOSIS — K921 Melena: Secondary | ICD-10-CM | POA: Insufficient documentation

## 2018-02-11 DIAGNOSIS — R195 Other fecal abnormalities: Secondary | ICD-10-CM | POA: Insufficient documentation

## 2018-02-11 NOTE — Assessment & Plan Note (Signed)
Thyroid ultrasound as outlined.  Stable.  Recommended f/u in one year - from 06/22/17 test.

## 2018-02-11 NOTE — Assessment & Plan Note (Signed)
No upper symptoms.  Controlled on protonix.  Just saw GI. Felt no further w/up warranted.

## 2018-02-11 NOTE — Assessment & Plan Note (Signed)
Blood pressure under good control.  Continue same medication regimen.  Follow pressures.  Follow metabolic panel.   

## 2018-02-11 NOTE — Assessment & Plan Note (Signed)
Was seen in ER recently with black stool.  hgb wnl.  Resolved.  No diarrhea or black stools now.  No abdominal pain.  Saw GI.  Note reviewed.  Felt no further w/up warranted.  No upper symptoms.  Follow.

## 2018-02-11 NOTE — Assessment & Plan Note (Signed)
Low carb diet and exercise.  Follow met b and a1c.   

## 2018-02-11 NOTE — Assessment & Plan Note (Signed)
On lipitor.  Low cholesterol diet and exercise.  Follow lipid panel and liver function tests.  Discussed labs.   Lab Results  Component Value Date   CHOL 139 02/06/2018   HDL 68.30 02/06/2018   LDLCALC 40 02/06/2018   LDLDIRECT 64.9 12/08/2012   TRIG 155.0 (H) 02/06/2018   CHOLHDL 2 02/06/2018

## 2018-02-11 NOTE — Assessment & Plan Note (Signed)
On thyroid replacement.  Follow tsh.  

## 2018-04-07 ENCOUNTER — Other Ambulatory Visit: Payer: Self-pay | Admitting: Internal Medicine

## 2018-05-17 DIAGNOSIS — L708 Other acne: Secondary | ICD-10-CM | POA: Diagnosis not present

## 2018-06-20 DIAGNOSIS — H43811 Vitreous degeneration, right eye: Secondary | ICD-10-CM | POA: Diagnosis not present

## 2018-06-26 ENCOUNTER — Ambulatory Visit (INDEPENDENT_AMBULATORY_CARE_PROVIDER_SITE_OTHER): Payer: Medicare Other

## 2018-06-26 DIAGNOSIS — Z23 Encounter for immunization: Secondary | ICD-10-CM

## 2018-07-22 ENCOUNTER — Other Ambulatory Visit: Payer: Self-pay | Admitting: Internal Medicine

## 2018-08-22 ENCOUNTER — Other Ambulatory Visit (INDEPENDENT_AMBULATORY_CARE_PROVIDER_SITE_OTHER): Payer: Medicare Other

## 2018-08-22 DIAGNOSIS — I10 Essential (primary) hypertension: Secondary | ICD-10-CM | POA: Diagnosis not present

## 2018-08-22 DIAGNOSIS — R739 Hyperglycemia, unspecified: Secondary | ICD-10-CM

## 2018-08-22 DIAGNOSIS — E7800 Pure hypercholesterolemia, unspecified: Secondary | ICD-10-CM

## 2018-08-22 LAB — HEPATIC FUNCTION PANEL
ALT: 12 U/L (ref 0–35)
AST: 16 U/L (ref 0–37)
Albumin: 3.8 g/dL (ref 3.5–5.2)
Alkaline Phosphatase: 78 U/L (ref 39–117)
BILIRUBIN DIRECT: 0.1 mg/dL (ref 0.0–0.3)
BILIRUBIN TOTAL: 0.5 mg/dL (ref 0.2–1.2)
Total Protein: 7 g/dL (ref 6.0–8.3)

## 2018-08-22 LAB — BASIC METABOLIC PANEL
BUN: 20 mg/dL (ref 6–23)
CO2: 24 mEq/L (ref 19–32)
CREATININE: 1.23 mg/dL — AB (ref 0.40–1.20)
Calcium: 8.9 mg/dL (ref 8.4–10.5)
Chloride: 105 mEq/L (ref 96–112)
GFR: 44.2 mL/min — AB (ref >60.00)
GLUCOSE: 106 mg/dL — AB (ref 70–99)
POTASSIUM: 3.9 meq/L (ref 3.5–5.1)
Sodium: 138 mEq/L (ref 135–145)

## 2018-08-22 LAB — LIPID PANEL
CHOLESTEROL: 131 mg/dL (ref 0–200)
HDL: 62.6 mg/dL (ref >39.00)
LDL CALC: 38 mg/dL (ref 0–99)
NONHDL: 68.49
Total CHOL/HDL Ratio: 2
Triglycerides: 153 mg/dL — ABNORMAL HIGH (ref 0.0–149.0)
VLDL: 30.6 mg/dL (ref 0.0–40.0)

## 2018-08-22 LAB — HEMOGLOBIN A1C: HEMOGLOBIN A1C: 5.6 % (ref 4.6–6.5)

## 2018-08-24 ENCOUNTER — Ambulatory Visit (INDEPENDENT_AMBULATORY_CARE_PROVIDER_SITE_OTHER): Payer: Medicare Other | Admitting: Internal Medicine

## 2018-08-24 ENCOUNTER — Encounter: Payer: Self-pay | Admitting: Internal Medicine

## 2018-08-24 DIAGNOSIS — E039 Hypothyroidism, unspecified: Secondary | ICD-10-CM

## 2018-08-24 DIAGNOSIS — R739 Hyperglycemia, unspecified: Secondary | ICD-10-CM | POA: Diagnosis not present

## 2018-08-24 DIAGNOSIS — K219 Gastro-esophageal reflux disease without esophagitis: Secondary | ICD-10-CM | POA: Diagnosis not present

## 2018-08-24 DIAGNOSIS — M5442 Lumbago with sciatica, left side: Secondary | ICD-10-CM | POA: Diagnosis not present

## 2018-08-24 DIAGNOSIS — I10 Essential (primary) hypertension: Secondary | ICD-10-CM | POA: Diagnosis not present

## 2018-08-24 DIAGNOSIS — E041 Nontoxic single thyroid nodule: Secondary | ICD-10-CM

## 2018-08-24 DIAGNOSIS — E7800 Pure hypercholesterolemia, unspecified: Secondary | ICD-10-CM

## 2018-08-24 NOTE — Progress Notes (Signed)
Patient ID: SHARRYN BELDING, female   DOB: [birth year], 84 y.o.   MRN: [id number]   Subjective:    Patient ID: Lum Babe, female    DOB: [birth year], 84 y.o.   MRN: [id number]  HPI  Patient here for a scheduled follow up.  She reports she is doing relatively well.  Saw GI recently for ER follow.  Was evaluated for black watery stool.   hgb stable.  Felt no further w/up warranted.  No further bleeding.  No chest pain.  No sob.  No acid reflux.  No abdominal pain.  Still having low back pain with pain down left leg.  Persistent.  Has had xray previously.  Revealed degenerative changes.  Unable to take antiinflammatories secondary to some renal insufficiency.  Feels limits her activity.  Discussed referral to Dr Sharlet Salina.  Some swelling in lower extremities.  Keeps legs elevated.     Past Medical History:  Diagnosis Date  . Arthritis    lower back, hands  . Benign esophageal stricture   . Complication of anesthesia   . Diverticulosis   . Diverticulosis   . Dysphagia   . Family history of adverse reaction to anesthesia    brother had a hard time waking up with hernia surgery as a child.  Marland Kitchen GERD (gastroesophageal reflux disease)   . Headache(784.0)    h/o recurrent vascular headaches. no longer gets these  . Hypercholesterolemia   . Hypertension   . Hypothyroidism   . IBS (irritable bowel syndrome)   . Leg swelling   . Neck fullness 2016   dr. Nicki Reaper reviewed and found it to be nothing.  prinivil seemed to cause face and neck and tongue swelling  . PONV (postoperative nausea and vomiting)   . Transient global amnesia    x1, over 25 yrs ago after migraine  . Urinary tract bacterial infections   . Vertigo    x1 - several yrs ago.  OK after Epley maneuver   Past Surgical History:  Procedure Laterality Date  . ABDOMINAL HYSTERECTOMY    . APPENDECTOMY    . CATARACT EXTRACTION W/PHACO Right 10/12/2017   Procedure: CATARACT EXTRACTION PHACO AND INTRAOCULAR LENS PLACEMENT ([place]) RIGHT;   Surgeon: Leandrew Koyanagi, MD;  Location: [place];  Service: Ophthalmology;  Laterality: Right;  . CATARACT EXTRACTION W/PHACO Left 11/09/2017   Procedure: CATARACT EXTRACTION PHACO AND INTRAOCULAR LENS PLACEMENT ([place]) LEFT toric;  Surgeon: Leandrew Koyanagi, MD;  Location: [place];  Service: Ophthalmology;  Laterality: Left;  . CHOLECYSTECTOMY  1990  . CYSTOSCOPY     x2  . ESOPHAGOGASTRODUODENOSCOPY (EGD) WITH PROPOFOL N/A 08/28/2015   Procedure: ESOPHAGOGASTRODUODENOSCOPY (EGD) WITH PROPOFOL;  Surgeon: Hulen Luster, MD;  Location: [facility] ENDOSCOPY;  Service: Gastroenterology;  Laterality: N/A;  . ESOPHAGOGASTRODUODENOSCOPY (EGD) WITH PROPOFOL N/A 04/04/2017   Procedure: ESOPHAGOGASTRODUODENOSCOPY (EGD) WITH PROPOFOL;  Surgeon: Lollie Sails, MD;  Location: [facility] ENDOSCOPY;  Service: Endoscopy;  Laterality: N/A;  . PARTIAL HYSTERECTOMY    . PARTIAL KNEE ARTHROPLASTY Left 04/15/2016   Procedure: UNICOMPARTMENTAL KNEE;  Surgeon: Corky Mull, MD;  Location: [facility];  Service: Orthopedics;  Laterality: Left;  . TONSILLECTOMY     Family History  Problem Relation Age of Onset  . Heart disease Father   . Heart disease Mother   . Heart disease Brother   . Heart disease Brother   . Breast cancer Neg Hx   . Colon cancer Neg Hx    Social History   Socioeconomic  History  . Marital status: Married    Spouse name: Not on file  . Number of children: 2  . Years of education: Not on file  . Highest education level: Not on file  Occupational History  . Occupation: retired  Scientific laboratory technician  . Financial resource strain: Not on file  . Food insecurity:    Worry: Not on file    Inability: Not on file  . Transportation needs:    Medical: Not on file    Non-medical: Not on file  Tobacco Use  . Smoking status: Never Smoker  . Smokeless tobacco: Never Used  Substance and Sexual Activity  . Alcohol use: No    Alcohol/week: 0.0 standard drinks    Comment: seldom, a glass  of wine 1-3 per year.  . Drug use: No  . Sexual activity: Not Currently  Lifestyle  . Physical activity:    Days per week: Not on file    Minutes per session: Not on file  . Stress: Not on file  Relationships  . Social connections:    Talks on phone: Not on file    Gets together: Not on file    Attends religious service: Not on file    Active member of club or organization: Not on file    Attends meetings of clubs or organizations: Not on file    Relationship status: Not on file  Other Topics Concern  . Not on file  Social History Narrative  . Not on file    Outpatient Encounter Medications as of 08/24/2018  Medication Sig  . amitriptyline (ELAVIL) 25 MG tablet Take 25 mg by mouth at bedtime.  Marland Kitchen amLODipine (NORVASC) 10 MG tablet TAKE 1 TABLET DAILY  . aspirin 81 MG tablet Take 81 mg by mouth daily.  Marland Kitchen atorvastatin (LIPITOR) 40 MG tablet TAKE 1 TABLET DAILY  . Calcium Carbonate-Vitamin D (CALCIUM 600+D) 600-400 MG-UNIT tablet Take 1 tablet by mouth daily.  . Cholecalciferol (VITAMIN D3) 1000 units CAPS Take by mouth.  . ciclopirox (PENLAC) 8 % solution Apply topically at bedtime. Apply over nail and surrounding skin. Apply daily over previous coat. After seven (7) days, may remove with alcohol and continue cycle. (Patient not taking: Reported on 11/09/2017)  . estrogens, conjugated, (PREMARIN) 0.9 MG tablet Take 0.9 mg by mouth daily.   Marland Kitchen levothyroxine (SYNTHROID) 50 MCG tablet Take 1 tablet (50 mcg total) by mouth daily before breakfast.  . metoprolol succinate (TOPROL-XL) 50 MG 24 hr tablet TAKE 1 TABLET TWICE A DAY  WITH OR IMMEDIATELY        FOLLOWING A MEAL  . pantoprazole (PROTONIX) 40 MG tablet TAKE 1 TABLET DAILY  . [DISCONTINUED] atorvastatin (LIPITOR) 40 MG tablet Take 1 tablet (40 mg total) by mouth daily.   No facility-administered encounter medications on file as of 08/24/2018.     Review of Systems  Constitutional: Negative for appetite change and unexpected weight  change.  HENT: Negative for congestion and sinus pressure.   Respiratory: Negative for cough, chest tightness and shortness of breath.   Cardiovascular: Negative for chest pain and palpitations.       Some leg swelling.  Worse as day progresses.   Gastrointestinal: Negative for abdominal pain, diarrhea, nausea and vomiting.  Genitourinary: Negative for difficulty urinating and dysuria.  Musculoskeletal: Positive for back pain. Negative for joint swelling and myalgias.  Skin: Negative for color change and rash.  Neurological: Negative for dizziness, light-headedness and headaches.  Psychiatric/Behavioral: Negative for agitation and  dysphoric mood.       Objective:    Physical Exam  Constitutional: She appears well-developed and well-nourished. No distress.  HENT:  Nose: Nose normal.  Mouth/Throat: Oropharynx is clear and moist.  Neck: Neck supple. No thyromegaly present.  Cardiovascular: Normal rate and regular rhythm.  Pulmonary/Chest: Breath sounds normal. No respiratory distress. She has no wheezes.  Abdominal: Soft. Bowel sounds are normal. There is no tenderness.  Musculoskeletal: She exhibits no edema or tenderness.  Lymphadenopathy:    She has no cervical adenopathy.  Skin: No rash noted. No erythema.  Psychiatric: She has a normal mood and affect. Her behavior is normal.    BP 136/78 (BP Location: Left Arm, Patient Position: Sitting, Cuff Size: Normal)   Pulse 87   Temp 98.3 F (36.8 C) (Oral)   Resp 16   Wt 158 lb 12.8 oz (72 kg)   SpO2 95%   BMI 29.04 kg/m  Wt Readings from Last 3 Encounters:  08/24/18 158 lb 12.8 oz (72 kg)  02/08/18 158 lb 12.8 oz (72 kg)  01/26/18 154 lb (69.9 kg)     Lab Results  Component Value Date   WBC 6.6 02/06/2018   HGB 12.8 02/06/2018   HCT 37.6 02/06/2018   PLT 196.0 02/06/2018   GLUCOSE 106 (H) 08/22/2018   CHOL 131 08/22/2018   TRIG 153.0 (H) 08/22/2018   HDL 62.60 08/22/2018   LDLDIRECT 64.9 12/08/2012   LDLCALC 38  08/22/2018   ALT 12 08/22/2018   AST 16 08/22/2018   NA 138 08/22/2018   K 3.9 08/22/2018   CL 105 08/22/2018   CREATININE 1.23 (H) 08/22/2018   BUN 20 08/22/2018   CO2 24 08/22/2018   TSH 3.68 02/06/2018   INR 1.03 04/07/2016   HGBA1C 5.6 08/22/2018       Assessment & Plan:   Problem List Items Addressed This Visit    Back pain    Persistent low back pain and pain in left left.  Xray as outlined.  Unable to take antiinflammatories.  Limits activity.  Refer to Dr Sharlet Salina.        Relevant Orders   Ambulatory referral to Orthopedic Surgery   GERD (gastroesophageal reflux disease)    Controlled on protonix.  Follow.  .        Hypercholesterolemia    On lipitor.  Low cholesterol diet and exercise.  Follow lipid panel and liver function tests.        Relevant Orders   Hepatic function panel   Lipid panel   Hyperglycemia    Low carb diet and exercise.  Follow met b and a1c.        Relevant Orders   Hemoglobin A1c   Hypertension    Blood pressure under reasonable control.  Continue same medication regimen.  Follow pressures.  Follow metabolic panel.        Relevant Orders   Basic metabolic panel   Hypothyroidism    On thyroid medication.  Follow tsh.        Relevant Orders   TSH   Thyroid nodule    Thyroid ultrasound as outlined.  Stable.  Recommended f/u in one year. Due.  Schedule thyroid ultrasound.        Relevant Orders   US THYROID       Einar Pheasant, MD

## 2018-08-26 ENCOUNTER — Encounter: Payer: Self-pay | Admitting: Internal Medicine

## 2018-08-26 NOTE — Assessment & Plan Note (Signed)
On lipitor.  Low cholesterol diet and exercise.  Follow lipid panel and liver function tests.   

## 2018-08-26 NOTE — Assessment & Plan Note (Signed)
Persistent low back pain and pain in left left.  Xray as outlined.  Unable to take antiinflammatories.  Limits activity.  Refer to Dr Sharlet Salina.

## 2018-08-26 NOTE — Assessment & Plan Note (Signed)
Blood pressure under reasonable control.  Continue same medication regimen.  Follow pressures.  Follow metabolic panel.   

## 2018-08-26 NOTE — Assessment & Plan Note (Signed)
Low carb diet and exercise.  Follow met b and a1c.   

## 2018-08-26 NOTE — Assessment & Plan Note (Signed)
Thyroid ultrasound as outlined.  Stable.  Recommended f/u in one year. Due.  Schedule thyroid ultrasound.

## 2018-08-26 NOTE — Assessment & Plan Note (Signed)
On thyroid medication.  Follow tsh.  

## 2018-08-26 NOTE — Assessment & Plan Note (Signed)
Controlled on protonix.  Follow.   

## 2018-08-27 IMAGING — CT CT ABD-PELV W/ CM
1 of 3 series · 14 of 32 positions shown, 19 images · IV contrast (APPLIED)
Comparison: Lumbar spine radiographs dated 01/28/2017

CLINICAL DATA: Generalized abdominal pain.

EXAM:
CT ABDOMEN AND PELVIS WITH CONTRAST
TECHNIQUE: Multidetector CT imaging of the abdomen and pelvis was performed
using the standard protocol following bolus administration of
intravenous contrast.
CONTRAST:  75 cc Fsovue-SBB

[Series 2: axial st · axial · 0.71mm/px · z∈[-826,-436]mm · 14 of 88 slices shown, 19 images]
[im 5/88  soft-tissue]
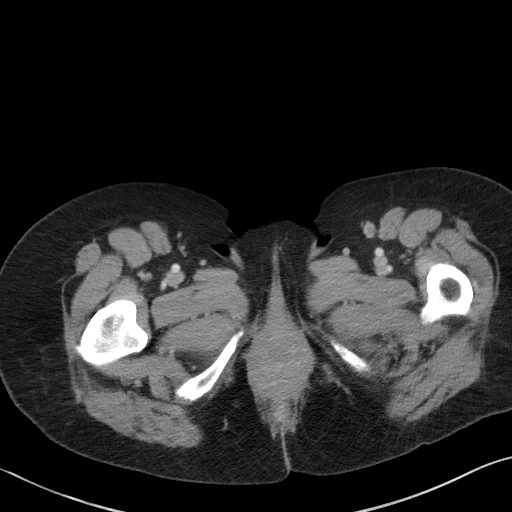
[im 5/88  bone]
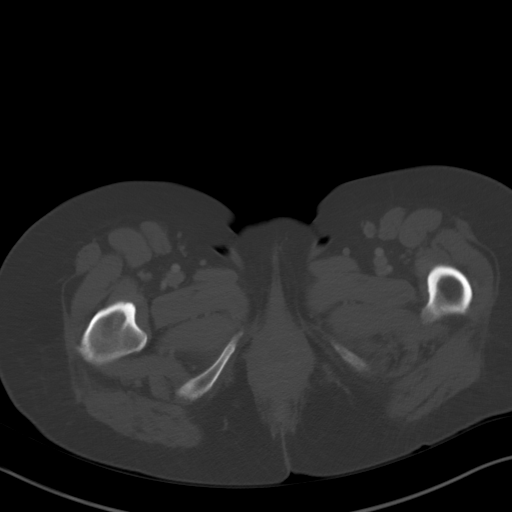
[im 14/88  soft-tissue]
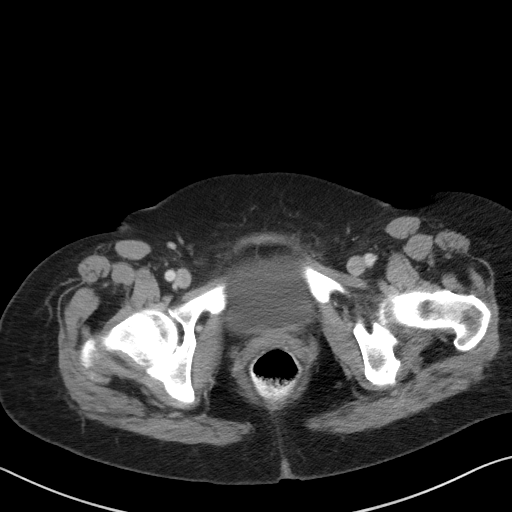
[im 19/88  soft-tissue]
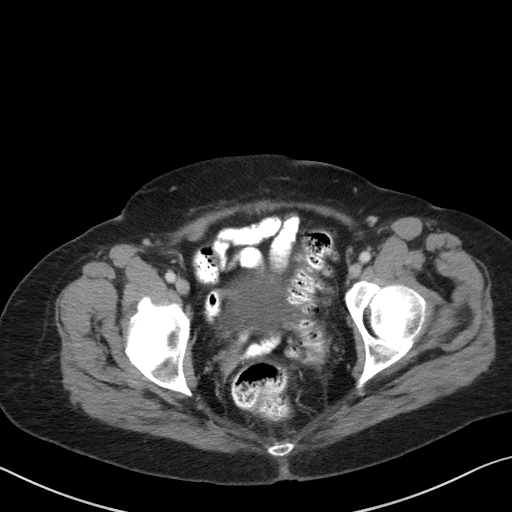
[im 23/88  soft-tissue]
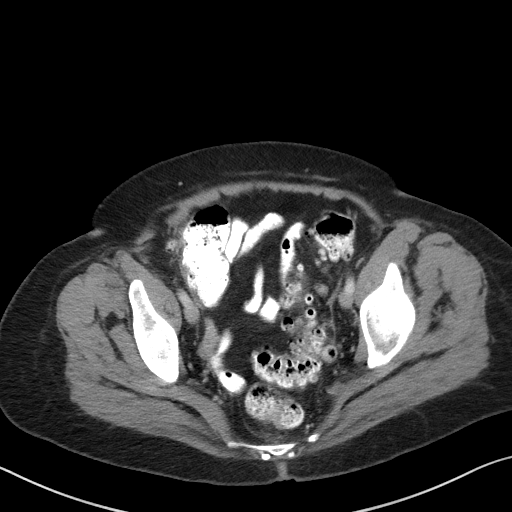
[im 33/88  soft-tissue]
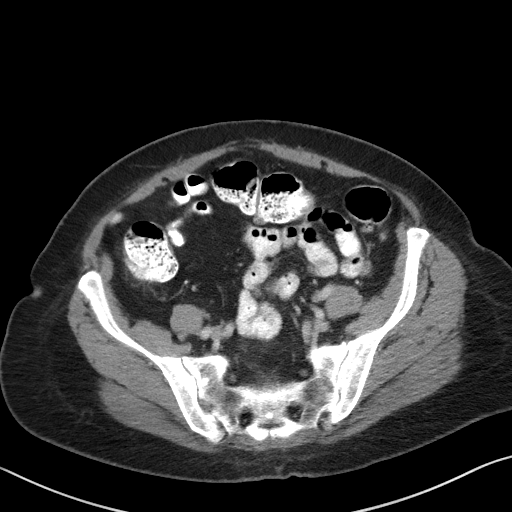
[im 37/88  soft-tissue]
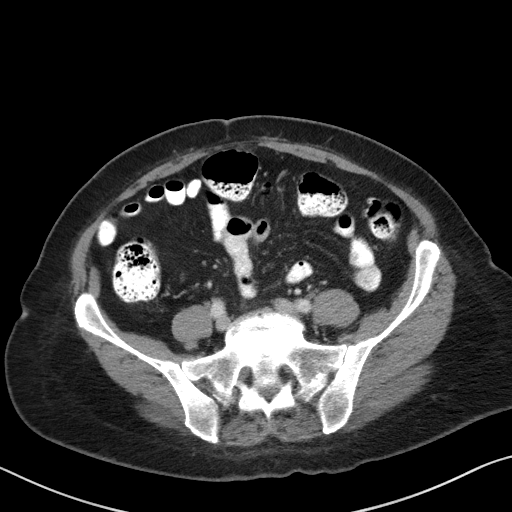
[im 46/88  soft-tissue]
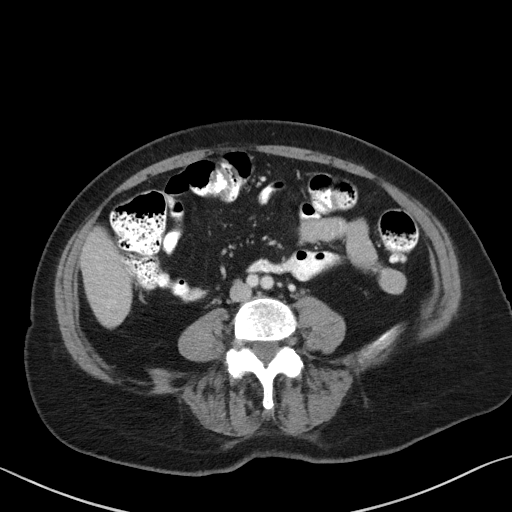
[im 51/88  soft-tissue]
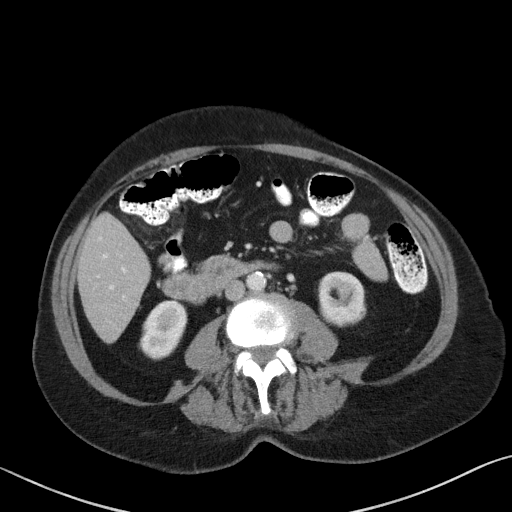
[im 55/88  soft-tissue]
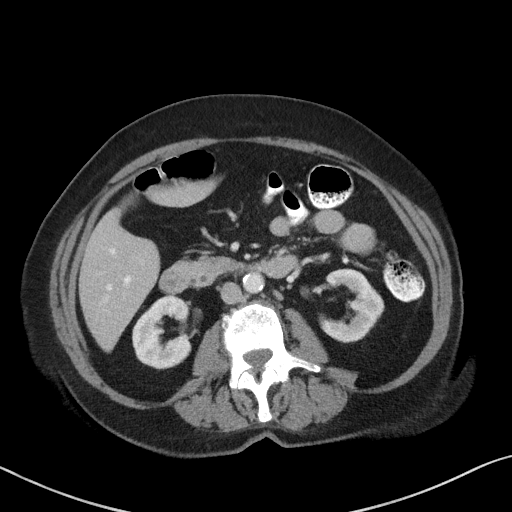
[im 55/88  bone]
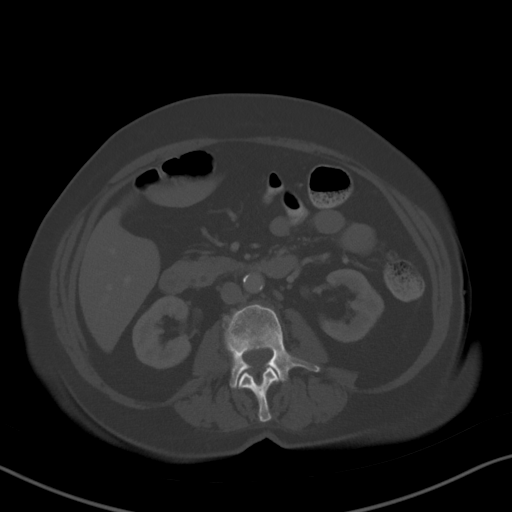
[im 65/88  soft-tissue]
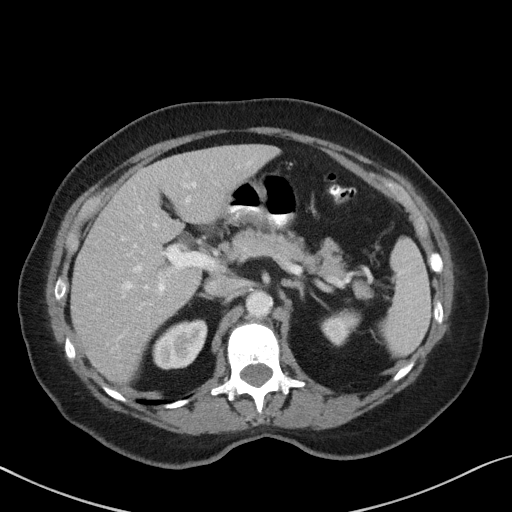
[im 69/88  soft-tissue]
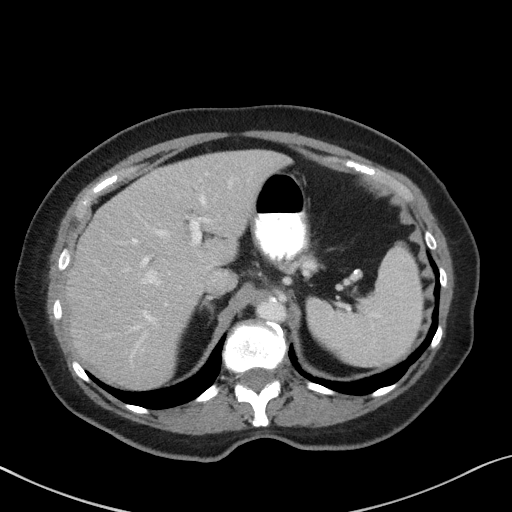
[im 69/88  lung]
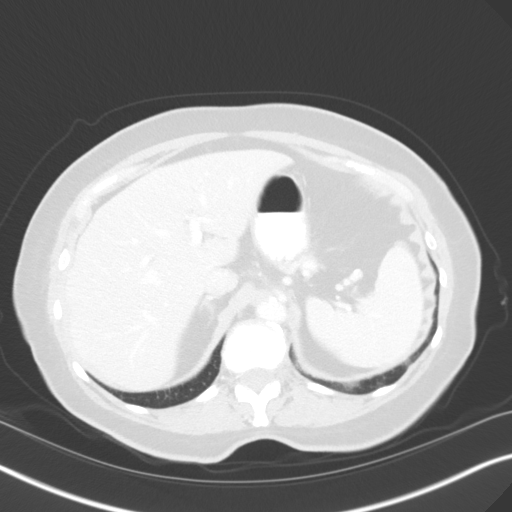
[im 74/88  soft-tissue]
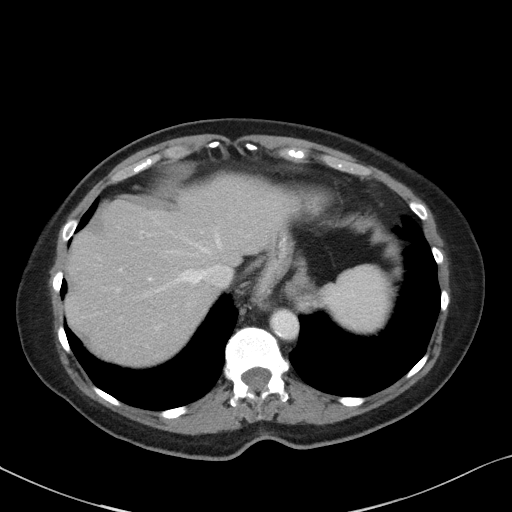
[im 74/88  lung]
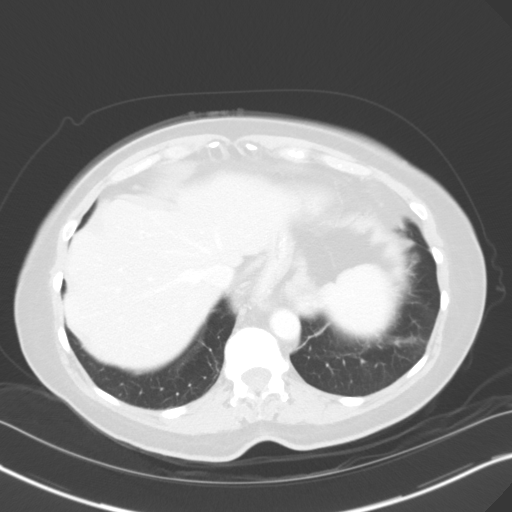
[im 78/88  lung]
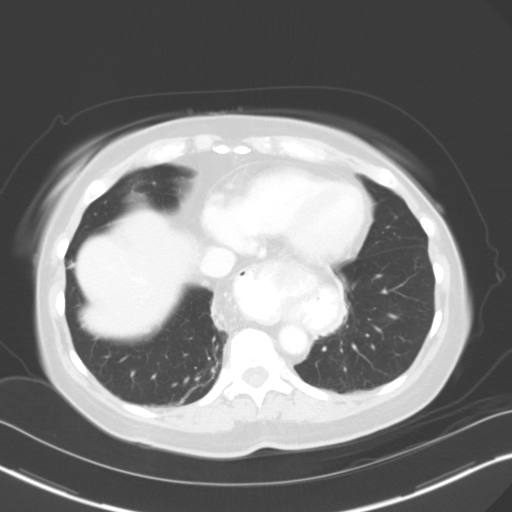
[im 83/88  soft-tissue]
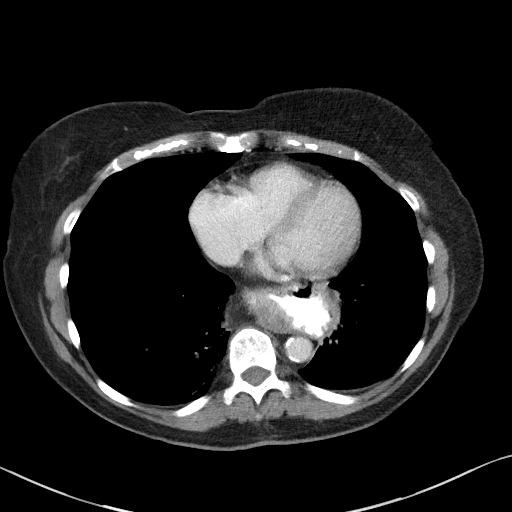
[im 83/88  lung]
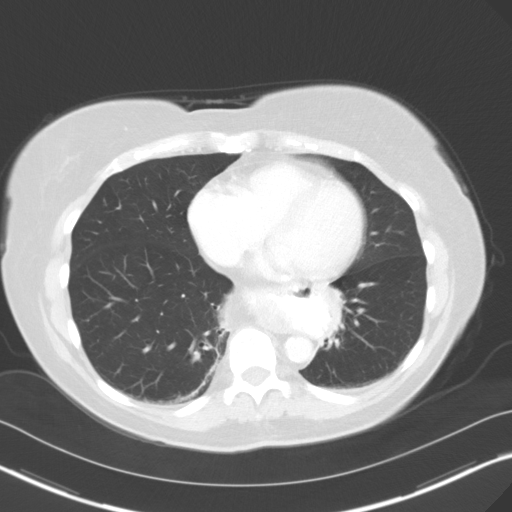

[14 of 32 positions shown; findings below may reference images not displayed]

FINDINGS: Lower chest: Small amount of linear atelectasis or scarring at both
lung bases. Large hiatal hernia. This has a paraesophageal
component.

Hepatobiliary: Cholecystectomy clips.  Small right lobe liver cyst.

Pancreas: Unremarkable. No pancreatic ductal dilatation or
surrounding inflammatory changes.

Spleen: Normal appearing spleen and small accessory splenule.

Adrenals/Urinary Tract: Small upper pole right renal cyst. Normal
appearing left kidney, ureters and urinary bladder. Normal appearing
adrenal glands.

Stomach/Bowel: Large hiatal hernia with a paraesophageal component.
Multiple colonic diverticula without evidence of diverticulitis.
Surgically absent appendix.

Vascular/Lymphatic: Atheromatous arterial calcifications without
aneurysm. No enlarged lymph nodes.

Reproductive: Surgically absent uterus.  Unremarkable ovaries.

Other: No abdominal wall hernia or abnormality. No abdominopelvic
ascites.

Musculoskeletal: Lumbar and lower thoracic spine degenerative
changes.
IMPRESSION: 1. No acute abnormality.
2. Large hiatal hernia with a paraesophageal component.
3. Colonic diverticulosis.

## 2018-09-04 ENCOUNTER — Telehealth: Payer: Self-pay

## 2018-09-04 NOTE — Telephone Encounter (Signed)
Copied from [place] [phone]. Topic: General - Inquiry >> Sep 04, 2018  3:01 PM Conception Chancy, NT wrote: Reason for CRM: patient is calling and had a missed call to schedule a ultrasound. Please advise.

## 2018-09-06 DIAGNOSIS — L821 Other seborrheic keratosis: Secondary | ICD-10-CM | POA: Diagnosis not present

## 2018-09-06 DIAGNOSIS — D1801 Hemangioma of skin and subcutaneous tissue: Secondary | ICD-10-CM | POA: Diagnosis not present

## 2018-09-07 NOTE — Telephone Encounter (Signed)
I called pt no answer I left vm with my direct number for pt to call me back. Thank you!

## 2018-09-18 ENCOUNTER — Ambulatory Visit
Admission: RE | Admit: 2018-09-18 | Discharge: 2018-09-18 | Disposition: A | Discharge status: Home / Self Care | Payer: Medicare Other | Source: Ambulatory Visit | Attending: Internal Medicine | Admitting: Internal Medicine

## 2018-09-18 DIAGNOSIS — E041 Nontoxic single thyroid nodule: Secondary | ICD-10-CM | POA: Diagnosis not present

## 2018-09-30 ENCOUNTER — Other Ambulatory Visit: Payer: Self-pay | Admitting: Internal Medicine

## 2018-10-28 ENCOUNTER — Other Ambulatory Visit: Payer: Self-pay | Admitting: Internal Medicine

## 2018-11-06 DIAGNOSIS — Z1231 Encounter for screening mammogram for malignant neoplasm of breast: Secondary | ICD-10-CM | POA: Diagnosis not present

## 2018-11-06 DIAGNOSIS — Z780 Asymptomatic menopausal state: Secondary | ICD-10-CM | POA: Diagnosis not present

## 2018-11-06 DIAGNOSIS — Z79890 Hormone replacement therapy (postmenopausal): Secondary | ICD-10-CM | POA: Diagnosis not present

## 2018-11-06 LAB — HM MAMMOGRAPHY

## 2018-11-07 ENCOUNTER — Ambulatory Visit: Payer: Medicare Other

## 2018-12-23 ENCOUNTER — Other Ambulatory Visit: Payer: Self-pay | Admitting: Internal Medicine

## 2018-12-25 ENCOUNTER — Other Ambulatory Visit: Payer: Self-pay

## 2018-12-25 ENCOUNTER — Other Ambulatory Visit (INDEPENDENT_AMBULATORY_CARE_PROVIDER_SITE_OTHER): Payer: Medicare Other

## 2018-12-25 DIAGNOSIS — E7800 Pure hypercholesterolemia, unspecified: Secondary | ICD-10-CM | POA: Diagnosis not present

## 2018-12-25 DIAGNOSIS — R739 Hyperglycemia, unspecified: Secondary | ICD-10-CM

## 2018-12-25 DIAGNOSIS — I10 Essential (primary) hypertension: Secondary | ICD-10-CM | POA: Diagnosis not present

## 2018-12-25 DIAGNOSIS — E039 Hypothyroidism, unspecified: Secondary | ICD-10-CM | POA: Diagnosis not present

## 2018-12-25 LAB — HEPATIC FUNCTION PANEL
ALT: 10 U/L (ref 0–35)
AST: 15 U/L (ref 0–37)
Albumin: 4 g/dL (ref 3.5–5.2)
Alkaline Phosphatase: 86 U/L (ref 39–117)
Bilirubin, Direct: 0.1 mg/dL (ref 0.0–0.3)
Total Bilirubin: 0.5 mg/dL (ref 0.2–1.2)
Total Protein: 6.8 g/dL (ref 6.0–8.3)

## 2018-12-25 LAB — BASIC METABOLIC PANEL
BUN: 19 mg/dL (ref 6–23)
CO2: 26 mEq/L (ref 19–32)
Calcium: 9.1 mg/dL (ref 8.4–10.5)
Chloride: 103 mEq/L (ref 96–112)
Creatinine, Ser: 1.31 mg/dL — ABNORMAL HIGH (ref 0.40–1.20)
GFR: 38.64 mL/min — ABNORMAL LOW (ref >60.00)
Glucose, Bld: 100 mg/dL — ABNORMAL HIGH (ref 70–99)
Potassium: 4.4 mEq/L (ref 3.5–5.1)
Sodium: 138 mEq/L (ref 135–145)

## 2018-12-25 LAB — HEMOGLOBIN A1C: Hgb A1c MFr Bld: 5.7 % (ref 4.6–6.5)

## 2018-12-25 LAB — LIPID PANEL
Cholesterol: 151 mg/dL (ref 0–200)
HDL: 66.9 mg/dL (ref >39.00)
LDL Cholesterol: 52 mg/dL (ref 0–99)
NonHDL: 84.19
Total CHOL/HDL Ratio: 2
Triglycerides: 159 mg/dL — ABNORMAL HIGH (ref 0.0–149.0)
VLDL: 31.8 mg/dL (ref 0.0–40.0)

## 2018-12-25 LAB — TSH: TSH: 5.42 u[IU]/mL — ABNORMAL HIGH (ref 0.35–4.50)

## 2018-12-27 ENCOUNTER — Encounter: Payer: Self-pay | Admitting: Internal Medicine

## 2018-12-27 ENCOUNTER — Ambulatory Visit (INDEPENDENT_AMBULATORY_CARE_PROVIDER_SITE_OTHER): Payer: Medicare Other | Admitting: Internal Medicine

## 2018-12-27 DIAGNOSIS — E039 Hypothyroidism, unspecified: Secondary | ICD-10-CM

## 2018-12-27 DIAGNOSIS — N183 Chronic kidney disease, stage 3 unspecified: Secondary | ICD-10-CM

## 2018-12-27 DIAGNOSIS — M7989 Other specified soft tissue disorders: Secondary | ICD-10-CM

## 2018-12-27 DIAGNOSIS — E7800 Pure hypercholesterolemia, unspecified: Secondary | ICD-10-CM | POA: Diagnosis not present

## 2018-12-27 DIAGNOSIS — M5442 Lumbago with sciatica, left side: Secondary | ICD-10-CM

## 2018-12-27 DIAGNOSIS — K219 Gastro-esophageal reflux disease without esophagitis: Secondary | ICD-10-CM

## 2018-12-27 DIAGNOSIS — R739 Hyperglycemia, unspecified: Secondary | ICD-10-CM

## 2018-12-27 DIAGNOSIS — I10 Essential (primary) hypertension: Secondary | ICD-10-CM | POA: Diagnosis not present

## 2018-12-27 MED ORDER — LEVOTHYROXINE SODIUM 75 MCG PO TABS: 75.0000 ug | ORAL_TABLET | Freq: Every day | ORAL | 1 refills | Status: DC

## 2018-12-27 NOTE — Progress Notes (Addendum)
[name], female   DOB: 01/22/1932, 85 y.o.   MRN: [id number] Virtual Visit via Video Note  I connected with [name] on 12/27/18 at 10:30 AM EDT by telephone.  Verified that I am speaking with the correct person using two identifiers.  Location patient: home Location provider:work  Persons participating in the telephone visit: patient, provider  I discussed the limitations of evaluation and management by telephone.  This visit type was conducted due to national recommendations for restrictions regarding the COVID-19 pandemic.  This format is felt to be most appropriate for this patient at this time.  The patient expressed understanding and agreed to proceed.   HPI: This is a scheduled follow up.  She reports she is doing well.  Has had some lower extremity swelling.  Tried compression hose.  Felt aggravated.  Has been elevating her legs now and this has helped.  No chest pain.  No sob.  No acid reflux.  No abdominal pain.  Bowels moving.  Still having some left leg pain and low bck pain.  Was scheduled to see Dr Sharlet Salina.  Desires no further intervention at this time.  Had f/u thyroid ultrasound.  Stable.  Overall she feels she is doing relatively well.  Discussed recetn labs.  Discussed decreased kidney function.  Slightly more decreased then previous check.  Discussed the need to avoid antiinflammatories.     ROS: See pertinent positives and negatives per HPI.  Past Medical History:  Diagnosis Date  . Arthritis    lower back, hands  . Benign esophageal stricture   . Complication of anesthesia   . Diverticulosis   . Diverticulosis   . Dysphagia   . Family history of adverse reaction to anesthesia    brother had a hard time waking up with hernia surgery as a child.  Marland Kitchen GERD (gastroesophageal reflux disease)   . Headache(784.0)    h/o recurrent vascular headaches. no longer gets these  . Hypercholesterolemia   . Hypertension   . Hypothyroidism   . IBS (irritable  bowel syndrome)   . Leg swelling   . Neck fullness 2016   dr. Nicki Reaper reviewed and found it to be nothing.  prinivil seemed to cause face and neck and tongue swelling  . PONV (postoperative nausea and vomiting)   . Transient global amnesia    x1, over 25 yrs ago after migraine  . Urinary tract bacterial infections   . Vertigo    x1 - several yrs ago.  OK after Epley maneuver    Past Surgical History:  Procedure Laterality Date  . ABDOMINAL HYSTERECTOMY    . APPENDECTOMY    . CATARACT EXTRACTION W/PHACO Right 10/12/2017   Procedure: CATARACT EXTRACTION PHACO AND INTRAOCULAR LENS PLACEMENT ([place]) RIGHT;  Surgeon: Leandrew Koyanagi, MD;  Location: [place];  Service: Ophthalmology;  Laterality: Right;  . CATARACT EXTRACTION W/PHACO Left 11/09/2017   Procedure: CATARACT EXTRACTION PHACO AND INTRAOCULAR LENS PLACEMENT ([place]) LEFT toric;  Surgeon: Leandrew Koyanagi, MD;  Location: [place];  Service: Ophthalmology;  Laterality: Left;  . CHOLECYSTECTOMY  1990  . CYSTOSCOPY     x2  . ESOPHAGOGASTRODUODENOSCOPY (EGD) WITH PROPOFOL N/A 08/28/2015   Procedure: ESOPHAGOGASTRODUODENOSCOPY (EGD) WITH PROPOFOL;  Surgeon: Hulen Luster, MD;  Location: [facility] ENDOSCOPY;  Service: Gastroenterology;  Laterality: N/A;  . ESOPHAGOGASTRODUODENOSCOPY (EGD) WITH PROPOFOL N/A 04/04/2017   Procedure: ESOPHAGOGASTRODUODENOSCOPY (EGD) WITH PROPOFOL;  Surgeon: Lollie Sails, MD;  Location: [facility] ENDOSCOPY;  Service: Endoscopy;  Laterality:  N/A;  . PARTIAL HYSTERECTOMY    . PARTIAL KNEE ARTHROPLASTY Left 04/15/2016   Procedure: UNICOMPARTMENTAL KNEE;  Surgeon: Corky Mull, MD;  Location: [facility];  Service: Orthopedics;  Laterality: Left;  . TONSILLECTOMY      Family History  Problem Relation Age of Onset  . Heart disease Father   . Heart disease Mother   . Heart disease Brother   . Heart disease Brother   . Breast cancer Neg Hx   . Colon cancer Neg Hx     SOCIAL HX: reviewed.      Current Outpatient Medications:  .  amitriptyline (ELAVIL) 25 MG tablet, Take 25 mg by mouth at bedtime., Disp: , Rfl:  .  amLODipine (NORVASC) 10 MG tablet, TAKE 1 TABLET DAILY, Disp: 90 tablet, Rfl: 1 .  aspirin 81 MG tablet, Take 81 mg by mouth daily., Disp: , Rfl:  .  atorvastatin (LIPITOR) 40 MG tablet, TAKE 1 TABLET DAILY, Disp: 90 tablet, Rfl: 3 .  Calcium Carbonate-Vitamin D (CALCIUM 600+D) 600-400 MG-UNIT tablet, Take 1 tablet by mouth daily., Disp: , Rfl:  .  Cholecalciferol (VITAMIN D3) 1000 units CAPS, Take by mouth., Disp: , Rfl:  .  ciclopirox (PENLAC) 8 % solution, Apply topically at bedtime. Apply over nail and surrounding skin. Apply daily over previous coat. After seven (7) days, may remove with alcohol and continue cycle. (Patient not taking: Reported on 11/09/2017), Disp: 6.6 mL, Rfl: 0 .  estrogens, conjugated, (PREMARIN) 0.9 MG tablet, Take 0.9 mg by mouth daily. , Disp: , Rfl:  .  levothyroxine (SYNTHROID) 75 MCG tablet, Take 1 tablet (75 mcg total) by mouth daily before breakfast., Disp: 90 tablet, Rfl: 1 .  metoprolol succinate (TOPROL-XL) 50 MG 24 hr tablet, TAKE 1 TABLET TWICE A DAY  WITH OR IMMEDIATELY        FOLLOWING MEALS, Disp: 180 tablet, Rfl: 1 .  pantoprazole (PROTONIX) 40 MG tablet, TAKE 1 TABLET DAILY, Disp: 90 tablet, Rfl: 3  EXAM:  GENERAL: alert. Sounds in no acute distress.    PSYCH/NEURO: pleasant and cooperative, no obvious depression or anxiety, speech and thought processing grossly intact  ASSESSMENT AND PLAN:  Discussed the following assessment and plan:  Left-sided low back pain with left-sided sciatica, unspecified chronicity  Gastroesophageal reflux disease, esophagitis presence not specified  Hypercholesterolemia - Plan: Hepatic function panel, Lipid panel  Hyperglycemia - Plan: Hemoglobin A1c  Essential hypertension - Plan: CBC with Differential/Platelet, Basic metabolic panel  Hypothyroidism, unspecified type - Plan: TSH  Leg  swelling  CKD (chronic kidney disease), stage III ([place]) - Plan: Ambulatory referral to Nephrology     I discussed the assessment and treatment plan with the patient. The patient was provided an opportunity to ask questions and all were answered. The patient agreed with the plan and demonstrated an understanding of the instructions.   The patient was advised to call back or seek an in-person evaluation if the symptoms worsen or if the condition fails to improve as anticipated.  I provided 25 minutes of non-face-to-face time during this encounter.   Einar Pheasant, MD

## 2018-12-28 ENCOUNTER — Telehealth: Payer: Self-pay | Admitting: Internal Medicine

## 2018-12-28 NOTE — Telephone Encounter (Signed)
Pt would like to have fasting labs done before her Physical appt on 05/18/2019. Order needed please and thank you!

## 2018-12-28 NOTE — Telephone Encounter (Signed)
Lab orders needed in August

## 2018-12-30 ENCOUNTER — Encounter: Payer: Self-pay | Admitting: Internal Medicine

## 2018-12-30 DIAGNOSIS — N183 Chronic kidney disease, stage 3 unspecified: Secondary | ICD-10-CM | POA: Insufficient documentation

## 2018-12-30 NOTE — Assessment & Plan Note (Signed)
GFR slightly more decreased.  Recent check:  38.  Continue to avoid antiinflammatories.  We will follow.  Discussed referral to nephrology.  She is in agreement.

## 2018-12-30 NOTE — Assessment & Plan Note (Signed)
Controlled on current regimen.  Follow.  

## 2018-12-30 NOTE — Assessment & Plan Note (Signed)
States her blood pressure has been doing well.  Same medication.  Follow pressures.  Follow metabolic panel.

## 2018-12-30 NOTE — Telephone Encounter (Signed)
Labs ordered.

## 2018-12-30 NOTE — Assessment & Plan Note (Addendum)
On thyroid medication.  Recent tsh slightly elevated.  Increase synthroid to 59mcg q day.  Follow tsh.

## 2018-12-30 NOTE — Assessment & Plan Note (Signed)
Stable.  Follow.  Continue leg elevation.

## 2018-12-30 NOTE — Assessment & Plan Note (Signed)
Low carb diet and exercise.  Follow met b and a1c.   

## 2018-12-30 NOTE — Assessment & Plan Note (Signed)
Persistent low back pain and left leg pain.  Feels stable.  Desires no further w/up, treatment or evaluation at this time.  Follow.  Will notify me if desires any further w/up or evaluation.

## 2018-12-30 NOTE — Assessment & Plan Note (Signed)
On lipitor.  Low cholesterol diet and exercise.  Follow lipid panel and liver function tests.   Lab Results  Component Value Date   CHOL 151 12/25/2018   HDL 66.90 12/25/2018   LDLCALC 52 12/25/2018   LDLDIRECT 64.9 12/08/2012   TRIG 159.0 (H) 12/25/2018   CHOLHDL 2 12/25/2018

## 2019-03-22 ENCOUNTER — Other Ambulatory Visit: Payer: Self-pay | Admitting: Internal Medicine

## 2019-04-17 DIAGNOSIS — N183 Chronic kidney disease, stage 3 (moderate): Secondary | ICD-10-CM | POA: Diagnosis not present

## 2019-04-17 DIAGNOSIS — I129 Hypertensive chronic kidney disease with stage 1 through stage 4 chronic kidney disease, or unspecified chronic kidney disease: Secondary | ICD-10-CM | POA: Diagnosis not present

## 2019-04-18 ENCOUNTER — Other Ambulatory Visit: Payer: Self-pay | Admitting: Nephrology

## 2019-04-18 DIAGNOSIS — N183 Chronic kidney disease, stage 3 unspecified: Secondary | ICD-10-CM

## 2019-04-30 ENCOUNTER — Ambulatory Visit
Admission: RE | Admit: 2019-04-30 | Discharge: 2019-04-30 | Disposition: A | Discharge status: Home / Self Care | Payer: Medicare Other | Source: Ambulatory Visit | Attending: Nephrology | Admitting: Nephrology

## 2019-04-30 ENCOUNTER — Other Ambulatory Visit: Payer: Self-pay

## 2019-04-30 DIAGNOSIS — N183 Chronic kidney disease, stage 3 unspecified: Secondary | ICD-10-CM

## 2019-05-09 ENCOUNTER — Other Ambulatory Visit: Payer: Medicare Other

## 2019-05-10 ENCOUNTER — Other Ambulatory Visit: Payer: Self-pay

## 2019-05-10 ENCOUNTER — Other Ambulatory Visit (INDEPENDENT_AMBULATORY_CARE_PROVIDER_SITE_OTHER): Payer: Medicare Other

## 2019-05-10 DIAGNOSIS — I10 Essential (primary) hypertension: Secondary | ICD-10-CM | POA: Diagnosis not present

## 2019-05-10 DIAGNOSIS — E039 Hypothyroidism, unspecified: Secondary | ICD-10-CM | POA: Diagnosis not present

## 2019-05-10 DIAGNOSIS — R739 Hyperglycemia, unspecified: Secondary | ICD-10-CM | POA: Diagnosis not present

## 2019-05-10 DIAGNOSIS — E7800 Pure hypercholesterolemia, unspecified: Secondary | ICD-10-CM

## 2019-05-10 LAB — LIPID PANEL
Cholesterol: 150 mg/dL (ref 0–200)
HDL: 69.6 mg/dL (ref >39.00)
LDL Cholesterol: 48 mg/dL (ref 0–99)
NonHDL: 80.41
Total CHOL/HDL Ratio: 2
Triglycerides: 163 mg/dL — ABNORMAL HIGH (ref 0.0–149.0)
VLDL: 32.6 mg/dL (ref 0.0–40.0)

## 2019-05-10 LAB — HEPATIC FUNCTION PANEL
ALT: 14 U/L (ref 0–35)
AST: 20 U/L (ref 0–37)
Albumin: 4 g/dL (ref 3.5–5.2)
Alkaline Phosphatase: 91 U/L (ref 39–117)
Bilirubin, Direct: 0.1 mg/dL (ref 0.0–0.3)
Total Bilirubin: 0.5 mg/dL (ref 0.2–1.2)
Total Protein: 6.6 g/dL (ref 6.0–8.3)

## 2019-05-10 LAB — BASIC METABOLIC PANEL
BUN: 19 mg/dL (ref 6–23)
CO2: 22 mEq/L (ref 19–32)
Calcium: 9 mg/dL (ref 8.4–10.5)
Chloride: 104 mEq/L (ref 96–112)
Creatinine, Ser: 1.21 mg/dL — ABNORMAL HIGH (ref 0.40–1.20)
GFR: 42.31 mL/min — ABNORMAL LOW (ref >60.00)
Glucose, Bld: 105 mg/dL — ABNORMAL HIGH (ref 70–99)
Potassium: 4.4 mEq/L (ref 3.5–5.1)
Sodium: 137 mEq/L (ref 135–145)

## 2019-05-10 LAB — CBC WITH DIFFERENTIAL/PLATELET
Basophils Absolute: 0.1 10*3/uL (ref 0.0–0.1)
Basophils Relative: 1.2 % (ref 0.0–3.0)
Eosinophils Absolute: 0.3 10*3/uL (ref 0.0–0.7)
Eosinophils Relative: 3.7 % (ref 0.0–5.0)
HCT: 39.1 % (ref 36.0–46.0)
Hemoglobin: 13 g/dL (ref 12.0–15.0)
Lymphocytes Relative: 14.7 % (ref 12.0–46.0)
Lymphs Abs: 1.2 10*3/uL (ref 0.7–4.0)
MCHC: 33.3 g/dL (ref 30.0–36.0)
MCV: 94.1 fl (ref 78.0–100.0)
Monocytes Absolute: 0.7 10*3/uL (ref 0.1–1.0)
Monocytes Relative: 7.8 % (ref 3.0–12.0)
Neutro Abs: 6.1 10*3/uL (ref 1.4–7.7)
Neutrophils Relative %: 72.6 % (ref 43.0–77.0)
Platelets: 177 10*3/uL (ref 150.0–400.0)
RBC: 4.15 Mil/uL (ref 3.87–5.11)
RDW: 12.3 % (ref 11.5–15.5)
WBC: 8.4 10*3/uL (ref 4.0–10.5)

## 2019-05-10 LAB — HEMOGLOBIN A1C: Hgb A1c MFr Bld: 5.6 % (ref 4.6–6.5)

## 2019-05-10 LAB — TSH: TSH: 2.33 u[IU]/mL (ref 0.35–4.50)

## 2019-05-11 ENCOUNTER — Ambulatory Visit (INDEPENDENT_AMBULATORY_CARE_PROVIDER_SITE_OTHER): Payer: Medicare Other | Admitting: Internal Medicine

## 2019-05-11 ENCOUNTER — Encounter: Payer: Self-pay | Admitting: Internal Medicine

## 2019-05-11 DIAGNOSIS — I10 Essential (primary) hypertension: Secondary | ICD-10-CM | POA: Diagnosis not present

## 2019-05-11 DIAGNOSIS — M5442 Lumbago with sciatica, left side: Secondary | ICD-10-CM | POA: Diagnosis not present

## 2019-05-11 DIAGNOSIS — R739 Hyperglycemia, unspecified: Secondary | ICD-10-CM

## 2019-05-11 DIAGNOSIS — N183 Chronic kidney disease, stage 3 unspecified: Secondary | ICD-10-CM

## 2019-05-11 DIAGNOSIS — E7800 Pure hypercholesterolemia, unspecified: Secondary | ICD-10-CM

## 2019-05-11 DIAGNOSIS — K219 Gastro-esophageal reflux disease without esophagitis: Secondary | ICD-10-CM

## 2019-05-11 DIAGNOSIS — E039 Hypothyroidism, unspecified: Secondary | ICD-10-CM

## 2019-05-11 NOTE — Progress Notes (Signed)
[name], female   DOB: [birth year], 85 y.o.   MRN: [id number]   Virtual Visit via telephone Note  This visit type was conducted due to national recommendations for restrictions regarding the COVID-19 pandemic (e.g. social distancing).  This format is felt to be most appropriate for this patient at this time.  All issues noted in this document were discussed and addressed.  No physical exam was performed (except for noted visual exam findings with Video Visits).   I connected with Elizabeth Henderson by telephone and verified that I am speaking with the correct person using two identifiers. Location patient: home Location provider: work  Persons participating in the telephone visit: patient, provider  I discussed the limitations, risks, security and privacy concerns of performing an evaluation and management service by telephone and the availability of in person appointments. The patient expressed understanding and agreed to proceed.   Reason for visit: scheduled follow up.    HPI: She reports she is doing well.  Has been having problems with low back pain and pain into left leg.  States when she sits - pain stops.  Standing - hurts.  Sleeps ok.  Desires no further intervention at this time.  Swelling stable.  Better in am.  Seeing Dr Candiss Norse for f/u ckd.  Stable.  Also reports hair loss.  Persistent.  Labs wnl.  Discussed referral to dermatology.  She wants to monitor for now.  No chest pain.  No sob.  No acid reflux.  No abdominal pain.  Bowels moving.  Discussed recent labs.  Discussed shingrix.     ROS: See pertinent positives and negatives per HPI.  Past Medical History:  Diagnosis Date  . Arthritis    lower back, hands  . Benign esophageal stricture   . Complication of anesthesia   . Diverticulosis   . Diverticulosis   . Dysphagia   . Family history of adverse reaction to anesthesia    brother had a hard time waking up with hernia surgery as a child.  Marland Kitchen GERD  (gastroesophageal reflux disease)   . Headache(784.0)    h/o recurrent vascular headaches. no longer gets these  . Hypercholesterolemia   . Hypertension   . Hypothyroidism   . IBS (irritable bowel syndrome)   . Leg swelling   . Neck fullness 2016   dr. Nicki Reaper reviewed and found it to be nothing.  prinivil seemed to cause face and neck and tongue swelling  . PONV (postoperative nausea and vomiting)   . Transient global amnesia    x1, over 25 yrs ago after migraine  . Urinary tract bacterial infections   . Vertigo    x1 - several yrs ago.  OK after Epley maneuver    Past Surgical History:  Procedure Laterality Date  . ABDOMINAL HYSTERECTOMY    . APPENDECTOMY    . CATARACT EXTRACTION W/PHACO Right 10/12/2017   Procedure: CATARACT EXTRACTION PHACO AND INTRAOCULAR LENS PLACEMENT ([place]) RIGHT;  Surgeon: Leandrew Koyanagi, MD;  Location: [place];  Service: Ophthalmology;  Laterality: Right;  . CATARACT EXTRACTION W/PHACO Left 11/09/2017   Procedure: CATARACT EXTRACTION PHACO AND INTRAOCULAR LENS PLACEMENT ([place]) LEFT toric;  Surgeon: Leandrew Koyanagi, MD;  Location: [place];  Service: Ophthalmology;  Laterality: Left;  . CHOLECYSTECTOMY  1990  . CYSTOSCOPY     x2  . ESOPHAGOGASTRODUODENOSCOPY (EGD) WITH PROPOFOL N/A 08/28/2015   Procedure: ESOPHAGOGASTRODUODENOSCOPY (EGD) WITH PROPOFOL;  Surgeon: Hulen Luster, MD;  Location: [facility] ENDOSCOPY;  Service:  Gastroenterology;  Laterality: N/A;  . ESOPHAGOGASTRODUODENOSCOPY (EGD) WITH PROPOFOL N/A 04/04/2017   Procedure: ESOPHAGOGASTRODUODENOSCOPY (EGD) WITH PROPOFOL;  Surgeon: Lollie Sails, MD;  Location: [facility] ENDOSCOPY;  Service: Endoscopy;  Laterality: N/A;  . PARTIAL HYSTERECTOMY    . PARTIAL KNEE ARTHROPLASTY Left 04/15/2016   Procedure: UNICOMPARTMENTAL KNEE;  Surgeon: Corky Mull, MD;  Location: [facility];  Service: Orthopedics;  Laterality: Left;  . TONSILLECTOMY      Family History  Problem Relation Age of  Onset  . Heart disease Father   . Heart disease Mother   . Heart disease Brother   . Heart disease Brother   . Breast cancer Neg Hx   . Colon cancer Neg Hx     SOCIAL HX: reviewed.    Current Outpatient Medications:  .  amitriptyline (ELAVIL) 25 MG tablet, Take 25 mg by mouth at bedtime., Disp: , Rfl:  .  amLODipine (NORVASC) 10 MG tablet, TAKE 1 TABLET DAILY, Disp: 90 tablet, Rfl: 1 .  aspirin 81 MG tablet, Take 81 mg by mouth daily., Disp: , Rfl:  .  atorvastatin (LIPITOR) 40 MG tablet, TAKE 1 TABLET DAILY, Disp: 90 tablet, Rfl: 3 .  Calcium Carbonate-Vitamin D (CALCIUM 600+D) 600-400 MG-UNIT tablet, Take 1 tablet by mouth daily., Disp: , Rfl:  .  Cholecalciferol (VITAMIN D3) 1000 units CAPS, Take by mouth., Disp: , Rfl:  .  ciclopirox (PENLAC) 8 % solution, Apply topically at bedtime. Apply over nail and surrounding skin. Apply daily over previous coat. After seven (7) days, may remove with alcohol and continue cycle. (Patient not taking: Reported on 11/09/2017), Disp: 6.6 mL, Rfl: 0 .  estrogens, conjugated, (PREMARIN) 0.9 MG tablet, Take 0.9 mg by mouth daily. , Disp: , Rfl:  .  levothyroxine (SYNTHROID) 75 MCG tablet, Take 1 tablet (75 mcg total) by mouth daily before breakfast., Disp: 90 tablet, Rfl: 1 .  metoprolol succinate (TOPROL-XL) 50 MG 24 hr tablet, TAKE 1 TABLET TWICE A DAY  WITH OR IMMEDIATELY        FOLLOWING MEALS, Disp: 180 tablet, Rfl: 1 .  pantoprazole (PROTONIX) 40 MG tablet, TAKE 1 TABLET DAILY, Disp: 90 tablet, Rfl: 3  EXAM:  GENERAL: alert.  Sounds to be in no acute distress.  Answering questions appropriately.    PSYCH/NEURO: pleasant and cooperative, no obvious depression or anxiety, speech and thought processing grossly intact  ASSESSMENT AND PLAN:  Discussed the following assessment and plan:  Back pain Persistent low back pain and left leg pain.  Stable.  Desires no further intervention at this time.  Follow.    CKD (chronic kidney disease), stage  III [facility]) Seeing nephrology.  Stable.  Avoid antiinflammatories.    GERD (gastroesophageal reflux disease) Controlled.    Hypercholesterolemia On lipitor.  Low cholesterol diet and exercise.  Follow lipid panel and liver function tests.    Hyperglycemia Low carb diet and exercise.  Follow met b and a1c.   Hypertension Blood pressure under good control.  Continue same medication regimen.  Follow pressures.  Follow metabolic panel.    Hypothyroidism On thyroid replacement.  Follow tsh.      I discussed the assessment and treatment plan with the patient. The patient was provided an opportunity to ask questions and all were answered. The patient agreed with the plan and demonstrated an understanding of the instructions.   The patient was advised to call back or seek an in-person evaluation if the symptoms worsen or if the condition fails to improve  as anticipated.  I provided 23 minutes of non-face-to-face time during this encounter.   Einar Pheasant, MD

## 2019-05-14 ENCOUNTER — Other Ambulatory Visit: Payer: Medicare Other

## 2019-05-14 ENCOUNTER — Encounter: Payer: Self-pay | Admitting: Internal Medicine

## 2019-05-14 NOTE — Assessment & Plan Note (Signed)
Seeing nephrology.  Stable.  Avoid antiinflammatories.   

## 2019-05-14 NOTE — Assessment & Plan Note (Signed)
Blood pressure under good control.  Continue same medication regimen.  Follow pressures.  Follow metabolic panel.   

## 2019-05-14 NOTE — Assessment & Plan Note (Signed)
On lipitor.  Low cholesterol diet and exercise.  Follow lipid panel and liver function tests.   

## 2019-05-14 NOTE — Assessment & Plan Note (Signed)
Controlled.  

## 2019-05-14 NOTE — Assessment & Plan Note (Signed)
On thyroid replacement.  Follow tsh.  

## 2019-05-14 NOTE — Assessment & Plan Note (Signed)
Persistent low back pain and left leg pain.  Stable.  Desires no further intervention at this time.  Follow.

## 2019-05-14 NOTE — Assessment & Plan Note (Signed)
Low carb diet and exercise.  Follow met b and a1c.  

## 2019-05-18 ENCOUNTER — Encounter: Payer: Medicare Other | Admitting: Internal Medicine

## 2019-06-04 DIAGNOSIS — Z23 Encounter for immunization: Secondary | ICD-10-CM | POA: Diagnosis not present

## 2019-06-07 ENCOUNTER — Other Ambulatory Visit: Payer: Self-pay | Admitting: Internal Medicine

## 2019-07-06 ENCOUNTER — Other Ambulatory Visit: Payer: Self-pay | Admitting: Internal Medicine

## 2019-07-17 ENCOUNTER — Other Ambulatory Visit: Payer: Self-pay

## 2019-07-17 DIAGNOSIS — Z961 Presence of intraocular lens: Secondary | ICD-10-CM | POA: Diagnosis not present

## 2019-07-20 ENCOUNTER — Other Ambulatory Visit: Payer: Self-pay

## 2019-07-20 ENCOUNTER — Ambulatory Visit (INDEPENDENT_AMBULATORY_CARE_PROVIDER_SITE_OTHER): Payer: Medicare Other | Admitting: Internal Medicine

## 2019-07-20 DIAGNOSIS — R739 Hyperglycemia, unspecified: Secondary | ICD-10-CM | POA: Diagnosis not present

## 2019-07-20 DIAGNOSIS — N183 Chronic kidney disease, stage 3 unspecified: Secondary | ICD-10-CM

## 2019-07-20 DIAGNOSIS — E7800 Pure hypercholesterolemia, unspecified: Secondary | ICD-10-CM

## 2019-07-20 DIAGNOSIS — I10 Essential (primary) hypertension: Secondary | ICD-10-CM | POA: Diagnosis not present

## 2019-07-20 DIAGNOSIS — Z0000 Encounter for general adult medical examination without abnormal findings: Secondary | ICD-10-CM

## 2019-07-20 DIAGNOSIS — K219 Gastro-esophageal reflux disease without esophagitis: Secondary | ICD-10-CM | POA: Diagnosis not present

## 2019-07-20 DIAGNOSIS — E039 Hypothyroidism, unspecified: Secondary | ICD-10-CM

## 2019-07-20 DIAGNOSIS — M5442 Lumbago with sciatica, left side: Secondary | ICD-10-CM | POA: Diagnosis not present

## 2019-07-20 NOTE — Progress Notes (Signed)
[name], female   DOB: 05/19/1932, 85 y.o.   MRN: [id number]   Subjective:    [name], female    DOB: 1931/11/23, 85 y.o.   MRN: [id number]  HPI  Patient with past history of hypertension, hypercholesterolemia and hypothyroidism who comes in today to follow up on these issues as well as for a complete physical exam.  She reports she is doing relatively well.  No chest pain.  No sob.  No acid reflux.  No abdominal pain.  Bowels moving.  Discussed shingrix.  Had flu shot.  Discussed hair loss.  Will let me know if desires referral to dermatology.  Blood pressure doing well.    Past Medical History:  Diagnosis Date  . Arthritis    lower back, hands  . Benign esophageal stricture   . Complication of anesthesia   . Diverticulosis   . Diverticulosis   . Dysphagia   . Family history of adverse reaction to anesthesia    brother had a hard time waking up with hernia surgery as a child.  Marland Kitchen GERD (gastroesophageal reflux disease)   . Headache(784.0)    h/o recurrent vascular headaches. no longer gets these  . Hypercholesterolemia   . Hypertension   . Hypothyroidism   . IBS (irritable bowel syndrome)   . Leg swelling   . Neck fullness 2016   dr. Nicki Reaper reviewed and found it to be nothing.  prinivil seemed to cause face and neck and tongue swelling  . PONV (postoperative nausea and vomiting)   . Transient global amnesia    x1, over 25 yrs ago after migraine  . Urinary tract bacterial infections   . Vertigo    x1 - several yrs ago.  OK after Epley maneuver   Past Surgical History:  Procedure Laterality Date  . ABDOMINAL HYSTERECTOMY    . APPENDECTOMY    . CATARACT EXTRACTION W/PHACO Right 10/12/2017   Procedure: CATARACT EXTRACTION PHACO AND INTRAOCULAR LENS PLACEMENT ([place]) RIGHT;  Surgeon: Leandrew Koyanagi, MD;  Location: [place];  Service: Ophthalmology;  Laterality: Right;  . CATARACT EXTRACTION W/PHACO Left 11/09/2017   Procedure:  CATARACT EXTRACTION PHACO AND INTRAOCULAR LENS PLACEMENT ([place]) LEFT toric;  Surgeon: Leandrew Koyanagi, MD;  Location: [place];  Service: Ophthalmology;  Laterality: Left;  . CHOLECYSTECTOMY  1990  . CYSTOSCOPY     x2  . ESOPHAGOGASTRODUODENOSCOPY (EGD) WITH PROPOFOL N/A 08/28/2015   Procedure: ESOPHAGOGASTRODUODENOSCOPY (EGD) WITH PROPOFOL;  Surgeon: Hulen Luster, MD;  Location: [facility] ENDOSCOPY;  Service: Gastroenterology;  Laterality: N/A;  . ESOPHAGOGASTRODUODENOSCOPY (EGD) WITH PROPOFOL N/A 04/04/2017   Procedure: ESOPHAGOGASTRODUODENOSCOPY (EGD) WITH PROPOFOL;  Surgeon: Lollie Sails, MD;  Location: [facility] ENDOSCOPY;  Service: Endoscopy;  Laterality: N/A;  . PARTIAL HYSTERECTOMY    . PARTIAL KNEE ARTHROPLASTY Left 04/15/2016   Procedure: UNICOMPARTMENTAL KNEE;  Surgeon: Corky Mull, MD;  Location: [facility] ORS;  Service: Orthopedics;  Laterality: Left;  . TONSILLECTOMY     Family History  Problem Relation Age of Onset  . Heart disease Father   . Heart disease Mother   . Heart disease Brother   . Heart disease Brother   . Breast cancer Neg Hx   . Colon cancer Neg Hx    Social History   Socioeconomic History  . Marital status: Married    Spouse name: Not on file  . Number of children: 2  . Years of education: Not on file  . Highest education level:  Not on file  Occupational History  . Occupation: retired  Scientific laboratory technician  . Financial resource strain: Not on file  . Food insecurity    Worry: Not on file    Inability: Not on file  . Transportation needs    Medical: Not on file    Non-medical: Not on file  Tobacco Use  . Smoking status: Never Smoker  . Smokeless tobacco: Never Used  Substance and Sexual Activity  . Alcohol use: No    Alcohol/week: 0.0 standard drinks    Comment: seldom, a glass of wine 1-3 per year.  . Drug use: No  . Sexual activity: Not Currently  Lifestyle  . Physical activity    Days per week: Not on file    Minutes per session: Not on  file  . Stress: Not on file  Relationships  . Social Herbalist on phone: Not on file    Gets together: Not on file    Attends religious service: Not on file    Active member of club or organization: Not on file    Attends meetings of clubs or organizations: Not on file    Relationship status: Not on file  Other Topics Concern  . Not on file  Social History Narrative  . Not on file    Outpatient Encounter Medications as of 07/20/2019  Medication Sig  . amitriptyline (ELAVIL) 25 MG tablet Take 25 mg by mouth at bedtime.  Marland Kitchen amLODipine (NORVASC) 10 MG tablet TAKE 1 TABLET DAILY  . aspirin 81 MG tablet Take 81 mg by mouth daily.  Marland Kitchen atorvastatin (LIPITOR) 40 MG tablet TAKE 1 TABLET DAILY  . Calcium Carbonate-Vitamin D (CALCIUM 600+D) 600-400 MG-UNIT tablet Take 1 tablet by mouth daily.  . Cholecalciferol (VITAMIN D3) 1000 units CAPS Take by mouth.  . estrogens, conjugated, (PREMARIN) 0.9 MG tablet Take 0.9 mg by mouth daily.   . metoprolol succinate (TOPROL-XL) 50 MG 24 hr tablet TAKE 1 TABLET TWICE A DAY  WITH OR IMMEDIATELY        FOLLOWING MEALS  . pantoprazole (PROTONIX) 40 MG tablet TAKE 1 TABLET DAILY  . SYNTHROID 75 MCG tablet TAKE 1 TABLET DAILY BEFORE BREAKFAST  . [DISCONTINUED] ciclopirox (PENLAC) 8 % solution Apply topically at bedtime. Apply over nail and surrounding skin. Apply daily over previous coat. After seven (7) days, may remove with alcohol and continue cycle. (Patient not taking: Reported on 11/09/2017)   No facility-administered encounter medications on file as of 07/20/2019.     Review of Systems  Constitutional: Negative for appetite change and unexpected weight change.  HENT: Negative for congestion and sinus pressure.   Eyes: Negative for pain and visual disturbance.  Respiratory: Negative for cough, chest tightness and shortness of breath.   Cardiovascular: Negative for chest pain, palpitations and leg swelling.  Gastrointestinal: Negative for  abdominal pain, diarrhea, nausea and vomiting.  Genitourinary: Negative for difficulty urinating and dysuria.  Musculoskeletal: Negative for joint swelling and myalgias.  Skin: Negative for color change and rash.  Neurological: Negative for dizziness, light-headedness and headaches.  Hematological: Negative for adenopathy. Does not bruise/bleed easily.  Psychiatric/Behavioral: Negative for agitation and dysphoric mood.       Objective:    Physical Exam Constitutional:      General: She is not in acute distress.    Appearance: Normal appearance. She is well-developed.  HENT:     Head: Normocephalic and atraumatic.     Right Ear: External ear normal.  Left Ear: External ear normal.  Eyes:     General: No scleral icterus.       Right eye: No discharge.        Left eye: No discharge.     Conjunctiva/sclera: Conjunctivae normal.  Neck:     Musculoskeletal: Neck supple. No muscular tenderness.     Thyroid: No thyromegaly.  Cardiovascular:     Rate and Rhythm: Normal rate and regular rhythm.  Pulmonary:     Effort: No tachypnea, accessory muscle usage or respiratory distress.     Breath sounds: Normal breath sounds. No decreased breath sounds or wheezing.  Chest:     Breasts:        Right: No inverted nipple, mass, nipple discharge or tenderness (no axillary adenopathy).        Left: No inverted nipple, mass, nipple discharge or tenderness (no axilarry adenopathy).  Abdominal:     General: Bowel sounds are normal.     Palpations: Abdomen is soft.     Tenderness: There is no abdominal tenderness.  Musculoskeletal:        General: No swelling or tenderness.  Lymphadenopathy:     Cervical: No cervical adenopathy.  Skin:    Findings: No erythema or rash.  Neurological:     Mental Status: She is alert and oriented to person, place, and time.  Psychiatric:        Mood and Affect: Mood normal.        Behavior: Behavior normal.     BP 128/76   Pulse 70   Temp (!) 97.1 F  (36.2 C)   Resp 16   Ht '5\' 2"'$  (1.575 m)   Wt 153 lb 9.6 oz (69.7 kg)   SpO2 98%   BMI 28.09 kg/m  Wt Readings from Last 3 Encounters:  07/20/19 153 lb 9.6 oz (69.7 kg)  08/24/18 158 lb 12.8 oz (72 kg)  02/08/18 158 lb 12.8 oz (72 kg)     Lab Results  Component Value Date   WBC 8.4 05/10/2019   HGB 13.0 05/10/2019   HCT 39.1 05/10/2019   PLT 177.0 05/10/2019   GLUCOSE 105 (H) 05/10/2019   CHOL 150 05/10/2019   TRIG 163.0 (H) 05/10/2019   HDL 69.60 05/10/2019   LDLDIRECT 64.9 12/08/2012   LDLCALC 48 05/10/2019   ALT 14 05/10/2019   AST 20 05/10/2019   NA 137 05/10/2019   K 4.4 05/10/2019   CL 104 05/10/2019   CREATININE 1.21 (H) 05/10/2019   BUN 19 05/10/2019   CO2 22 05/10/2019   TSH 2.33 05/10/2019   INR 1.03 04/07/2016   HGBA1C 5.6 05/10/2019    US Renal  Result Date: 04/30/2019 CLINICAL DATA:  85 year old female with chronic kidney disease. EXAM: RENAL / URINARY TRACT ULTRASOUND COMPLETE COMPARISON:  CT of the abdomen pelvis dated 04/19/2017 FINDINGS: Right Kidney: Renal measurements: 9.3 x 4.0 x 4.6 cm. = volume: 90 mL. There is moderate parenchyma atrophy. Normal echogenicity. No hydronephrosis or shadowing stone. There is a 1.1 x 0.9 x 1.1 cm hypoechoic structure in the upper pole of the right kidney likely corresponding to the cyst seen on the prior CT. Left Kidney: Renal measurements: 10.8 x 5.0 x 4.8 cm = volume: 135 mL. There is moderate parenchyma atrophy and cortical thinning. No hydronephrosis or shadowing stone. Bladder: Appears normal for degree of bladder distention. There is a 1.5 x 1.2 x 1.2 cm cyst in the right lobe of the liver as seen on the prior  CT. IMPRESSION: Moderate bilateral renal parenchyma atrophy. No hydronephrosis or shadowing stone. Electronically Signed   By: Anner Crete M.D.   On: 04/30/2019 20:28       Assessment & Plan:   Problem List Items Addressed This Visit    Back pain    Stable.       CKD (chronic kidney disease),  stage III    Followed by nephrology.  Avoid antiinflammatories.  Follow metabolic panel.       GERD (gastroesophageal reflux disease)    Controlled.       Health care maintenance    Physical today 07/20/19.  Colonoscopy 02/2012.  Mammogram 11/06/18 - Birads I.       Hypercholesterolemia    On lipitor.  Low cholesterol diet and exercise.  Follow  Lipid panel and liver function tests.        Hyperglycemia    Low carb diet and exercise.  Follow met b and a1c.       Hypertension    Blood pressure under good control.  Continue same medication regimen.  Follow pressures.  Follow metabolic panel.        Hypothyroidism    On thyroid replacement.  Follow tsh.           Einar Pheasant, MD

## 2019-07-28 ENCOUNTER — Encounter: Payer: Self-pay | Admitting: Internal Medicine

## 2019-07-28 NOTE — Assessment & Plan Note (Signed)
Controlled.  

## 2019-07-28 NOTE — Assessment & Plan Note (Signed)
Stable

## 2019-07-28 NOTE — Assessment & Plan Note (Signed)
Followed by nephrology.  Avoid antiinflammatories.  Follow metabolic panel.  

## 2019-07-28 NOTE — Assessment & Plan Note (Signed)
On thyroid replacement.  Follow tsh.  

## 2019-07-28 NOTE — Assessment & Plan Note (Signed)
Physical today 07/20/19.  Colonoscopy 02/2012.  Mammogram 11/06/18 - Birads I.

## 2019-07-28 NOTE — Assessment & Plan Note (Signed)
Blood pressure under good control.  Continue same medication regimen.  Follow pressures.  Follow metabolic panel.   

## 2019-07-28 NOTE — Assessment & Plan Note (Signed)
Low carb diet and exercise.  Follow met b and a1c.  

## 2019-07-28 NOTE — Assessment & Plan Note (Signed)
On lipitor.  Low cholesterol diet and exercise.  Follow  Lipid panel and liver function tests.

## 2019-09-06 DIAGNOSIS — D3617 Benign neoplasm of peripheral nerves and autonomic nervous system of trunk, unspecified: Secondary | ICD-10-CM | POA: Diagnosis not present

## 2019-09-14 ENCOUNTER — Other Ambulatory Visit: Payer: Self-pay | Admitting: Internal Medicine

## 2019-10-09 DIAGNOSIS — N390 Urinary tract infection, site not specified: Secondary | ICD-10-CM | POA: Diagnosis not present

## 2019-10-09 DIAGNOSIS — N183 Chronic kidney disease, stage 3 unspecified: Secondary | ICD-10-CM | POA: Diagnosis not present

## 2019-10-09 DIAGNOSIS — I129 Hypertensive chronic kidney disease with stage 1 through stage 4 chronic kidney disease, or unspecified chronic kidney disease: Secondary | ICD-10-CM | POA: Diagnosis not present

## 2019-10-15 ENCOUNTER — Other Ambulatory Visit: Payer: Self-pay | Admitting: Internal Medicine

## 2019-10-16 DIAGNOSIS — N1832 Chronic kidney disease, stage 3b: Secondary | ICD-10-CM | POA: Diagnosis not present

## 2019-10-16 DIAGNOSIS — I10 Essential (primary) hypertension: Secondary | ICD-10-CM | POA: Diagnosis not present

## 2019-10-16 DIAGNOSIS — Z20828 Contact with and (suspected) exposure to other viral communicable diseases: Secondary | ICD-10-CM | POA: Diagnosis not present

## 2019-10-16 DIAGNOSIS — R600 Localized edema: Secondary | ICD-10-CM | POA: Diagnosis not present

## 2019-11-19 ENCOUNTER — Telehealth: Payer: Self-pay

## 2019-11-19 ENCOUNTER — Ambulatory Visit: Payer: Medicare Other

## 2019-11-19 NOTE — Telephone Encounter (Signed)
Failed attempt to reach patient for scheduled annual wellness visit. Phone rings x2 and goes directly to busy signal. Unable to leave a message. Please reschedule as appropriate.

## 2019-11-20 ENCOUNTER — Other Ambulatory Visit: Payer: Medicare Other

## 2019-11-26 ENCOUNTER — Telehealth: Payer: Self-pay | Admitting: Internal Medicine

## 2019-11-26 DIAGNOSIS — L659 Nonscarring hair loss, unspecified: Secondary | ICD-10-CM

## 2019-11-26 NOTE — Telephone Encounter (Signed)
Please triage her for more info.

## 2019-11-26 NOTE — Telephone Encounter (Signed)
Pt called she is taking SYNTHROID 75 MCG tablet and her hair is falling out and she would like a call back

## 2019-11-26 NOTE — Telephone Encounter (Signed)
Pt hair has been gradually increase in her hair falling out. Her dermatologist gave Rogaine for pt to treat at the first of the year.  Pt is afraid that all her hair will fall out before the rx starts to work, dermatology stated that they want her to keep try to use the rx and give it time to work.  Patient has labs this Wednesday, patient wants Dr Nicki Reaper to add what ever labs PCP thinks may try and figure what is going on. Patient is very worried she will go bald. Patient would like call back after labs placed with results. Please advise.

## 2019-11-27 ENCOUNTER — Other Ambulatory Visit: Payer: Self-pay | Admitting: Internal Medicine

## 2019-11-27 NOTE — Addendum Note (Signed)
Addended by: Alisa Graff on: 11/27/2019 05:35 AM   Modules accepted: Orders

## 2019-11-27 NOTE — Telephone Encounter (Signed)
Thyroid lab, etc - were already ordered. I did add cbc.  Need to make sure lab is aware to draw all labs, not just lab ordered today.

## 2019-11-27 NOTE — Telephone Encounter (Signed)
Patient is aware. Noted on schedule to draw all labs.

## 2019-11-28 ENCOUNTER — Other Ambulatory Visit (INDEPENDENT_AMBULATORY_CARE_PROVIDER_SITE_OTHER): Payer: Medicare Other

## 2019-11-28 ENCOUNTER — Other Ambulatory Visit: Payer: Self-pay

## 2019-11-28 DIAGNOSIS — L659 Nonscarring hair loss, unspecified: Secondary | ICD-10-CM

## 2019-11-28 DIAGNOSIS — R739 Hyperglycemia, unspecified: Secondary | ICD-10-CM

## 2019-11-28 DIAGNOSIS — E7800 Pure hypercholesterolemia, unspecified: Secondary | ICD-10-CM

## 2019-11-28 DIAGNOSIS — I10 Essential (primary) hypertension: Secondary | ICD-10-CM

## 2019-11-28 LAB — HEMOGLOBIN A1C: Hgb A1c MFr Bld: 5.4 % (ref 4.6–6.5)

## 2019-11-28 LAB — CBC WITH DIFFERENTIAL/PLATELET
Basophils Absolute: 0.1 10*3/uL (ref 0.0–0.1)
Basophils Relative: 1.1 % (ref 0.0–3.0)
Eosinophils Absolute: 0.2 10*3/uL (ref 0.0–0.7)
Eosinophils Relative: 3.1 % (ref 0.0–5.0)
HCT: 38.5 % (ref 36.0–46.0)
Hemoglobin: 13.1 g/dL (ref 12.0–15.0)
Lymphocytes Relative: 17.3 % (ref 12.0–46.0)
Lymphs Abs: 1.3 10*3/uL (ref 0.7–4.0)
MCHC: 34 g/dL (ref 30.0–36.0)
MCV: 92.9 fl (ref 78.0–100.0)
Monocytes Absolute: 0.6 10*3/uL (ref 0.1–1.0)
Monocytes Relative: 7.1 % (ref 3.0–12.0)
Neutro Abs: 5.5 10*3/uL (ref 1.4–7.7)
Neutrophils Relative %: 71.4 % (ref 43.0–77.0)
Platelets: 194 10*3/uL (ref 150.0–400.0)
RBC: 4.14 Mil/uL (ref 3.87–5.11)
RDW: 12.4 % (ref 11.5–15.5)
WBC: 7.7 10*3/uL (ref 4.0–10.5)

## 2019-11-28 LAB — LIPID PANEL
Cholesterol: 139 mg/dL (ref 0–200)
HDL: 70.3 mg/dL (ref >39.00)
LDL Cholesterol: 37 mg/dL (ref 0–99)
NonHDL: 68.89
Total CHOL/HDL Ratio: 2
Triglycerides: 158 mg/dL — ABNORMAL HIGH (ref 0.0–149.0)
VLDL: 31.6 mg/dL (ref 0.0–40.0)

## 2019-11-28 LAB — BASIC METABOLIC PANEL
BUN: 17 mg/dL (ref 6–23)
CO2: 25 mEq/L (ref 19–32)
Calcium: 9.4 mg/dL (ref 8.4–10.5)
Chloride: 103 mEq/L (ref 96–112)
Creatinine, Ser: 1.24 mg/dL — ABNORMAL HIGH (ref 0.40–1.20)
GFR: 41.08 mL/min — ABNORMAL LOW (ref >60.00)
Glucose, Bld: 101 mg/dL — ABNORMAL HIGH (ref 70–99)
Potassium: 4.3 mEq/L (ref 3.5–5.1)
Sodium: 136 mEq/L (ref 135–145)

## 2019-11-28 LAB — HEPATIC FUNCTION PANEL
ALT: 10 U/L (ref 0–35)
AST: 18 U/L (ref 0–37)
Albumin: 3.8 g/dL (ref 3.5–5.2)
Alkaline Phosphatase: 78 U/L (ref 39–117)
Bilirubin, Direct: 0.2 mg/dL (ref 0.0–0.3)
Total Bilirubin: 0.5 mg/dL (ref 0.2–1.2)
Total Protein: 7.3 g/dL (ref 6.0–8.3)

## 2019-11-28 LAB — TSH: TSH: 2.96 u[IU]/mL (ref 0.35–4.50)

## 2019-12-16 ENCOUNTER — Other Ambulatory Visit: Payer: Self-pay | Admitting: Internal Medicine

## 2020-01-23 DIAGNOSIS — Z1231 Encounter for screening mammogram for malignant neoplasm of breast: Secondary | ICD-10-CM | POA: Diagnosis not present

## 2020-01-23 LAB — HM MAMMOGRAPHY

## 2020-01-25 ENCOUNTER — Other Ambulatory Visit: Payer: Self-pay

## 2020-01-25 ENCOUNTER — Encounter: Payer: Self-pay | Admitting: Internal Medicine

## 2020-01-25 ENCOUNTER — Ambulatory Visit (INDEPENDENT_AMBULATORY_CARE_PROVIDER_SITE_OTHER): Payer: Medicare Other | Admitting: Internal Medicine

## 2020-01-25 ENCOUNTER — Telehealth: Payer: Self-pay | Admitting: Internal Medicine

## 2020-01-25 DIAGNOSIS — E039 Hypothyroidism, unspecified: Secondary | ICD-10-CM | POA: Diagnosis not present

## 2020-01-25 DIAGNOSIS — M7989 Other specified soft tissue disorders: Secondary | ICD-10-CM | POA: Diagnosis not present

## 2020-01-25 DIAGNOSIS — I10 Essential (primary) hypertension: Secondary | ICD-10-CM | POA: Diagnosis not present

## 2020-01-25 DIAGNOSIS — N183 Chronic kidney disease, stage 3 unspecified: Secondary | ICD-10-CM | POA: Diagnosis not present

## 2020-01-25 DIAGNOSIS — R739 Hyperglycemia, unspecified: Secondary | ICD-10-CM

## 2020-01-25 DIAGNOSIS — E7800 Pure hypercholesterolemia, unspecified: Secondary | ICD-10-CM | POA: Diagnosis not present

## 2020-01-25 DIAGNOSIS — M545 Low back pain, unspecified: Secondary | ICD-10-CM

## 2020-01-25 DIAGNOSIS — K219 Gastro-esophageal reflux disease without esophagitis: Secondary | ICD-10-CM | POA: Diagnosis not present

## 2020-01-25 DIAGNOSIS — Z0000 Encounter for general adult medical examination without abnormal findings: Secondary | ICD-10-CM

## 2020-01-25 NOTE — Assessment & Plan Note (Signed)
Physical today 01/25/20.  Colonoscopy 02/2012 - diverticulosis.  Mammogram 01/23/20.

## 2020-01-25 NOTE — Telephone Encounter (Signed)
Pt wanted to give dates of covid vaccination Moderna 10/17/19 and the second was 11/14/19

## 2020-01-25 NOTE — Telephone Encounter (Signed)
Dates noted.

## 2020-01-25 NOTE — Patient Instructions (Signed)
[employer] blood pressure cuff

## 2020-01-25 NOTE — Progress Notes (Signed)
[name], female   DOB: 03/19/1932, 86 y.o.   MRN: [id number]   Subjective:    [name], female    DOB: August 20, 1932, 86 y.o.   MRN: [id number]  HPI This visit occurred during the SARS-CoV-2 public health emergency.  Safety protocols were in place, including screening questions prior to the visit, additional usage of staff PPE, and extensive cleaning of exam room while observing appropriate contact time as indicated for disinfecting solutions.  Patient here for a scheduled follow up.  Here to follow up regarding hypertension, CKD and lower extremity swelling. Saw nephrology 10/16/19 for f/u CKD.  Recommended torsemide '10mg'$  prn lower extremity swelling.  She has not started.  Discussed taking prn.  Swelling better today.  No chest pain or sob reported.  Some persistent back pain.  Worse in am.  Limits her activity.  Has been a persistent issue for her.  Previous xray with degenerative changes.  Request referral to see Dr Sharlet Salina.  Previously saw Dr Gustavo Lah for dysphagia.  [facility] to [facility].  Recommended to follow.  She eats slowly and takes small bites.  Stable.  No abdominal pain.  Bowels moving.  Mammogram 01/23/20 - Briads II.    Past Medical History:  Diagnosis Date  . Arthritis    lower back, hands  . Benign esophageal stricture   . Complication of anesthesia   . Diverticulosis   . Diverticulosis   . Dysphagia   . Family history of adverse reaction to anesthesia    brother had a hard time waking up with hernia surgery as a child.  Marland Kitchen GERD (gastroesophageal reflux disease)   . Headache(784.0)    h/o recurrent vascular headaches. no longer gets these  . Hypercholesterolemia   . Hypertension   . Hypothyroidism   . IBS (irritable bowel syndrome)   . Leg swelling   . Neck fullness 2016   dr. Nicki Reaper reviewed and found it to be nothing.  prinivil seemed to cause face and neck and tongue swelling  . PONV (postoperative nausea and vomiting)   . Transient  global amnesia    x1, over 25 yrs ago after migraine  . Urinary tract bacterial infections   . Vertigo    x1 - several yrs ago.  OK after Epley maneuver   Past Surgical History:  Procedure Laterality Date  . ABDOMINAL HYSTERECTOMY    . APPENDECTOMY    . CATARACT EXTRACTION W/PHACO Right 10/12/2017   Procedure: CATARACT EXTRACTION PHACO AND INTRAOCULAR LENS PLACEMENT ([place]) RIGHT;  Surgeon: Leandrew Koyanagi, MD;  Location: [place];  Service: Ophthalmology;  Laterality: Right;  . CATARACT EXTRACTION W/PHACO Left 11/09/2017   Procedure: CATARACT EXTRACTION PHACO AND INTRAOCULAR LENS PLACEMENT ([place]) LEFT toric;  Surgeon: Leandrew Koyanagi, MD;  Location: [place];  Service: Ophthalmology;  Laterality: Left;  . CHOLECYSTECTOMY  1990  . CYSTOSCOPY     x2  . ESOPHAGOGASTRODUODENOSCOPY (EGD) WITH PROPOFOL N/A 08/28/2015   Procedure: ESOPHAGOGASTRODUODENOSCOPY (EGD) WITH PROPOFOL;  Surgeon: Hulen Luster, MD;  Location: [facility] ENDOSCOPY;  Service: Gastroenterology;  Laterality: N/A;  . ESOPHAGOGASTRODUODENOSCOPY (EGD) WITH PROPOFOL N/A 04/04/2017   Procedure: ESOPHAGOGASTRODUODENOSCOPY (EGD) WITH PROPOFOL;  Surgeon: Lollie Sails, MD;  Location: [facility] ENDOSCOPY;  Service: Endoscopy;  Laterality: N/A;  . PARTIAL HYSTERECTOMY    . PARTIAL KNEE ARTHROPLASTY Left 04/15/2016   Procedure: UNICOMPARTMENTAL KNEE;  Surgeon: Corky Mull, MD;  Location: [facility];  Service: Orthopedics;  Laterality: Left;  . TONSILLECTOMY  Family History  Problem Relation Age of Onset  . Heart disease Father   . Heart disease Mother   . Heart disease Brother   . Heart disease Brother   . Breast cancer Neg Hx   . Colon cancer Neg Hx    Social History   Socioeconomic History  . Marital status: Married    Spouse name: Not on file  . Number of children: 2  . Years of education: Not on file  . Highest education level: Not on file  Occupational History  . Occupation: retired  Tobacco Use   . Smoking status: Never Smoker  . Smokeless tobacco: Never Used  Substance and Sexual Activity  . Alcohol use: No    Alcohol/week: 0.0 standard drinks    Comment: seldom, a glass of wine 1-3 per year.  . Drug use: No  . Sexual activity: Not Currently  Other Topics Concern  . Not on file  Social History Narrative  . Not on file   Social Determinants of Health   Financial Resource Strain:   . Difficulty of Paying Living Expenses:   Food Insecurity:   . Worried About Charity fundraiser in the Last Year:   . Arboriculturist in the Last Year:   Transportation Needs:   . Film/video editor (Medical):   Marland Kitchen Lack of Transportation (Non-Medical):   Physical Activity:   . Days of Exercise per Week:   . Minutes of Exercise per Session:   Stress:   . Feeling of Stress :   Social Connections:   . Frequency of Communication with Friends and Family:   . Frequency of Social Gatherings with Friends and Family:   . Attends Religious Services:   . Active Member of Clubs or Organizations:   . Attends Archivist Meetings:   Marland Kitchen Marital Status:     Outpatient Encounter Medications as of 01/25/2020  Medication Sig  . amitriptyline (ELAVIL) 25 MG tablet Take 25 mg by mouth at bedtime.  Marland Kitchen amLODipine (NORVASC) 10 MG tablet TAKE 1 TABLET DAILY  . aspirin 81 MG tablet Take 81 mg by mouth daily.  Marland Kitchen atorvastatin (LIPITOR) 40 MG tablet TAKE 1 TABLET DAILY  . Calcium Carbonate-Vitamin D (CALCIUM 600+D) 600-400 MG-UNIT tablet Take 1 tablet by mouth daily.  . Cholecalciferol (VITAMIN D3) 1000 units CAPS Take by mouth.  . estrogens, conjugated, (PREMARIN) 0.9 MG tablet Take 0.9 mg by mouth daily.   . metoprolol succinate (TOPROL-XL) 50 MG 24 hr tablet TAKE 1 TABLET TWICE A DAY  WITH OR IMMEDIATELY        FOLLOWING MEALS  . pantoprazole (PROTONIX) 40 MG tablet TAKE 1 TABLET DAILY  . SYNTHROID 75 MCG tablet TAKE 1 TABLET DAILY BEFORE BREAKFAST   No facility-administered encounter medications  on file as of 01/25/2020.    Review of Systems  Constitutional: Negative for appetite change and unexpected weight change.  HENT: Negative for congestion and sinus pressure.   Respiratory: Negative for cough, chest tightness and shortness of breath.   Cardiovascular: Negative for chest pain and palpitations.       Chronic leg swelling.  Better today.    Gastrointestinal: Negative for abdominal pain, nausea and vomiting.  Genitourinary: Negative for difficulty urinating and dysuria.  Musculoskeletal: Positive for back pain. Negative for joint swelling and myalgias.  Skin: Negative for color change and rash.  Neurological: Negative for dizziness, light-headedness and headaches.  Psychiatric/Behavioral: Negative for agitation and dysphoric mood.  Objective:    Physical Exam Vitals reviewed.  Constitutional:      General: She is not in acute distress.    Appearance: Normal appearance.  HENT:     Head: Normocephalic and atraumatic.     Right Ear: External ear normal.     Left Ear: External ear normal.  Eyes:     General: No scleral icterus.       Right eye: No discharge.        Left eye: No discharge.     Conjunctiva/sclera: Conjunctivae normal.  Neck:     Thyroid: No thyromegaly.  Cardiovascular:     Rate and Rhythm: Normal rate and regular rhythm.  Pulmonary:     Effort: No respiratory distress.     Breath sounds: Normal breath sounds. No wheezing.  Abdominal:     General: Bowel sounds are normal.     Palpations: Abdomen is soft.     Tenderness: There is no abdominal tenderness.  Musculoskeletal:        General: No tenderness.     Cervical back: Neck supple. No tenderness.     Comments: No increased swelling today.    Lymphadenopathy:     Cervical: No cervical adenopathy.  Skin:    Findings: No erythema or rash.  Neurological:     Mental Status: She is alert.  Psychiatric:        Mood and Affect: Mood normal.        Behavior: Behavior normal.     BP 138/70    Pulse 68   Temp (!) 97.4 F (36.3 C)   Resp 16   Ht '5\' 2"'$  (1.575 m)   Wt 155 lb (70.3 kg)   SpO2 98%   BMI 28.35 kg/m  Wt Readings from Last 3 Encounters:  01/25/20 155 lb (70.3 kg)  07/20/19 153 lb 9.6 oz (69.7 kg)  08/24/18 158 lb 12.8 oz (72 kg)     Lab Results  Component Value Date   WBC 7.7 11/28/2019   HGB 13.1 11/28/2019   HCT 38.5 11/28/2019   PLT 194.0 11/28/2019   GLUCOSE 101 (H) 11/28/2019   CHOL 139 11/28/2019   TRIG 158.0 (H) 11/28/2019   HDL 70.30 11/28/2019   LDLDIRECT 64.9 12/08/2012   LDLCALC 37 11/28/2019   ALT 10 11/28/2019   AST 18 11/28/2019   NA 136 11/28/2019   K 4.3 11/28/2019   CL 103 11/28/2019   CREATININE 1.24 (H) 11/28/2019   BUN 17 11/28/2019   CO2 25 11/28/2019   TSH 2.96 11/28/2019   INR 1.03 04/07/2016   HGBA1C 5.4 11/28/2019    US RENAL  Result Date: 04/30/2019 CLINICAL DATA:  86 year old female with chronic kidney disease. EXAM: RENAL / URINARY TRACT ULTRASOUND COMPLETE COMPARISON:  CT of the abdomen pelvis dated 04/19/2017 FINDINGS: Right Kidney: Renal measurements: 9.3 x 4.0 x 4.6 cm. = volume: 90 mL. There is moderate parenchyma atrophy. Normal echogenicity. No hydronephrosis or shadowing stone. There is a 1.1 x 0.9 x 1.1 cm hypoechoic structure in the upper pole of the right kidney likely corresponding to the cyst seen on the prior CT. Left Kidney: Renal measurements: 10.8 x 5.0 x 4.8 cm = volume: 135 mL. There is moderate parenchyma atrophy and cortical thinning. No hydronephrosis or shadowing stone. Bladder: Appears normal for degree of bladder distention. There is a 1.5 x 1.2 x 1.2 cm cyst in the right lobe of the liver as seen on the prior CT. IMPRESSION: Moderate bilateral renal  parenchyma atrophy. No hydronephrosis or shadowing stone. Electronically Signed   By: Anner Crete M.D.   On: 04/30/2019 20:28       Assessment & Plan:   Problem List Items Addressed This Visit    Back pain    Persistent back pain.  Worse in  am.  Request referral to Dr Sharlet Salina.        Relevant Orders   Ambulatory referral to Orthopedic Surgery   CKD (chronic kidney disease), stage III    Seeing nephrology.  Recommended torsemide prn swelling.  Stable.  Follow.        GERD (gastroesophageal reflux disease)    No acid reflux reported.  On protonix.  Some issues with dysphagia as outlined.  Has seen GI and surgery.  Follow.        Health care maintenance    Physical today 01/25/20.  Colonoscopy 02/2012 - diverticulosis.  Mammogram 01/23/20.        Hypercholesterolemia    On lipitor.  Low cholesterol diet and exercise.  Follow lipid panel and liver function tests.        Hyperglycemia    Low carb diet and exercise.  Follow met b and a1c.        Hypertension    Blood pressure as outlined.  Continues on metoprolol and amlodipine.  Follow pressures.  Follow metabolic panel.       Hypothyroidism    On thyroid replacement.  Follow tsh.       Leg swelling    Leg swelling is better today.  Intermittent - worsening.  Saw nephrology - f/u CKD 3.  Prescribed torsemide to take prn.           Einar Pheasant, MD

## 2020-01-25 NOTE — Telephone Encounter (Signed)
Pt would

## 2020-01-27 ENCOUNTER — Encounter: Payer: Self-pay | Admitting: Internal Medicine

## 2020-01-27 NOTE — Assessment & Plan Note (Signed)
Leg swelling is better today.  Intermittent - worsening.  Saw nephrology - f/u CKD 3.  Prescribed torsemide to take prn.

## 2020-01-27 NOTE — Assessment & Plan Note (Signed)
Persistent back pain.  Worse in am.  Request referral to Dr Sharlet Salina.

## 2020-01-27 NOTE — Assessment & Plan Note (Signed)
No acid reflux reported.  On protonix.  Some issues with dysphagia as outlined.  Has seen GI and surgery.  Follow.

## 2020-01-27 NOTE — Assessment & Plan Note (Signed)
Blood pressure as outlined.  Continues on metoprolol and amlodipine.  Follow pressures.  Follow metabolic panel.

## 2020-01-27 NOTE — Assessment & Plan Note (Signed)
On lipitor.  Low cholesterol diet and exercise.  Follow lipid panel and liver function tests.   

## 2020-01-27 NOTE — Assessment & Plan Note (Signed)
Low carb diet and exercise.  Follow met b and a1c.   

## 2020-01-27 NOTE — Assessment & Plan Note (Signed)
On thyroid replacement.  Follow tsh.  

## 2020-01-27 NOTE — Assessment & Plan Note (Signed)
Seeing nephrology.  Recommended torsemide prn swelling.  Stable.  Follow.

## 2020-02-08 DIAGNOSIS — Z780 Asymptomatic menopausal state: Secondary | ICD-10-CM | POA: Diagnosis not present

## 2020-02-14 ENCOUNTER — Ambulatory Visit: Payer: Medicare Other

## 2020-02-25 ENCOUNTER — Other Ambulatory Visit: Payer: Self-pay | Admitting: Physical Medicine and Rehabilitation

## 2020-02-25 DIAGNOSIS — M5136 Other intervertebral disc degeneration, lumbar region: Secondary | ICD-10-CM | POA: Diagnosis not present

## 2020-02-25 DIAGNOSIS — G8929 Other chronic pain: Secondary | ICD-10-CM | POA: Diagnosis not present

## 2020-02-25 DIAGNOSIS — M545 Low back pain: Secondary | ICD-10-CM | POA: Diagnosis not present

## 2020-02-25 DIAGNOSIS — M5442 Lumbago with sciatica, left side: Secondary | ICD-10-CM | POA: Diagnosis not present

## 2020-02-25 DIAGNOSIS — M5416 Radiculopathy, lumbar region: Secondary | ICD-10-CM

## 2020-03-03 ENCOUNTER — Other Ambulatory Visit: Payer: Self-pay | Admitting: Internal Medicine

## 2020-03-10 ENCOUNTER — Ambulatory Visit
Admission: RE | Admit: 2020-03-10 | Discharge: 2020-03-10 | Disposition: A | Discharge status: Home / Self Care | Payer: Medicare Other | Source: Ambulatory Visit | Attending: Physical Medicine and Rehabilitation | Admitting: Physical Medicine and Rehabilitation

## 2020-03-10 ENCOUNTER — Other Ambulatory Visit: Payer: Self-pay

## 2020-03-10 DIAGNOSIS — M545 Low back pain: Secondary | ICD-10-CM | POA: Diagnosis not present

## 2020-03-10 DIAGNOSIS — M5416 Radiculopathy, lumbar region: Secondary | ICD-10-CM | POA: Insufficient documentation

## 2020-04-15 DIAGNOSIS — I129 Hypertensive chronic kidney disease with stage 1 through stage 4 chronic kidney disease, or unspecified chronic kidney disease: Secondary | ICD-10-CM | POA: Insufficient documentation

## 2020-04-22 ENCOUNTER — Ambulatory Visit: Payer: Medicare Other | Admitting: Internal Medicine

## 2020-04-22 DIAGNOSIS — M5416 Radiculopathy, lumbar region: Secondary | ICD-10-CM | POA: Diagnosis not present

## 2020-04-22 DIAGNOSIS — M48062 Spinal stenosis, lumbar region with neurogenic claudication: Secondary | ICD-10-CM | POA: Diagnosis not present

## 2020-04-22 DIAGNOSIS — M5136 Other intervertebral disc degeneration, lumbar region: Secondary | ICD-10-CM | POA: Diagnosis not present

## 2020-05-07 ENCOUNTER — Telehealth (INDEPENDENT_AMBULATORY_CARE_PROVIDER_SITE_OTHER): Payer: Medicare Other | Admitting: Internal Medicine

## 2020-05-07 DIAGNOSIS — E041 Nontoxic single thyroid nodule: Secondary | ICD-10-CM | POA: Diagnosis not present

## 2020-05-07 DIAGNOSIS — M7989 Other specified soft tissue disorders: Secondary | ICD-10-CM

## 2020-05-07 DIAGNOSIS — R739 Hyperglycemia, unspecified: Secondary | ICD-10-CM

## 2020-05-07 DIAGNOSIS — E7800 Pure hypercholesterolemia, unspecified: Secondary | ICD-10-CM

## 2020-05-07 DIAGNOSIS — Z1231 Encounter for screening mammogram for malignant neoplasm of breast: Secondary | ICD-10-CM | POA: Diagnosis not present

## 2020-05-07 DIAGNOSIS — K219 Gastro-esophageal reflux disease without esophagitis: Secondary | ICD-10-CM | POA: Diagnosis not present

## 2020-05-07 DIAGNOSIS — N183 Chronic kidney disease, stage 3 unspecified: Secondary | ICD-10-CM

## 2020-05-07 DIAGNOSIS — I10 Essential (primary) hypertension: Secondary | ICD-10-CM | POA: Diagnosis not present

## 2020-05-07 DIAGNOSIS — E039 Hypothyroidism, unspecified: Secondary | ICD-10-CM | POA: Diagnosis not present

## 2020-05-07 NOTE — Progress Notes (Addendum)
[name], female   DOB: 1932-07-30, 86 y.o.   MRN: [id number]   Virtual Visit via telephone Note  This visit type was conducted due to national recommendations for restrictions regarding the COVID-19 pandemic (e.g. social distancing).  This format is felt to be most appropriate for this patient at this time.  All issues noted in this document were discussed and addressed.  No physical exam was performed (except for noted visual exam findings with Video Visits).   I connected with Elizabeth Henderson by telephone and verified that I am speaking with the correct person using two identifiers. Location patient: home Location provider: work  Persons participating in the telephone visit: patient, provider  The limitations, risks, security and privacy concerns of performing an evaluation and management service by telephone and the availability of in person appointments have been discussed.  It has also been discussed with the patient that there may be a patient responsible charge related to this service. The patient expressed understanding and agreed to proceed.   Reason for visit: scheduled follow up.   HPI: She has chronic back pain.  Seeing Dr Sharlet Salina.  S/p injection 04/22/20.  Second injection scheduled for 05/12/20.  Pain is better.  Tries to stay active.  Trying to walk.  If up on feet a lot, will notice increased swelling.  No chest pain or sob reported.  No abdominal pain.  Bowels moving.  Has f/u with nephrology end of the month.  Check pressures.  systolics averaging 562Z.  Saw Dr Howell Rucks 01/2020 - estradiol daily.     ROS: See pertinent positives and negatives per HPI.  Past Medical History:  Diagnosis Date  . Arthritis    lower back, hands  . Benign esophageal stricture   . Complication of anesthesia   . Diverticulosis   . Diverticulosis   . Dysphagia   . Family history of adverse reaction to anesthesia    brother had a hard time waking up with hernia surgery as a child.   Marland Kitchen GERD (gastroesophageal reflux disease)   . Headache(784.0)    h/o recurrent vascular headaches. no longer gets these  . Hypercholesterolemia   . Hypertension   . Hypothyroidism   . IBS (irritable bowel syndrome)   . Leg swelling   . Neck fullness 2016   dr. Nicki Reaper reviewed and found it to be nothing.  prinivil seemed to cause face and neck and tongue swelling  . PONV (postoperative nausea and vomiting)   . Transient global amnesia    x1, over 25 yrs ago after migraine  . Urinary tract bacterial infections   . Vertigo    x1 - several yrs ago.  OK after Epley maneuver    Past Surgical History:  Procedure Laterality Date  . ABDOMINAL HYSTERECTOMY    . APPENDECTOMY    . CATARACT EXTRACTION W/PHACO Right 10/12/2017   Procedure: CATARACT EXTRACTION PHACO AND INTRAOCULAR LENS PLACEMENT ([place]) RIGHT;  Surgeon: Leandrew Koyanagi, MD;  Location: [place];  Service: Ophthalmology;  Laterality: Right;  . CATARACT EXTRACTION W/PHACO Left 11/09/2017   Procedure: CATARACT EXTRACTION PHACO AND INTRAOCULAR LENS PLACEMENT ([place]) LEFT toric;  Surgeon: Leandrew Koyanagi, MD;  Location: [place];  Service: Ophthalmology;  Laterality: Left;  . CHOLECYSTECTOMY  1990  . CYSTOSCOPY     x2  . ESOPHAGOGASTRODUODENOSCOPY (EGD) WITH PROPOFOL N/A 08/28/2015   Procedure: ESOPHAGOGASTRODUODENOSCOPY (EGD) WITH PROPOFOL;  Surgeon: Hulen Luster, MD;  [facility] ENDOSCOPY;  Service: Gastroenterology;  Laterality: N/A;  .  ESOPHAGOGASTRODUODENOSCOPY (EGD) WITH PROPOFOL N/A 04/04/2017   Procedure: ESOPHAGOGASTRODUODENOSCOPY (EGD) WITH PROPOFOL;  Surgeon: Lollie Sails, MD;  Location: [facility] ENDOSCOPY;  Service: Endoscopy;  Laterality: N/A;  . PARTIAL HYSTERECTOMY    . PARTIAL KNEE ARTHROPLASTY Left 04/15/2016   Procedure: UNICOMPARTMENTAL KNEE;  Surgeon: Corky Mull, MD;  Location: [facility] OR[facility];  Service: Orthopedics;  Laterality: Left;  . TONSILLECTOMY      Family History  Problem Relation  Age of Onset  . Heart disease Father   . Heart disease Mother   . Heart disease Brother   . Heart disease Brother   . Breast cancer Neg Hx   . Colon cancer Neg Hx     SOCIAL HX: reviewed.     Current Outpatient Medications:  .  amitriptyline (ELAVIL) 25 MG tablet, Take 25 mg by mouth at bedtime., Disp: , Rfl:  .  amLODipine (NORVASC) 10 MG tablet, TAKE 1 TABLET DAILY, Disp: 90 tablet, Rfl: 1 .  aspirin 81 MG tablet, Take 81 mg by mouth daily., Disp: , Rfl:  .  atorvastatin (LIPITOR) 40 MG tablet, TAKE 1 TABLET DAILY, Disp: 90 tablet, Rfl: 3 .  Calcium Carbonate-Vitamin D (CALCIUM 600+D) 600-400 MG-UNIT tablet, Take 1 tablet by mouth daily., Disp: , Rfl:  .  Cholecalciferol (VITAMIN D3) 1000 units CAPS, Take by mouth., Disp: , Rfl:  .  estrogens, conjugated, (PREMARIN) 0.9 MG tablet, Take 0.9 mg by mouth daily. , Disp: , Rfl:  .  metoprolol succinate (TOPROL-XL) 50 MG 24 hr tablet, TAKE 1 TABLET TWICE A DAY  WITH OR IMMEDIATELY        FOLLOWING MEALS, Disp: 180 tablet, Rfl: 1 .  pantoprazole (PROTONIX) 40 MG tablet, TAKE 1 TABLET DAILY, Disp: 90 tablet, Rfl: 3 .  SYNTHROID 75 MCG tablet, TAKE 1 TABLET DAILY BEFORE BREAKFAST, Disp: 90 tablet, Rfl: 1  EXAM:  GENERAL: alert.  Sounds to be in no acute distress.  Answering questions appropriately.    PSYCH/NEURO: pleasant and cooperative, no obvious depression or anxiety, speech and thought processing grossly intact  ASSESSMENT AND PLAN:  Discussed the following assessment and plan:  Thyroid nodule Has a history of thyroid nodule.  Schedule thyroid ultrasound to confirm stable.    Hypothyroidism On thyroid replacement.  Follow tsh.   Hypertension Blood pressure as outlined.  Continue amlodipine and metoprolol.  Follow pressures.  Follow metabolic panel.   Hyperglycemia Low carb diet and exercise.  Follow met b and a1c.   Hypercholesterolemia On lipitor.  Low cholesterol diet and exercise.  Follow lipid panel and liver  function tests.    GERD (gastroesophageal reflux disease) No upper symptoms reported.  On protonix.   CKD (chronic kidney disease), stage III ([place]) Followed by nephrology.  Stable.   Leg swelling Has feet swelling if up on feet a lot.  Compression hose, leg elevation.  Follow.    Breast cancer screening Mammogram 01/23/20 - Bir[place] II.    Orders Placed This Encounter  Procedures  . Hemoglobin A1c    Standing Status:   Future    Standing Expiration Date:   05/12/2021  . Hepatic function panel    Standing Status:   Future    Standing Expiration Date:   05/12/2021  . Lipid panel    Standing Status:   Future    Standing Expiration Date:   05/12/2021  . Basic metabolic panel    Standing Status:   Future    Standing Expiration Date:   05/12/2021  I discussed the assessment and treatment plan with the patient. The patient was provided an opportunity to ask questions and all were answered. The patient agreed with the plan and demonstrated an understanding of the instructions.   The patient was advised to call back or seek an in-person evaluation if the symptoms worsen or if the condition fails to improve as anticipated.  I provided 23 minutes of non-face-to-face time during this encounter.   Einar Pheasant, MD

## 2020-05-12 ENCOUNTER — Telehealth: Payer: Self-pay | Admitting: Internal Medicine

## 2020-05-12 ENCOUNTER — Encounter: Payer: Self-pay | Admitting: Internal Medicine

## 2020-05-12 DIAGNOSIS — M5136 Other intervertebral disc degeneration, lumbar region: Secondary | ICD-10-CM | POA: Diagnosis not present

## 2020-05-12 DIAGNOSIS — Z1239 Encounter for other screening for malignant neoplasm of breast: Secondary | ICD-10-CM | POA: Insufficient documentation

## 2020-05-12 DIAGNOSIS — M5416 Radiculopathy, lumbar region: Secondary | ICD-10-CM | POA: Diagnosis not present

## 2020-05-12 DIAGNOSIS — M48062 Spinal stenosis, lumbar region with neurogenic claudication: Secondary | ICD-10-CM | POA: Diagnosis not present

## 2020-05-12 NOTE — Assessment & Plan Note (Signed)
Has feet swelling if up on feet a lot.  Compression hose, leg elevation.  Follow.

## 2020-05-12 NOTE — Assessment & Plan Note (Signed)
Low carb diet and exercise.  Follow met b and a1c.  

## 2020-05-12 NOTE — Assessment & Plan Note (Signed)
Has a history of thyroid nodule.  Schedule thyroid ultrasound to confirm stable.

## 2020-05-12 NOTE — Assessment & Plan Note (Signed)
Followed by nephrology.  Stable.   

## 2020-05-12 NOTE — Assessment & Plan Note (Signed)
No upper symptoms reported.  On protonix.   

## 2020-05-12 NOTE — Assessment & Plan Note (Signed)
Mammogram 01/23/20 - [place] II.

## 2020-05-12 NOTE — Assessment & Plan Note (Signed)
On thyroid replacement.  Follow tsh.  

## 2020-05-12 NOTE — Assessment & Plan Note (Signed)
Blood pressure as outlined.  Continue amlodipine and metoprolol.  Follow pressures.  Follow metabolic panel.  

## 2020-05-12 NOTE — Telephone Encounter (Signed)
Has a documented history of thyroid nodule.  Needs f/u thyroid ultrasound.  If agreeable, let me know and I will place order - to be scheduled.

## 2020-05-12 NOTE — Assessment & Plan Note (Signed)
On lipitor.  Low cholesterol diet and exercise.  Follow lipid panel and liver function tests.   

## 2020-05-13 DIAGNOSIS — N1832 Chronic kidney disease, stage 3b: Secondary | ICD-10-CM | POA: Diagnosis not present

## 2020-05-13 DIAGNOSIS — R821 Myoglobinuria: Secondary | ICD-10-CM | POA: Diagnosis not present

## 2020-05-14 NOTE — Telephone Encounter (Signed)
Patient agreeable to thyroid ultrasound. Scheduled for f/u appt with pcp in 3 months.

## 2020-05-15 ENCOUNTER — Other Ambulatory Visit: Payer: Self-pay | Admitting: Internal Medicine

## 2020-05-15 ENCOUNTER — Telehealth: Payer: Self-pay | Admitting: Internal Medicine

## 2020-05-15 DIAGNOSIS — E041 Nontoxic single thyroid nodule: Secondary | ICD-10-CM

## 2020-05-15 NOTE — Telephone Encounter (Signed)
Patient was returning call gave her the number on note to call for appointment

## 2020-05-15 NOTE — Progress Notes (Signed)
Order placed for thyroid ultrasound. 

## 2020-05-15 NOTE — Telephone Encounter (Signed)
Order placed for thyroid ultrasound. 

## 2020-05-15 NOTE — Telephone Encounter (Signed)
lft vm to call ofc to sch [place]. Please give pt number to call [phone] press option 3 and then 2.

## 2020-05-19 DIAGNOSIS — R600 Localized edema: Secondary | ICD-10-CM | POA: Diagnosis not present

## 2020-05-19 DIAGNOSIS — N1832 Chronic kidney disease, stage 3b: Secondary | ICD-10-CM | POA: Diagnosis not present

## 2020-05-19 DIAGNOSIS — I10 Essential (primary) hypertension: Secondary | ICD-10-CM | POA: Diagnosis not present

## 2020-05-21 ENCOUNTER — Ambulatory Visit
Admission: RE | Admit: 2020-05-21 | Discharge: 2020-05-21 | Disposition: A | Discharge status: Home / Self Care | Payer: Medicare Other | Source: Ambulatory Visit | Attending: Internal Medicine | Admitting: Internal Medicine

## 2020-05-21 ENCOUNTER — Other Ambulatory Visit: Payer: Self-pay

## 2020-05-21 DIAGNOSIS — E041 Nontoxic single thyroid nodule: Secondary | ICD-10-CM

## 2020-05-21 NOTE — Telephone Encounter (Signed)
Ok. Thank you.

## 2020-05-22 ENCOUNTER — Ambulatory Visit (INDEPENDENT_AMBULATORY_CARE_PROVIDER_SITE_OTHER): Payer: Medicare Other

## 2020-05-22 VITALS — Ht 62.0 in | Wt 158.0 lb

## 2020-05-22 DIAGNOSIS — Z0000 Encounter for general adult medical examination without abnormal findings: Secondary | ICD-10-CM

## 2020-05-22 NOTE — Progress Notes (Addendum)
Subjective:   Elizabeth Henderson is a 86 y.o. female who presents for Medicare Annual (Subsequent) preventive examination.  Review of Systems    No ROS.  Medicare Wellness Virtual Visit.   Cardiac Risk Factors include: advanced age (>56men, >44 women);hypertension     Objective:    Today's Vitals   05/22/20 1332  Weight: 158 lb (71.7 kg)  Height: 5\' 2"  (1.575 m)   Body mass index is 28.9 kg/m.  Advanced Directives 05/22/2020 11/09/2017 11/04/2017 10/12/2017 04/04/2017 11/01/2016 04/15/2016  Does Patient Have a Medical Advance Directive? No No No No No No No  Type of Advance Directive - - - - - - -  Does patient want to make changes to medical advance directive? - - - - - - -  Copy of Peters in Chart? - - - - - - -  Would patient like information on creating a medical advance directive? No - Patient declined No - Patient declined No - Patient declined No - Patient declined - No - Patient declined No - patient declined information    Current Medications (verified) Outpatient Encounter Medications as of 05/22/2020  Medication Sig  . amitriptyline (ELAVIL) 25 MG tablet Take 25 mg by mouth at bedtime.  Marland Kitchen amLODipine (NORVASC) 10 MG tablet TAKE 1 TABLET DAILY  . aspirin 81 MG tablet Take 81 mg by mouth daily.  Marland Kitchen atorvastatin (LIPITOR) 40 MG tablet TAKE 1 TABLET DAILY  . Calcium Carbonate-Vitamin D (CALCIUM 600+D) 600-400 MG-UNIT tablet Take 1 tablet by mouth daily.  . Cholecalciferol (VITAMIN D3) 1000 units CAPS Take by mouth.  . estrogens, conjugated, (PREMARIN) 0.9 MG tablet Take 0.9 mg by mouth daily.   . metoprolol succinate (TOPROL-XL) 50 MG 24 hr tablet TAKE 1 TABLET TWICE A DAY  WITH OR IMMEDIATELY        FOLLOWING MEALS  . pantoprazole (PROTONIX) 40 MG tablet TAKE 1 TABLET DAILY  . SYNTHROID 75 MCG tablet TAKE 1 TABLET DAILY BEFORE BREAKFAST   No facility-administered encounter medications on file as of 05/22/2020.    Allergies (verified) Ace inhibitors,  Prinivil [lisinopril], Floxin [ofloxacin], Mercury, Sulfa antibiotics, and Vioxx [rofecoxib]   History: Past Medical History:  Diagnosis Date  . Arthritis    lower back, hands  . Benign esophageal stricture   . Complication of anesthesia   . Diverticulosis   . Diverticulosis   . Dysphagia   . Family history of adverse reaction to anesthesia    brother had a hard time waking up with hernia surgery as a child.  Marland Kitchen GERD (gastroesophageal reflux disease)   . Headache(784.0)    h/o recurrent vascular headaches. no longer gets these  . Hypercholesterolemia   . Hypertension   . Hypothyroidism   . IBS (irritable bowel syndrome)   . Leg swelling   . Neck fullness 2016   dr. Nicki Reaper reviewed and found it to be nothing.  prinivil seemed to cause face and neck and tongue swelling  . PONV (postoperative nausea and vomiting)   . Transient global amnesia    x1, over 25 yrs ago after migraine  . Urinary tract bacterial infections   . Vertigo    x1 - several yrs ago.  OK after Epley maneuver   Past Surgical History:  Procedure Laterality Date  . ABDOMINAL HYSTERECTOMY    . APPENDECTOMY    . CATARACT EXTRACTION W/PHACO Right 10/12/2017   Procedure: CATARACT EXTRACTION PHACO AND INTRAOCULAR LENS PLACEMENT (IOC) RIGHT;  Surgeon: Wallace Going,  Nila Nephew, MD;  Location: [facility];  Service: Ophthalmology;  Laterality: Right;  . CATARACT EXTRACTION W/PHACO Left 11/09/2017   Procedure: CATARACT EXTRACTION PHACO AND INTRAOCULAR LENS PLACEMENT ([place]) LEFT toric;  Surgeon: Leandrew Koyanagi, MD;  Location: [facility];  Service: Ophthalmology;  Laterality: Left;  . CHOLECYSTECTOMY  1990  . CYSTOSCOPY     x2  . ESOPHAGOGASTRODUODENOSCOPY (EGD) WITH PROPOFOL N/A 08/28/2015   Procedure: ESOPHAGOGASTRODUODENOSCOPY (EGD) WITH PROPOFOL;  Surgeon: Hulen Luster, MD;  Location[facility] ENDOSCOPY;  Service: Gastroenterology;  Laterality: N/A;  . ESOPHAGOGASTRODUODENOSCOPY (EGD) WITH PROPOFOL N/A  04/04/2017   Procedure: ESOPHAGOGASTRODUODENOSCOPY (EGD) WITH PROPOFOL;  Surgeon: Lollie Sails, MD;  Location: [facility] ENDOSCOPY;  Service: Endoscopy;  Laterality: N/A;  . PARTIAL HYSTERECTOMY    . PARTIAL KNEE ARTHROPLASTY Left 04/15/2016   Procedure: UNICOMPARTMENTAL KNEE;  Surgeon: Corky Mull, MD;  Location: [facility];  Service: Orthopedics;  Laterality: Left;  . TONSILLECTOMY     Family History  Problem Relation Age of Onset  . Heart disease Father   . Heart disease Mother   . Heart disease Brother   . Heart disease Brother   . Breast cancer Neg Hx   . Colon cancer Neg Hx    Social History   Socioeconomic History  . Marital status: Married    Spouse name: Not on file  . Number of children: 2  . Years of education: Not on file  . Highest education level: Not on file  Occupational History  . Occupation: retired  Tobacco Use  . Smoking status: Never Smoker  . Smokeless tobacco: Never Used  Vaping Use  . Vaping Use: Never used  Substance and Sexual Activity  . Alcohol use: No    Alcohol/week: 0.0 standard drinks    Comment: seldom, a glass of wine 1-3 per year.  . Drug use: No  . Sexual activity: Not Currently  Other Topics Concern  . Not on file  Social History Narrative  . Not on file   Social Determinants of Health   Financial Resource Strain: Low Risk   . Difficulty of Paying Living Expenses: Not hard at all  Food Insecurity: No Food Insecurity  . Worried About Charity fundraiser in the Last Year: Never true  . Ran Out of Food in the Last Year: Never true  Transportation Needs: No Transportation Needs  . Lack of Transportation (Medical): No  . Lack of Transportation (Non-Medical): No  Physical Activity:   . Days of Exercise per Week: Not on file  . Minutes of Exercise per Session: Not on file  Stress: No Stress Concern Present  . Feeling of Stress : Not at all  Social Connections: Unknown  . Frequency of Communication with Friends and Family: Not on  file  . Frequency of Social Gatherings with Friends and Family: Not on file  . Attends Religious Services: Not on file  . Active Member of Clubs or Organizations: Not on file  . Attends Archivist Meetings: Not on file  . Marital Status: Married    Tobacco Counseling Counseling given: Not Answered   Clinical Intake:  Pre-visit preparation completed: Yes        Diabetes: No  How often do you need to have someone help you when you read instructions, pamphlets, or other written materials from your doctor or pharmacy?: 1 - Never  Interpreter Needed?: No      Activities of Daily Living In your present state of health, do you  have any difficulty performing the following activities: 05/22/2020  Hearing? N  Vision? N  Difficulty concentrating or making decisions? N  Walking or climbing stairs? N  Dressing or bathing? N  Doing errands, shopping? N  Preparing Food and eating ? N  Using the Toilet? N  In the past six months, have you accidently leaked urine? N  Do you have problems with loss of bowel control? N  Managing your Medications? N  Managing your Finances? N  Housekeeping or managing your Housekeeping? N  Some recent data might be hidden    Patient Care Team: Einar Pheasant, MD as PCP - General (Internal Medicine)  Indicate any recent Medical Services you may have received from other than [facility] providers in the past year (date may be approximate).     Assessment:   This is a routine wellness examination for [place].  I connected with [place] today by telephone and verified that I am speaking with the correct person using two identifiers. Location patient: home Location provider: work Persons participating in the virtual visit: patient, Marine scientist.    I discussed the limitations, risks, security and privacy concerns of performing an evaluation and management service by telephone and the availability of in person appointments. The patient expressed  understanding and verbally consented to this telephonic visit.    Interactive audio and video telecommunications were attempted between this provider and patient, however failed, due to patient having technical difficulties OR patient did not have access to video capability.  We continued and completed visit with audio only.  Some vital signs may be absent or patient reported.   Hearing/Vision screen  Hearing Screening   125Hz  250Hz  500Hz  1000Hz  2000Hz  3000Hz  4000Hz  6000Hz  8000Hz   Right ear:           Left ear:           Comments: Patient is able to hear conversational tones without difficulty.  No issues reported.  Vision Screening Comments: Wears corrective lenses Visual acuity not assessed, virtual visit.  They have seen their ophthalmologist in the last 12 months.     Dietary issues and exercise activities discussed: Current Exercise Habits: Home exercise routine, Type of exercise: walking, Intensity: Mild Healthy diet Good water up to 32 ounces Caffeine- about 2 cups  Goals    . Healthy Lifestyle     Stay hydrated! Drink plenty of fluids/water Stay active and continue home exercises. Walk as tolerated Low carb foods       Depression Screen PHQ 2/9 Scores 05/22/2020 07/20/2019 11/04/2017 11/01/2016 07/09/2016 10/27/2015 12/23/2014  PHQ - 2 Score 0 0 0 0 0 0 0    Fall Risk Fall Risk  05/22/2020 07/20/2019 11/04/2017 11/01/2016 07/09/2016  Falls in the past year? 0 0 No No No  Number falls in past yr: 0 - - - -  Follow up Falls evaluation completed Falls evaluation completed - - -   Handrails in use when climbing stairs? Yes  Home free of loose throw rugs in walkways, pet beds, electrical cords, etc? Yes  Adequate lighting in your home to reduce risk of falls? Yes   ASSISTIVE DEVICES UTILIZED TO PREVENT FALLS:  Life alert? No  Use of a cane, walker or w/c? No  Grab bars in the bathroom? No  Shower chair or bench in shower? No  Elevated toilet seat/toilet? Yes   TIMED UP  AND GO:  Was the test performed? No . Virtual visit.   Cognitive Function: Patient is alert and oriented x3.  Enjoys reading for brain health.  MMSE - Mini Mental State Exam 11/04/2017 11/01/2016 10/27/2015  Orientation to time 5 5 5   Orientation to Place 5 5 5   Registration 3 3 3   Attention/ Calculation 5 5 5   Recall 2 3 3   Language- name 2 objects 2 2 2   Language- repeat 1 1 1   Language- follow 3 step command 3 3 3   Language- read & follow direction 1 1 1   Write a sentence 1 1 1   Copy design 1 1 1   Total score 29 30 30      6CIT Screen 05/22/2020  What Year? 0 points  What month? 0 points  What time? 0 points    Immunizations Immunization History  Administered Date(s) Administered  . Influenza Split 07/17/2013  . Influenza, High Dose Seasonal PF 07/09/2016, 05/31/2017, 06/26/2018, 06/04/2019  . Influenza,inj,Quad PF,6+ Mos 06/20/2014, 07/02/2015  . Influenza-Unspecified 07/23/2012, 06/12/2013, 06/20/2014, 07/02/2015, 07/09/2016  . Moderna SARS-COVID-2 Vaccination 10/17/2019, 11/14/2019  . Pneumococcal Conjugate-13 12/17/2013  . Pneumococcal Polysaccharide-23 01/28/2017  . Zoster 12/23/2011    TDAP status: Due, Education has been provided regarding the importance of this vaccine. Advised may receive this vaccine at local pharmacy or Health Dept. Aware to provide a copy of the vaccination record if obtained from local pharmacy or Health Dept. Verbalized acceptance and understanding. Deferred.  Health Maintenance Health Maintenance  Topic Date Due  . TETANUS/TDAP  Never done  . INFLUENZA VACCINE  08/07/2020 (Originally 04/20/2020)  . MAMMOGRAM  01/22/2021  . DEXA SCAN  Completed  . COVID-19 Vaccine  Completed  . PNA vac Low Risk Adult  Completed    Dental Screening: Recommended annual dental exams for proper oral hygiene.  Community Resource Referral / Chronic Care Management: CRR required this visit?  No   CCM required this visit?  No      Plan:   Keep all  routine maintenance appointments.   Follow up 08/18/20 @ 3:00  I have personally reviewed and noted the following in the patient's chart:   . Medical and social history . Use of alcohol, tobacco or illicit drugs  . Current medications and supplements . Functional ability and status . Nutritional status . Physical activity . Advanced directives . List of other physicians . Hospitalizations, surgeries, and ER visits in previous 12 months . Vitals . Screenings to include cognitive, depression, and falls . Referrals and appointments  In addition, I have reviewed and discussed with patient certain preventive protocols, quality metrics, and best practice recommendations. A written personalized care plan for preventive services as well as general preventive health recommendations were provided to patient via mail.     Varney Biles, LPN   8/0/1655     Reviewed above information.  Agree with assessment and plan.   Dr Nicki Reaper

## 2020-05-22 NOTE — Patient Instructions (Addendum)
Elizabeth Henderson , Thank you for taking time to come for your Medicare Wellness Visit. I appreciate your ongoing commitment to your health goals. Please review the following plan we discussed and let me know if I can assist you in the future.   These are the goals we discussed: Goals    . Healthy Lifestyle     Stay hydrated! Drink plenty of fluids/water Stay active and continue home exercises. Walk as tolerated Low carb foods        This is a list of the screening recommended for you and due dates:  Health Maintenance  Topic Date Due  . Tetanus Vaccine  Never done  . Flu Shot  08/07/2020*  . Mammogram  01/22/2021  . DEXA scan (bone density measurement)  Completed  . COVID-19 Vaccine  Completed  . Pneumonia vaccines  Completed  *Topic was postponed. The date shown is not the original due date.    Immunizations Immunization History  Administered Date(s) Administered  . Influenza Split 07/17/2013  . Influenza, High Dose Seasonal PF 07/09/2016, 05/31/2017, 06/26/2018, 06/04/2019  . Influenza,inj,Quad PF,6+ Mos 06/20/2014, 07/02/2015  . Influenza-Unspecified 07/23/2012, 06/12/2013, 06/20/2014, 07/02/2015, 07/09/2016  . Moderna SARS-COVID-2 Vaccination 10/17/2019, 11/14/2019  . Pneumococcal Conjugate-13 12/17/2013  . Pneumococcal Polysaccharide-23 01/28/2017  . Zoster 12/23/2011   Keep all routine maintenance appointments.   Follow up 08/18/20 @ 3:00  Advanced directives: none completed  Conditions/risks identified: none new.  Follow up in one year for your annual wellness visit.   Preventive Care 19 Years and Older, Female Preventive care refers to lifestyle choices and visits with your health care provider that can promote health and wellness. What does preventive care include?  A yearly physical exam. This is also called an annual well check.  Dental exams once or twice a year.  Routine eye exams. Ask your health care provider how often you should have your eyes  checked.  Personal lifestyle choices, including:  Daily care of your teeth and gums.  Regular physical activity.  Eating a healthy diet.  Avoiding tobacco and drug use.  Limiting alcohol use.  Practicing safe sex.  Taking low-dose aspirin every day.  Taking vitamin and mineral supplements as recommended by your health care provider. What happens during an annual well check? The services and screenings done by your health care provider during your annual well check will depend on your age, overall health, lifestyle risk factors, and family history of disease. Counseling  Your health care provider may ask you questions about your:  Alcohol use.  Tobacco use.  Drug use.  Emotional well-being.  Home and relationship well-being.  Sexual activity.  Eating habits.  History of falls.  Memory and ability to understand (cognition).  Work and work Statistician.  Reproductive health. Screening  You may have the following tests or measurements:  Height, weight, and BMI.  Blood pressure.  Lipid and cholesterol levels. These may be checked every 5 years, or more frequently if you are over 72 years old.  Skin check.  Lung cancer screening. You may have this screening every year starting at age 45 if you have a 30-pack-year history of smoking and currently smoke or have quit within the past 15 years.  Fecal occult blood test (FOBT) of the stool. You may have this test every year starting at age 21.  Flexible sigmoidoscopy or colonoscopy. You may have a sigmoidoscopy every 5 years or a colonoscopy every 10 years starting at age 43.  Hepatitis C blood test.  Hepatitis B blood test.  Sexually transmitted disease (STD) testing.  Diabetes screening. This is done by checking your blood sugar (glucose) after you have not eaten for a while (fasting). You may have this done every 1-3 years.  Bone density scan. This is done to screen for osteoporosis. You may have this done  starting at age 58.  Mammogram. This may be done every 1-2 years. Talk to your health care provider about how often you should have regular mammograms. Talk with your health care provider about your test results, treatment options, and if necessary, the need for more tests. Vaccines  Your health care provider may recommend certain vaccines, such as:  Influenza vaccine. This is recommended every year.  Tetanus, diphtheria, and acellular pertussis (Tdap, Td) vaccine. You may need a Td booster every 10 years.  Zoster vaccine. You may need this after age 33.  Pneumococcal 13-valent conjugate (PCV13) vaccine. One dose is recommended after age 39.  Pneumococcal polysaccharide (PPSV23) vaccine. One dose is recommended after age 84. Talk to your health care provider about which screenings and vaccines you need and how often you need them. This information is not intended to replace advice given to you by your health care provider. Make sure you discuss any questions you have with your health care provider. Document Released: 10/03/2015 Document Revised: 05/26/2016 Document Reviewed: 07/08/2015 Elsevier Interactive Patient Education  2017 [place] Prevention in the Home Falls can cause injuries. They can happen to people of all ages. There are many things you can do to make your home safe and to help prevent falls. What can I do on the outside of my home?  Regularly fix the edges of walkways and driveways and fix any cracks.  Remove anything that might make you trip as you walk through a door, such as a raised step or threshold.  Trim any bushes or trees on the path to your home.  Use bright outdoor lighting.  Clear any walking paths of anything that might make someone trip, such as rocks or tools.  Regularly check to see if handrails are loose or broken. Make sure that both sides of any steps have handrails.  Any raised decks and porches should have guardrails on the  edges.  Have any leaves, snow, or ice cleared regularly.  Use sand or salt on walking paths during winter.  Clean up any spills in your garage right away. This includes oil or grease spills. What can I do in the bathroom?  Use night lights.  Install grab bars by the toilet and in the tub and shower. Do not use towel bars as grab bars.  Use non-skid mats or decals in the tub or shower.  If you need to sit down in the shower, use a plastic, non-slip stool.  Keep the floor dry. Clean up any water that spills on the floor as soon as it happens.  Remove soap buildup in the tub or shower regularly.  Attach bath mats securely with double-sided non-slip rug tape.  Do not have throw rugs and other things on the floor that can make you trip. What can I do in the bedroom?  Use night lights.  Make sure that you have a light by your bed that is easy to reach.  Do not use any sheets or blankets that are too big for your bed. They should not hang down onto the floor.  Have a firm chair that has side arms. You can use this  for support while you get dressed.  Do not have throw rugs and other things on the floor that can make you trip. What can I do in the kitchen?  Clean up any spills right away.  Avoid walking on wet floors.  Keep items that you use a lot in easy-to-reach places.  If you need to reach something above you, use a strong step stool that has a grab bar.  Keep electrical cords out of the way.  Do not use floor polish or wax that makes floors slippery. If you must use wax, use non-skid floor wax.  Do not have throw rugs and other things on the floor that can make you trip. What can I do with my stairs?  Do not leave any items on the stairs.  Make sure that there are handrails on both sides of the stairs and use them. Fix handrails that are broken or loose. Make sure that handrails are as long as the stairways.  Check any carpeting to make sure that it is firmly  attached to the stairs. Fix any carpet that is loose or worn.  Avoid having throw rugs at the top or bottom of the stairs. If you do have throw rugs, attach them to the floor with carpet tape.  Make sure that you have a light switch at the top of the stairs and the bottom of the stairs. If you do not have them, ask someone to add them for you. What else can I do to help prevent falls?  Wear shoes that:  Do not have high heels.  Have rubber bottoms.  Are comfortable and fit you well.  Are closed at the toe. Do not wear sandals.  If you use a stepladder:  Make sure that it is fully opened. Do not climb a closed stepladder.  Make sure that both sides of the stepladder are locked into place.  Ask someone to hold it for you, if possible.  Clearly mark and make sure that you can see:  Any grab bars or handrails.  First and last steps.  Where the edge of each step is.  Use tools that help you move around (mobility aids) if they are needed. These include:  Canes.  Walkers.  Scooters.  Crutches.  Turn on the lights when you go into a dark area. Replace any light bulbs as soon as they burn out.  Set up your furniture so you have a clear path. Avoid moving your furniture around.  If any of your floors are uneven, fix them.  If there are any pets around you, be aware of where they are.  Review your medicines with your doctor. Some medicines can make you feel dizzy. This can increase your chance of falling. Ask your doctor what other things that you can do to help prevent falls. This information is not intended to replace advice given to you by your health care provider. Make sure you discuss any questions you have with your health care provider. Document Released: 07/03/2009 Document Revised: 02/12/2016 Document Reviewed: 10/11/2014 Elsevier Interactive Patient Education  2017 [employer].

## 2020-05-25 ENCOUNTER — Other Ambulatory Visit: Payer: Self-pay | Admitting: Internal Medicine

## 2020-06-02 DIAGNOSIS — M5416 Radiculopathy, lumbar region: Secondary | ICD-10-CM | POA: Diagnosis not present

## 2020-06-02 DIAGNOSIS — M5136 Other intervertebral disc degeneration, lumbar region: Secondary | ICD-10-CM | POA: Diagnosis not present

## 2020-06-02 DIAGNOSIS — M47816 Spondylosis without myelopathy or radiculopathy, lumbar region: Secondary | ICD-10-CM | POA: Diagnosis not present

## 2020-06-02 DIAGNOSIS — M48062 Spinal stenosis, lumbar region with neurogenic claudication: Secondary | ICD-10-CM | POA: Diagnosis not present

## 2020-06-02 DIAGNOSIS — G8929 Other chronic pain: Secondary | ICD-10-CM | POA: Diagnosis not present

## 2020-06-02 DIAGNOSIS — M5442 Lumbago with sciatica, left side: Secondary | ICD-10-CM | POA: Diagnosis not present

## 2020-06-23 ENCOUNTER — Other Ambulatory Visit: Payer: Self-pay | Admitting: Internal Medicine

## 2020-07-18 DIAGNOSIS — H02051 Trichiasis without entropian right upper eyelid: Secondary | ICD-10-CM | POA: Diagnosis not present

## 2020-07-18 DIAGNOSIS — H26492 Other secondary cataract, left eye: Secondary | ICD-10-CM | POA: Diagnosis not present

## 2020-08-01 ENCOUNTER — Other Ambulatory Visit: Payer: Self-pay

## 2020-08-01 ENCOUNTER — Ambulatory Visit (INDEPENDENT_AMBULATORY_CARE_PROVIDER_SITE_OTHER): Payer: Medicare Other

## 2020-08-01 DIAGNOSIS — Z23 Encounter for immunization: Secondary | ICD-10-CM

## 2020-08-18 ENCOUNTER — Ambulatory Visit: Payer: 59 | Admitting: Internal Medicine

## 2020-08-22 ENCOUNTER — Other Ambulatory Visit: Payer: Self-pay

## 2020-08-27 ENCOUNTER — Encounter: Payer: Self-pay | Admitting: Internal Medicine

## 2020-08-27 ENCOUNTER — Ambulatory Visit (INDEPENDENT_AMBULATORY_CARE_PROVIDER_SITE_OTHER): Payer: Medicare Other | Admitting: Internal Medicine

## 2020-08-27 ENCOUNTER — Other Ambulatory Visit: Payer: Self-pay

## 2020-08-27 DIAGNOSIS — E7800 Pure hypercholesterolemia, unspecified: Secondary | ICD-10-CM

## 2020-08-27 DIAGNOSIS — K5900 Constipation, unspecified: Secondary | ICD-10-CM

## 2020-08-27 DIAGNOSIS — I10 Essential (primary) hypertension: Secondary | ICD-10-CM

## 2020-08-27 DIAGNOSIS — M545 Low back pain, unspecified: Secondary | ICD-10-CM | POA: Diagnosis not present

## 2020-08-27 DIAGNOSIS — R739 Hyperglycemia, unspecified: Secondary | ICD-10-CM | POA: Diagnosis not present

## 2020-08-27 DIAGNOSIS — E039 Hypothyroidism, unspecified: Secondary | ICD-10-CM | POA: Diagnosis not present

## 2020-08-27 DIAGNOSIS — N183 Chronic kidney disease, stage 3 unspecified: Secondary | ICD-10-CM | POA: Diagnosis not present

## 2020-08-27 DIAGNOSIS — K219 Gastro-esophageal reflux disease without esophagitis: Secondary | ICD-10-CM | POA: Diagnosis not present

## 2020-08-27 LAB — HEPATIC FUNCTION PANEL
ALT: 18 U/L (ref 0–35)
AST: 22 U/L (ref 0–37)
Albumin: 4.4 g/dL (ref 3.5–5.2)
Alkaline Phosphatase: 100 U/L (ref 39–117)
Bilirubin, Direct: 0.2 mg/dL (ref 0.0–0.3)
Total Bilirubin: 0.8 mg/dL (ref 0.2–1.2)
Total Protein: 7.2 g/dL (ref 6.0–8.3)

## 2020-08-27 LAB — LIPID PANEL
Cholesterol: 150 mg/dL (ref 0–200)
HDL: 61.7 mg/dL (ref >39.00)
LDL Cholesterol: 54 mg/dL (ref 0–99)
NonHDL: 88.38
Total CHOL/HDL Ratio: 2
Triglycerides: 170 mg/dL — ABNORMAL HIGH (ref 0.0–149.0)
VLDL: 34 mg/dL (ref 0.0–40.0)

## 2020-08-27 LAB — HEMOGLOBIN A1C: Hgb A1c MFr Bld: 5.4 % (ref 4.6–6.5)

## 2020-08-27 LAB — BASIC METABOLIC PANEL
BUN: 25 mg/dL — ABNORMAL HIGH (ref 6–23)
CO2: 28 mEq/L (ref 19–32)
Calcium: 9.6 mg/dL (ref 8.4–10.5)
Chloride: 99 mEq/L (ref 96–112)
Creatinine, Ser: 1.47 mg/dL — ABNORMAL HIGH (ref 0.40–1.20)
GFR: 32.22 mL/min — ABNORMAL LOW (ref >60.00)
Glucose, Bld: 99 mg/dL (ref 70–99)
Potassium: 4.8 mEq/L (ref 3.5–5.1)
Sodium: 135 mEq/L (ref 135–145)

## 2020-08-27 NOTE — Progress Notes (Signed)
[name], female   DOB: 10/21/1931, 86 y.o.   MRN: [id number]   Subjective:    [name], female    DOB: 1932-06-15, 86 y.o.   MRN: [id number]  HPI This visit occurred during the SARS-CoV-2 public health emergency.  Safety protocols were in place, including screening questions prior to the visit, additional usage of staff PPE, and extensive cleaning of exam room while observing appropriate contact time as indicated for disinfecting solutions.  Patient here for a scheduled follow up. She has a history of chronic low back pain.  Seeing Dr Sharlet Salina - s/p epidural steroid injections.  Has helped.  Second injection helped more than first.  Standing aggravates.  Overall stable.  Wants to monitor for now.  No chest pain or sob reported.  No cough or congestion reported.  No abdominal pain reported.  Some constipation - discussed miralax.     Past Medical History:  Diagnosis Date  . Arthritis    lower back, hands  . Benign esophageal stricture   . Complication of anesthesia   . Diverticulosis   . Diverticulosis   . Dysphagia   . Family history of adverse reaction to anesthesia    brother had a hard time waking up with hernia surgery as a child.  Marland Kitchen GERD (gastroesophageal reflux disease)   . Headache(784.0)    h/o recurrent vascular headaches. no longer gets these  . Hypercholesterolemia   . Hypertension   . Hypothyroidism   . IBS (irritable bowel syndrome)   . Leg swelling   . Neck fullness 2016   dr. Nicki Reaper reviewed and found it to be nothing.  prinivil seemed to cause face and neck and tongue swelling  . PONV (postoperative nausea and vomiting)   . Transient global amnesia    x1, over 25 yrs ago after migraine  . Urinary tract bacterial infections   . Vertigo    x1 - several yrs ago.  OK after Epley maneuver   Past Surgical History:  Procedure Laterality Date  . ABDOMINAL HYSTERECTOMY    . APPENDECTOMY    . CATARACT EXTRACTION W/PHACO Right  10/12/2017   Procedure: CATARACT EXTRACTION PHACO AND INTRAOCULAR LENS PLACEMENT ([place]) RIGHT;  Surgeon: Leandrew Koyanagi, MD;  Location: [place];  Service: Ophthalmology;  Laterality: Right;  . CATARACT EXTRACTION W/PHACO Left 11/09/2017   Procedure: CATARACT EXTRACTION PHACO AND INTRAOCULAR LENS PLACEMENT ([place]) LEFT toric;  Surgeon: Leandrew Koyanagi, MD;  Location: [place];  Service: Ophthalmology;  Laterality: Left;  . CHOLECYSTECTOMY  1990  . CYSTOSCOPY     x2  . ESOPHAGOGASTRODUODENOSCOPY (EGD) WITH PROPOFOL N/A 08/28/2015   Procedure: ESOPHAGOGASTRODUODENOSCOPY (EGD) WITH PROPOFOL;  Surgeon: Hulen Luster, MD;  [facility] ENDOSCOPY;  Service: Gastroenterology;  Laterality: N/A;  . ESOPHAGOGASTRODUODENOSCOPY (EGD) WITH PROPOFOL N/A 04/04/2017   Procedure: ESOPHAGOGASTRODUODENOSCOPY (EGD) WITH PROPOFOL;  Surgeon: Lollie Sails, MD;  Location: [facility] ENDOSCOPY;  Service: Endoscopy;  Laterality: N/A;  . PARTIAL HYSTERECTOMY    . PARTIAL KNEE ARTHROPLASTY Left 04/15/2016   Procedure: UNICOMPARTMENTAL KNEE;  Surgeon: Corky Mull, MD;  Location: [facility];  Service: Orthopedics;  Laterality: Left;  . TONSILLECTOMY     Family History  Problem Relation Age of Onset  . Heart disease Father   . Heart disease Mother   . Heart disease Brother   . Heart disease Brother   . Breast cancer Neg Hx   . Colon cancer Neg Hx    Social History  Socioeconomic History  . Marital status: Married    Spouse name: Not on file  . Number of children: 2  . Years of education: Not on file  . Highest education level: Not on file  Occupational History  . Occupation: retired  Tobacco Use  . Smoking status: Never Smoker  . Smokeless tobacco: Never Used  Vaping Use  . Vaping Use: Never used  Substance and Sexual Activity  . Alcohol use: No    Alcohol/week: 0.0 standard drinks    Comment: seldom, a glass of wine 1-3 per year.  . Drug use: No  . Sexual activity: Not Currently   Other Topics Concern  . Not on file  Social History Narrative  . Not on file   Social Determinants of Health   Financial Resource Strain: Low Risk   . Difficulty of Paying Living Expenses: Not hard at all  Food Insecurity: No Food Insecurity  . Worried About Charity fundraiser in the Last Year: Never true  . Ran Out of Food in the Last Year: Never true  Transportation Needs: No Transportation Needs  . Lack of Transportation (Medical): No  . Lack of Transportation (Non-Medical): No  Physical Activity: Not on file  Stress: No Stress Concern Present  . Feeling of Stress : Not at all  Social Connections: Unknown  . Frequency of Communication with Friends and Family: Not on file  . Frequency of Social Gatherings with Friends and Family: Not on file  . Attends Religious Services: Not on file  . Active Member of Clubs or Organizations: Not on file  . Attends Archivist Meetings: Not on file  . Marital Status: Married    Outpatient Encounter Medications as of 08/27/2020  Medication Sig  . amitriptyline (ELAVIL) 25 MG tablet Take 25 mg by mouth at bedtime.  Marland Kitchen amLODipine (NORVASC) 10 MG tablet TAKE 1 TABLET DAILY  . aspirin 81 MG tablet Take 81 mg by mouth daily.  Marland Kitchen atorvastatin (LIPITOR) 40 MG tablet TAKE 1 TABLET DAILY  . Calcium Carbonate-Vitamin D (CALCIUM 600+D) 600-400 MG-UNIT tablet Take 1 tablet by mouth daily.  . Cholecalciferol (VITAMIN D3) 1000 units CAPS Take by mouth.  . estrogens, conjugated, (PREMARIN) 0.9 MG tablet Take 0.9 mg by mouth daily.   . metoprolol succinate (TOPROL-XL) 50 MG 24 hr tablet TAKE 1 TABLET TWICE A DAY  WITH OR IMMEDIATELY        FOLLOWING MEALS  . pantoprazole (PROTONIX) 40 MG tablet TAKE 1 TABLET DAILY  . SYNTHROID 75 MCG tablet TAKE 1 TABLET DAILY BEFORE BREAKFAST   No facility-administered encounter medications on file as of 08/27/2020.    Review of Systems  Constitutional: Negative for appetite change and unexpected weight change.   HENT: Negative for congestion and sinus pressure.   Respiratory: Negative for cough, chest tightness and shortness of breath.   Cardiovascular: Negative for chest pain, palpitations and leg swelling.  Gastrointestinal: Negative for abdominal pain, diarrhea, nausea and vomiting.  Genitourinary: Negative for difficulty urinating and dysuria.  Musculoskeletal: Positive for back pain. Negative for joint swelling and myalgias.  Neurological: Negative for dizziness, light-headedness and headaches.  Psychiatric/Behavioral: Negative for agitation and dysphoric mood.       Objective:    Physical Exam Vitals reviewed.  Constitutional:      General: She is not in acute distress.    Appearance: Normal appearance.  HENT:     Head: Normocephalic and atraumatic.     Right Ear: External ear normal.  Left Ear: External ear normal.     Mouth/Throat:     Mouth: Oropharynx is clear and moist.  Eyes:     General: No scleral icterus.       Right eye: No discharge.        Left eye: No discharge.     Conjunctiva/sclera: Conjunctivae normal.  Neck:     Thyroid: No thyromegaly.  Cardiovascular:     Rate and Rhythm: Normal rate and regular rhythm.  Pulmonary:     Effort: No respiratory distress.     Breath sounds: Normal breath sounds. No wheezing.  Abdominal:     General: Bowel sounds are normal.     Palpations: Abdomen is soft.     Tenderness: There is no abdominal tenderness.  Musculoskeletal:        General: No swelling, tenderness or edema.     Cervical back: Neck supple. No tenderness.  Lymphadenopathy:     Cervical: No cervical adenopathy.  Skin:    Findings: No erythema or rash.  Neurological:     Mental Status: She is alert.  Psychiatric:        Mood and Affect: Mood normal.        Behavior: Behavior normal.     BP 136/64   Pulse 79   Temp 98.1 F (36.7 C)   Ht 5' 2.01" (1.575 m)   Wt 158 lb 9.6 oz (71.9 kg)   SpO2 96%   BMI 29.00 kg/m  Wt Readings from Last 3  Encounters:  08/27/20 158 lb 9.6 oz (71.9 kg)  05/22/20 158 lb (71.7 kg)  05/07/20 162 lb (73.5 kg)     Lab Results  Component Value Date   WBC 7.7 11/28/2019   HGB 13.1 11/28/2019   HCT 38.5 11/28/2019   PLT 194.0 11/28/2019   GLUCOSE 99 08/27/2020   CHOL 150 08/27/2020   TRIG 170.0 (H) 08/27/2020   HDL 61.70 08/27/2020   LDLDIRECT 64.9 12/08/2012   LDLCALC 54 08/27/2020   ALT 18 08/27/2020   AST 22 08/27/2020   NA 135 08/27/2020   K 4.8 08/27/2020   CL 99 08/27/2020   CREATININE 1.47 (H) 08/27/2020   BUN 25 (H) 08/27/2020   CO2 28 08/27/2020   TSH 2.96 11/28/2019   INR 1.03 04/07/2016   HGBA1C 5.4 08/27/2020    US THYROID  Result Date: 05/24/2020 CLINICAL DATA:  Goiter. EXAM: THYROID ULTRASOUND TECHNIQUE: Ultrasound examination of the thyroid gland and adjacent soft tissues was performed. COMPARISON:  Prior thyroid ultrasound 09/18/2018 FINDINGS: Parenchymal Echotexture: Mildly heterogenous Isthmus: 0.2 cm Right lobe: 3.0 x 1.3 x 1.2 cm Left lobe: 2.6 x 0.7 x 0.8 cm _________________________________________________________ Estimated total number of nodules >/= 1 cm: 0 Number of spongiform nodules >/=  2 cm not described below (TR1): 0 Number of mixed cystic and solid nodules >/= 1.5 cm not described below ([place]): 0 _________________________________________________________ No thyroid nodules of consequence. Incidental note is made of a tiny 0.5 cm hypoechoic nodule in the right inferior gland. This is considered an incidental finding and requires no further follow-up. Furthermore, the tiny lesion is unchanged compared to prior imaging from 2019. IMPRESSION: Small and mildly heterogeneous thyroid gland. No thyroid nodules of consequence identified. The above is in keeping with the ACR TI-RADS recommendations - J Am Coll Radiol 2017;14:587-595. Electronically Signed   By: Jacqulynn Cadet M.D.   On: 05/24/2020 14:35       Assessment & Plan:   Problem List Items Addressed This  Visit    Hypothyroidism    Recent thyroid ultrasound - no significant thyroid nodules noted.  On thyroid replacement.  Follow tsh.       Hypertension    Blood pressure as outlined.  Continue amlodipine and metoprolol.  Follow pressures.  Follow metabolic panel.       Hyperglycemia    Low carb diet and exercise.  Follow met b and a1c.       Hypercholesterolemia    On lipitor.  Low cholesterol diet and exercise.  Follow lipid panel and liver function tests.        GERD (gastroesophageal reflux disease)    No upper symptoms reported.  On protonix.       Constipation    Discussed miralax.       CKD (chronic kidney disease), stage III ([place])    Followed by nephrology.  Avoid antiinflammatories.  Follow met b       Back pain    Seeing Dr Sharlet Salina.  S/p epidural injections.  Has helped.  Follow.         Other Visit Diagnoses    Essential hypertension           Einar Pheasant, MD

## 2020-08-28 ENCOUNTER — Other Ambulatory Visit: Payer: Self-pay | Admitting: Internal Medicine

## 2020-08-28 DIAGNOSIS — Z23 Encounter for immunization: Secondary | ICD-10-CM | POA: Diagnosis not present

## 2020-08-28 DIAGNOSIS — R944 Abnormal results of kidney function studies: Secondary | ICD-10-CM

## 2020-08-28 NOTE — Progress Notes (Signed)
Order placed for f/u met b 

## 2020-08-29 DIAGNOSIS — H26492 Other secondary cataract, left eye: Secondary | ICD-10-CM | POA: Diagnosis not present

## 2020-09-01 ENCOUNTER — Encounter: Payer: Self-pay | Admitting: Internal Medicine

## 2020-09-01 ENCOUNTER — Other Ambulatory Visit: Payer: Self-pay | Admitting: Internal Medicine

## 2020-09-01 DIAGNOSIS — K5900 Constipation, unspecified: Secondary | ICD-10-CM | POA: Insufficient documentation

## 2020-09-01 NOTE — Assessment & Plan Note (Signed)
Low carb diet and exercise.  Follow met b and a1c.  

## 2020-09-01 NOTE — Assessment & Plan Note (Signed)
Blood pressure as outlined.  Continue amlodipine and metoprolol.  Follow pressures.  Follow metabolic panel.

## 2020-09-01 NOTE — Assessment & Plan Note (Signed)
Discussed miralax.

## 2020-09-01 NOTE — Assessment & Plan Note (Signed)
Followed by nephrology.  Avoid antiinflammatories.  Follow met b

## 2020-09-01 NOTE — Assessment & Plan Note (Signed)
Recent thyroid ultrasound - no significant thyroid nodules noted.  On thyroid replacement.  Follow tsh.

## 2020-09-01 NOTE — Assessment & Plan Note (Signed)
No upper symptoms reported.  On protonix.   

## 2020-09-01 NOTE — Assessment & Plan Note (Signed)
On lipitor.  Low cholesterol diet and exercise.  Follow lipid panel and liver function tests.   

## 2020-09-01 NOTE — Assessment & Plan Note (Signed)
Seeing Dr Sharlet Salina.  S/p epidural injections.  Has helped.  Follow.

## 2020-09-08 ENCOUNTER — Other Ambulatory Visit: Payer: Self-pay

## 2020-09-08 ENCOUNTER — Other Ambulatory Visit (INDEPENDENT_AMBULATORY_CARE_PROVIDER_SITE_OTHER): Payer: Medicare Other

## 2020-09-08 DIAGNOSIS — R944 Abnormal results of kidney function studies: Secondary | ICD-10-CM

## 2020-09-08 LAB — BASIC METABOLIC PANEL
BUN: 23 mg/dL (ref 6–23)
CO2: 26 mEq/L (ref 19–32)
Calcium: 9.4 mg/dL (ref 8.4–10.5)
Chloride: 101 mEq/L (ref 96–112)
Creatinine, Ser: 1.37 mg/dL — ABNORMAL HIGH (ref 0.40–1.20)
GFR: 35.06 mL/min — ABNORMAL LOW (ref >60.00)
Glucose, Bld: 98 mg/dL (ref 70–99)
Potassium: 4.5 mEq/L (ref 3.5–5.1)
Sodium: 137 mEq/L (ref 135–145)

## 2020-09-10 DIAGNOSIS — L650 Telogen effluvium: Secondary | ICD-10-CM | POA: Diagnosis not present

## 2020-09-29 DIAGNOSIS — H02051 Trichiasis without entropian right upper eyelid: Secondary | ICD-10-CM | POA: Diagnosis not present

## 2020-10-02 ENCOUNTER — Other Ambulatory Visit: Payer: Self-pay | Admitting: Internal Medicine

## 2020-10-28 ENCOUNTER — Telehealth: Payer: Self-pay

## 2020-10-28 MED ORDER — PANTOPRAZOLE SODIUM 40 MG PO TBEC: 40.0000 mg | DELAYED_RELEASE_TABLET | Freq: Every day | ORAL | 3 refills | Status: DC

## 2020-10-28 NOTE — Telephone Encounter (Signed)
Pt needs a refill on pantoprazole (PROTONIX) 40 MG tablet sent to [facility]

## 2020-11-17 DIAGNOSIS — N1832 Chronic kidney disease, stage 3b: Secondary | ICD-10-CM | POA: Diagnosis not present

## 2020-11-21 ENCOUNTER — Other Ambulatory Visit: Payer: Self-pay | Admitting: Internal Medicine

## 2020-11-24 DIAGNOSIS — R600 Localized edema: Secondary | ICD-10-CM | POA: Diagnosis not present

## 2020-11-24 DIAGNOSIS — I10 Essential (primary) hypertension: Secondary | ICD-10-CM | POA: Diagnosis not present

## 2020-11-24 DIAGNOSIS — N1832 Chronic kidney disease, stage 3b: Secondary | ICD-10-CM | POA: Diagnosis not present

## 2020-12-22 ENCOUNTER — Telehealth: Payer: Self-pay | Admitting: *Deleted

## 2020-12-22 ENCOUNTER — Telehealth: Payer: Self-pay | Admitting: Internal Medicine

## 2020-12-22 DIAGNOSIS — N183 Chronic kidney disease, stage 3 unspecified: Secondary | ICD-10-CM

## 2020-12-22 DIAGNOSIS — E7800 Pure hypercholesterolemia, unspecified: Secondary | ICD-10-CM

## 2020-12-22 DIAGNOSIS — E039 Hypothyroidism, unspecified: Secondary | ICD-10-CM

## 2020-12-22 DIAGNOSIS — R739 Hyperglycemia, unspecified: Secondary | ICD-10-CM

## 2020-12-22 DIAGNOSIS — I10 Essential (primary) hypertension: Secondary | ICD-10-CM

## 2020-12-22 MED ORDER — AMITRIPTYLINE HCL 25 MG PO TABS: 25.0000 mg | ORAL_TABLET | Freq: Every day | ORAL | 1 refills | Status: DC

## 2020-12-22 NOTE — Telephone Encounter (Signed)
Left detailed message for patient.

## 2020-12-22 NOTE — Telephone Encounter (Signed)
I have sent in the prescription for amitriptyline - to mail order.

## 2020-12-22 NOTE — Telephone Encounter (Signed)
I called to reschedule patients appt she said that she needs Dr. Nicki Reaper to start filling the  amitriptyline (ELAVIL) 25 MG tablet it is just becoming to much for them to diver to [facility]. She is almost out of medication and needs this sent to [facility]

## 2020-12-22 NOTE — Addendum Note (Signed)
Addended by: Alisa Graff on: 12/22/2020 02:04 PM   Modules accepted: Orders

## 2020-12-22 NOTE — Telephone Encounter (Signed)
Are you okay to take over filling her amitriptyline?

## 2020-12-22 NOTE — Telephone Encounter (Signed)
rx sent in for amitriptyline #90 with one refill.

## 2020-12-22 NOTE — Telephone Encounter (Signed)
Please place future orders for lab appt.  

## 2020-12-23 NOTE — Telephone Encounter (Signed)
Orders placed for f/u labs.  

## 2020-12-24 ENCOUNTER — Other Ambulatory Visit (INDEPENDENT_AMBULATORY_CARE_PROVIDER_SITE_OTHER): Payer: Medicare Other

## 2020-12-24 ENCOUNTER — Other Ambulatory Visit: Payer: Self-pay

## 2020-12-24 DIAGNOSIS — E039 Hypothyroidism, unspecified: Secondary | ICD-10-CM

## 2020-12-24 DIAGNOSIS — E7800 Pure hypercholesterolemia, unspecified: Secondary | ICD-10-CM | POA: Diagnosis not present

## 2020-12-24 DIAGNOSIS — R739 Hyperglycemia, unspecified: Secondary | ICD-10-CM | POA: Diagnosis not present

## 2020-12-24 DIAGNOSIS — I10 Essential (primary) hypertension: Secondary | ICD-10-CM

## 2020-12-24 LAB — BASIC METABOLIC PANEL
BUN: 23 mg/dL (ref 6–23)
CO2: 27 mEq/L (ref 19–32)
Calcium: 9.5 mg/dL (ref 8.4–10.5)
Chloride: 102 mEq/L (ref 96–112)
Creatinine, Ser: 1.36 mg/dL — ABNORMAL HIGH (ref 0.40–1.20)
GFR: 35.29 mL/min — ABNORMAL LOW (ref >60.00)
Glucose, Bld: 105 mg/dL — ABNORMAL HIGH (ref 70–99)
Potassium: 4.6 mEq/L (ref 3.5–5.1)
Sodium: 139 mEq/L (ref 135–145)

## 2020-12-24 LAB — HEPATIC FUNCTION PANEL
ALT: 12 U/L (ref 0–35)
AST: 16 U/L (ref 0–37)
Albumin: 4.1 g/dL (ref 3.5–5.2)
Alkaline Phosphatase: 105 U/L (ref 39–117)
Bilirubin, Direct: 0.1 mg/dL (ref 0.0–0.3)
Total Bilirubin: 0.7 mg/dL (ref 0.2–1.2)
Total Protein: 6.8 g/dL (ref 6.0–8.3)

## 2020-12-24 LAB — TSH: TSH: 2.52 u[IU]/mL (ref 0.35–4.50)

## 2020-12-24 LAB — CBC WITH DIFFERENTIAL/PLATELET
Basophils Absolute: 0.1 10*3/uL (ref 0.0–0.1)
Basophils Relative: 1.2 % (ref 0.0–3.0)
Eosinophils Absolute: 0.3 10*3/uL (ref 0.0–0.7)
Eosinophils Relative: 4.1 % (ref 0.0–5.0)
HCT: 38 % (ref 36.0–46.0)
Hemoglobin: 12.9 g/dL (ref 12.0–15.0)
Lymphocytes Relative: 17 % (ref 12.0–46.0)
Lymphs Abs: 1.3 10*3/uL (ref 0.7–4.0)
MCHC: 34.1 g/dL (ref 30.0–36.0)
MCV: 91.4 fl (ref 78.0–100.0)
Monocytes Absolute: 0.6 10*3/uL (ref 0.1–1.0)
Monocytes Relative: 7.5 % (ref 3.0–12.0)
Neutro Abs: 5.5 10*3/uL (ref 1.4–7.7)
Neutrophils Relative %: 70.2 % (ref 43.0–77.0)
Platelets: 196 10*3/uL (ref 150.0–400.0)
RBC: 4.15 Mil/uL (ref 3.87–5.11)
RDW: 13 % (ref 11.5–15.5)
WBC: 7.8 10*3/uL (ref 4.0–10.5)

## 2020-12-24 LAB — HEMOGLOBIN A1C: Hgb A1c MFr Bld: 5.8 % (ref 4.6–6.5)

## 2020-12-24 LAB — LIPID PANEL
Cholesterol: 153 mg/dL (ref 0–200)
HDL: 57.1 mg/dL (ref >39.00)
LDL Cholesterol: 73 mg/dL (ref 0–99)
NonHDL: 96.06
Total CHOL/HDL Ratio: 3
Triglycerides: 117 mg/dL (ref 0.0–149.0)
VLDL: 23.4 mg/dL (ref 0.0–40.0)

## 2020-12-26 ENCOUNTER — Ambulatory Visit: Payer: 59 | Admitting: Internal Medicine

## 2021-01-08 ENCOUNTER — Encounter: Payer: Self-pay | Admitting: Internal Medicine

## 2021-01-08 ENCOUNTER — Telehealth: Payer: Self-pay | Admitting: Internal Medicine

## 2021-01-08 ENCOUNTER — Telehealth (INDEPENDENT_AMBULATORY_CARE_PROVIDER_SITE_OTHER): Payer: Medicare Other | Admitting: Internal Medicine

## 2021-01-08 VITALS — Ht 62.0 in | Wt 158.0 lb

## 2021-01-08 DIAGNOSIS — E7800 Pure hypercholesterolemia, unspecified: Secondary | ICD-10-CM | POA: Diagnosis not present

## 2021-01-08 DIAGNOSIS — N183 Chronic kidney disease, stage 3 unspecified: Secondary | ICD-10-CM

## 2021-01-08 DIAGNOSIS — Z1231 Encounter for screening mammogram for malignant neoplasm of breast: Secondary | ICD-10-CM

## 2021-01-08 DIAGNOSIS — K219 Gastro-esophageal reflux disease without esophagitis: Secondary | ICD-10-CM | POA: Diagnosis not present

## 2021-01-08 DIAGNOSIS — I10 Essential (primary) hypertension: Secondary | ICD-10-CM | POA: Diagnosis not present

## 2021-01-08 DIAGNOSIS — E039 Hypothyroidism, unspecified: Secondary | ICD-10-CM

## 2021-01-08 DIAGNOSIS — R739 Hyperglycemia, unspecified: Secondary | ICD-10-CM

## 2021-01-08 MED ORDER — HYDRALAZINE HCL 10 MG PO TABS: 10.0000 mg | ORAL_TABLET | Freq: Three times a day (TID) | ORAL | 1 refills | Status: DC

## 2021-01-08 NOTE — Telephone Encounter (Signed)
PT call back stating she was returning a call from Larena Glassman was unsure if it was about the 12 pm appointment

## 2021-01-08 NOTE — Telephone Encounter (Signed)
Was calling to room for appt.

## 2021-01-08 NOTE — Progress Notes (Signed)
[name], female   DOB: [birth year], 87 y.o.   MRN: [id number]   Virtual Visit via telephone Note  This visit type was conducted due to national recommendations for restrictions regarding the COVID-19 pandemic (e.g. social distancing).  This format is felt to be most appropriate for this patient at this time.  All issues noted in this document were discussed and addressed.  No physical exam was performed (except for noted visual exam findings with Video Visits).   I connected with [name] by telephone and verified that I am speaking with the correct person using two identifiers. Location patient: home Location provider: home office Persons participating in the virtual visit: patient, provider  The limitations, risks, security and privacy concerns of performing an evaluation and management service by telephone and the availability of in person appointments have been discussed.  It has also been discussed with the patient that there may be a patient responsible charge related to this service. The patient expressed understanding and agreed to proceed.   Reason for visit: scheduled follow up.   HPI: Follow up regarding her blood pressure and cholesterol.  Also seeing nephrology for CKD.  Discussed avoiding antiinflammatories.  Tries to stay active.  No chest pain or sob with increased activity or exertion.  No acid reflux.  No abdominal pain.  Bowels moving.  Has been seeing Dr Howell Rucks - gyn.  Wants to get mammograms locally now and wants to start getting her amitriptyline refilled through [place].  Has done well on this medication with no significant vaginal pain. Does report that her blood pressure is remaining elevated.  Most readings 140s/60-70.  No headache.  No dizziness reported.     ROS: See pertinent positives and negatives per HPI.  Past Medical History:  Diagnosis Date  . Arthritis    lower back, hands  . Benign esophageal stricture   . Complication of anesthesia    . Diverticulosis   . Diverticulosis   . Dysphagia   . Family history of adverse reaction to anesthesia    brother had a hard time waking up with hernia surgery as a child.  Marland Kitchen GERD (gastroesophageal reflux disease)   . Headache(784.0)    h/o recurrent vascular headaches. no longer gets these  . Hypercholesterolemia   . Hypertension   . Hypothyroidism   . IBS (irritable bowel syndrome)   . Leg swelling   . Neck fullness 2016   dr. Nicki Reaper reviewed and found it to be nothing.  prinivil seemed to cause face and neck and tongue swelling  . PONV (postoperative nausea and vomiting)   . Transient global amnesia    x1, over 25 yrs ago after migraine  . Urinary tract bacterial infections   . Vertigo    x1 - several yrs ago.  OK after Epley maneuver    Past Surgical History:  Procedure Laterality Date  . ABDOMINAL HYSTERECTOMY    . APPENDECTOMY    . CATARACT EXTRACTION W/PHACO Right 10/12/2017   Procedure: CATARACT EXTRACTION PHACO AND INTRAOCULAR LENS PLACEMENT ([place]) RIGHT;  Surgeon: Leandrew Koyanagi, MD;  Location: [place];  Service: Ophthalmology;  Laterality: Right;  . CATARACT EXTRACTION W/PHACO Left 11/09/2017   Procedure: CATARACT EXTRACTION PHACO AND INTRAOCULAR LENS PLACEMENT ([place]) LEFT toric;  Surgeon: Leandrew Koyanagi, MD;  Location: [place];  Service: Ophthalmology;  Laterality: Left;  . CHOLECYSTECTOMY  1990  . CYSTOSCOPY     x2  . ESOPHAGOGASTRODUODENOSCOPY (EGD) WITH PROPOFOL N/A 08/28/2015  Procedure: ESOPHAGOGASTRODUODENOSCOPY (EGD) WITH PROPOFOL;  Surgeon: Hulen Luster, MD;  Location: [facility] ENDOSCOPY;  Service: Gastroenterology;  Laterality: N/A;  . ESOPHAGOGASTRODUODENOSCOPY (EGD) WITH PROPOFOL N/A 04/04/2017   Procedure: ESOPHAGOGASTRODUODENOSCOPY (EGD) WITH PROPOFOL;  Surgeon: Lollie Sails, MD;  Location: [facility] ENDOSCOPY;  Service: Endoscopy;  Laterality: N/A;  . PARTIAL HYSTERECTOMY    . PARTIAL KNEE ARTHROPLASTY Left 04/15/2016    Procedure: UNICOMPARTMENTAL KNEE;  Surgeon: Corky Mull, MD;  Location: [facility]S;  Service: Orthopedics;  Laterality: Left;  . TONSILLECTOMY      Family History  Problem Relation Age of Onset  . Heart disease Father   . Heart disease Mother   . Heart disease Brother   . Heart disease Brother   . Breast cancer Neg Hx   . Colon cancer Neg Hx     SOCIAL HX: reviewed.    Current Outpatient Medications:  .  amitriptyline (ELAVIL) 25 MG tablet, Take 1 tablet (25 mg total) by mouth at bedtime., Disp: 90 tablet, Rfl: 1 .  amLODipine (NORVASC) 10 MG tablet, TAKE 1 TABLET DAILY, Disp: 90 tablet, Rfl: 1 .  aspirin 81 MG tablet, Take 81 mg by mouth daily., Disp: , Rfl:  .  atorvastatin (LIPITOR) 40 MG tablet, TAKE 1 TABLET DAILY, Disp: 90 tablet, Rfl: 3 .  Calcium Carbonate-Vitamin D 600-400 MG-UNIT tablet, Take 1 tablet by mouth daily., Disp: , Rfl:  .  Cholecalciferol (VITAMIN D3) 1000 units CAPS, Take by mouth., Disp: , Rfl:  .  estrogens, conjugated, (PREMARIN) 0.9 MG tablet, Take 0.9 mg by mouth daily., Disp: , Rfl:  .  hydrALAZINE (APRESOLINE) 10 MG tablet, Take 1 tablet (10 mg total) by mouth 3 (three) times daily., Disp: 90 tablet, Rfl: 1 .  metoprolol succinate (TOPROL-XL) 50 MG 24 hr tablet, TAKE 1 TABLET TWICE A DAY  WITH OR IMMEDIATELY        FOLLOWING MEALS, Disp: 180 tablet, Rfl: 1 .  pantoprazole (PROTONIX) 40 MG tablet, Take 1 tablet (40 mg total) by mouth daily., Disp: 90 tablet, Rfl: 3 .  SYNTHROID 75 MCG tablet, TAKE 1 TABLET DAILY BEFORE BREAKFAST, Disp: 90 tablet, Rfl: 1  EXAM:  VITALS per patient if applicable: 559/74-16L.   GENERAL: alert. Sounds to be in no acute distress.  Answering questions appropriately.   PSYCH/NEURO: pleasant and cooperative, no obvious depression or anxiety, speech and thought processing grossly intact  ASSESSMENT AND PLAN:  Discussed the following assessment and plan:  Problem List Items Addressed This Visit    CKD (chronic kidney  disease), stage III ([place])    Continue to avoid antiinflammatories.  Swelling with ace inhibitor.  Get blood pressure under better control.  Continue f/u with nephrology.       GERD (gastroesophageal reflux disease)    No upper symptoms reported.  On protonix.       Hypercholesterolemia    Continue lipitor.  Low cholesterol diet and exercise.  Follow lipid panel and liver function tests.        Relevant Medications   hydrALAZINE (APRESOLINE) 10 MG tablet   Hyperglycemia    Low carb diet and exercise.  Discussed with her today.  Follow met b and a1c.   Lab Results  Component Value Date   HGBA1C 5.8 12/24/2020        Hypertension    Blood pressure remaining elelvated.  Continue amlodipine and metoprolol.  Had swelling with ace inhibitor.  Will add hydralazine $RemoveBeforeD'10mg'[id number]$  tid.  Follow pressures.  Follow metabolic  panel.       Relevant Medications   hydrALAZINE (APRESOLINE) 10 MG tablet   Hypothyroidism    Recent thyroid ultrasound - no significant thyroid nodules noted.  On thyroid replacement.  Follow tsh.        Other Visit Diagnoses    Visit for screening mammogram    -  Primary   Relevant Orders   MM 3D SCREEN BREAST BILATERAL       I discussed the assessment and treatment plan with the patient. The patient was provided an opportunity to ask questions and all were answered. The patient agreed with the plan and demonstrated an understanding of the instructions.   The patient was advised to call back or seek an in-person evaluation if the symptoms worsen or if the condition fails to improve as anticipated.  I provided 22 minutes of non-face-to-face time during this encounter.   Einar Pheasant, MD

## 2021-01-11 ENCOUNTER — Encounter: Payer: Self-pay | Admitting: Internal Medicine

## 2021-01-11 NOTE — Assessment & Plan Note (Signed)
Continue lipitor.  Low cholesterol diet and exercise.  Follow lipid panel and liver function tests.   

## 2021-01-11 NOTE — Assessment & Plan Note (Signed)
No upper symptoms reported.  On protonix.   

## 2021-01-11 NOTE — Assessment & Plan Note (Signed)
Low carb diet and exercise.  Discussed with her today.  Follow met b and a1c.   Lab Results  Component Value Date   HGBA1C 5.8 12/24/2020

## 2021-01-11 NOTE — Assessment & Plan Note (Signed)
Blood pressure remaining elelvated.  Continue amlodipine and metoprolol.  Had swelling with ace inhibitor.  Will add hydralazine 10mg  tid.  Follow pressures.  Follow metabolic panel.

## 2021-01-11 NOTE — Assessment & Plan Note (Signed)
Continue to avoid antiinflammatories.  Swelling with ace inhibitor.  Get blood pressure under better control.  Continue f/u with nephrology.

## 2021-01-11 NOTE — Assessment & Plan Note (Signed)
Recent thyroid ultrasound - no significant thyroid nodules noted.  On thyroid replacement.  Follow tsh.  

## 2021-01-30 ENCOUNTER — Telehealth: Payer: Self-pay | Admitting: Internal Medicine

## 2021-01-30 ENCOUNTER — Other Ambulatory Visit: Payer: Self-pay | Admitting: Internal Medicine

## 2021-01-30 NOTE — Telephone Encounter (Signed)
Patient called about her BP. She has not seen much of a change, average reading between 123 to 147,mostly top number in the 130's,  bottom number 65-67.

## 2021-01-30 NOTE — Telephone Encounter (Signed)
Hydralazine changed to TID at last visit . Pt has not seen much change in her BP readings. Confirmed she is doing ok.

## 2021-02-01 NOTE — Telephone Encounter (Signed)
Confirm she is currently on hydralazine 10mg  tid.  If so, can increase hydralazine to 25mg  bid (to begin).  Will follow.  May increase to tid pending blood pressure response.

## 2021-02-02 ENCOUNTER — Other Ambulatory Visit: Payer: Self-pay

## 2021-02-02 MED ORDER — HYDRALAZINE HCL 25 MG PO TABS
25.0000 mg | ORAL_TABLET | Freq: Two times a day (BID) | ORAL | 0 refills | Status: DC
Start: 2021-02-02 — End: 2021-02-26

## 2021-02-02 NOTE — Telephone Encounter (Signed)
Pt confirmed she is taking hydralazine 10mg  tid. Changed rx to hydralazine 25 mg bid. Sent in new rx. She will monitor pressures

## 2021-02-17 ENCOUNTER — Other Ambulatory Visit: Payer: Self-pay

## 2021-02-17 ENCOUNTER — Ambulatory Visit
Admission: RE | Admit: 2021-02-17 | Discharge: 2021-02-17 | Disposition: A | Discharge status: Home / Self Care | Payer: Medicare Other | Source: Ambulatory Visit | Attending: Internal Medicine | Admitting: Internal Medicine

## 2021-02-17 DIAGNOSIS — Z1231 Encounter for screening mammogram for malignant neoplasm of breast: Secondary | ICD-10-CM | POA: Insufficient documentation

## 2021-02-18 ENCOUNTER — Other Ambulatory Visit: Payer: Self-pay | Admitting: *Deleted

## 2021-02-18 ENCOUNTER — Inpatient Hospital Stay
Admission: RE | Admit: 2021-02-18 | Discharge: 2021-02-18 | Disposition: A | Discharge status: Home / Self Care | Payer: Self-pay | Source: Ambulatory Visit | Attending: *Deleted | Admitting: *Deleted

## 2021-02-18 DIAGNOSIS — Z1231 Encounter for screening mammogram for malignant neoplasm of breast: Secondary | ICD-10-CM

## 2021-02-19 ENCOUNTER — Other Ambulatory Visit: Payer: Self-pay | Admitting: Internal Medicine

## 2021-02-23 ENCOUNTER — Ambulatory Visit: Payer: 59 | Admitting: Internal Medicine

## 2021-02-25 ENCOUNTER — Other Ambulatory Visit: Payer: Self-pay | Admitting: Internal Medicine

## 2021-03-05 ENCOUNTER — Other Ambulatory Visit: Payer: Self-pay

## 2021-03-05 ENCOUNTER — Encounter: Payer: Self-pay | Admitting: Internal Medicine

## 2021-03-05 ENCOUNTER — Ambulatory Visit (INDEPENDENT_AMBULATORY_CARE_PROVIDER_SITE_OTHER): Payer: Medicare Other | Admitting: Internal Medicine

## 2021-03-05 DIAGNOSIS — K219 Gastro-esophageal reflux disease without esophagitis: Secondary | ICD-10-CM

## 2021-03-05 DIAGNOSIS — E041 Nontoxic single thyroid nodule: Secondary | ICD-10-CM | POA: Diagnosis not present

## 2021-03-05 DIAGNOSIS — E7800 Pure hypercholesterolemia, unspecified: Secondary | ICD-10-CM

## 2021-03-05 DIAGNOSIS — K589 Irritable bowel syndrome without diarrhea: Secondary | ICD-10-CM

## 2021-03-05 DIAGNOSIS — N183 Chronic kidney disease, stage 3 unspecified: Secondary | ICD-10-CM

## 2021-03-05 DIAGNOSIS — R739 Hyperglycemia, unspecified: Secondary | ICD-10-CM | POA: Diagnosis not present

## 2021-03-05 DIAGNOSIS — E039 Hypothyroidism, unspecified: Secondary | ICD-10-CM

## 2021-03-05 DIAGNOSIS — M7989 Other specified soft tissue disorders: Secondary | ICD-10-CM | POA: Diagnosis not present

## 2021-03-05 DIAGNOSIS — I10 Essential (primary) hypertension: Secondary | ICD-10-CM

## 2021-03-05 MED ORDER — HYDRALAZINE HCL 25 MG PO TABS: 25.0000 mg | ORAL_TABLET | Freq: Three times a day (TID) | ORAL | 2 refills | Status: DC

## 2021-03-05 NOTE — Progress Notes (Signed)
[name], female   DOB: [birth year], 87 y.o.   MRN: [id number]   Subjective:    [name], female    DOB: Jun 20, 1932, 87 y.o.   MRN: [id number]  HPI This visit occurred during the SARS-CoV-2 public health emergency.  Safety protocols were in place, including screening questions prior to the visit, additional usage of staff PPE, and extensive cleaning of exam room while observing appropriate contact time as indicated for disinfecting solutions.   Patient here for a scheduled follow up.  Here to follow up regarding her blood pressure.  Was recently started on hydralazine.  Dose adjusted to $RemoveBef'25mg'nwhXWGLezK$  bid.  Blood pressure remains elevated.  Discussed increasing to tid.  Tries to stay active.  No chest pain.  Breathing stable.  No acid reflux reported.  No abdominal pain.  Bowels moving.    Past Medical History:  Diagnosis Date   Arthritis    lower back, hands   Benign esophageal stricture    Complication of anesthesia    Diverticulosis    Diverticulosis    Dysphagia    Family history of adverse reaction to anesthesia    brother had a hard time waking up with hernia surgery as a child.   GERD (gastroesophageal reflux disease)    Headache(784.0)    h/o recurrent vascular headaches. no longer gets these   Hypercholesterolemia    Hypertension    Hypothyroidism    IBS (irritable bowel syndrome)    Leg swelling    Neck fullness 2016   dr. Nicki Reaper reviewed and found it to be nothing.  prinivil seemed to cause face and neck and tongue swelling   PONV (postoperative nausea and vomiting)    Transient global amnesia    x1, over 25 yrs ago after migraine   Urinary tract bacterial infections    Vertigo    x1 - several yrs ago.  OK after Epley maneuver   Past Surgical History:  Procedure Laterality Date   ABDOMINAL HYSTERECTOMY     APPENDECTOMY     CATARACT EXTRACTION W/PHACO Right 10/12/2017   Procedure: CATARACT EXTRACTION PHACO AND INTRAOCULAR LENS PLACEMENT  ([place]) RIGHT;  Surgeon: Leandrew Koyanagi, MD;  Location: [facility];  Service: Ophthalmology;  Laterality: Right;   CATARACT EXTRACTION W/PHACO Left 11/09/2017   Procedure: CATARACT EXTRACTION PHACO AND INTRAOCULAR LENS PLACEMENT ([place]) LEFT toric;  Surgeon: Leandrew Koyanagi, MD;  Location: [facility];  Service: Ophthalmology;  Laterality: Left;   CHOLECYSTECTOMY  1990   CYSTOSCOPY     x2   ESOPHAGOGASTRODUODENOSCOPY (EGD) WITH PROPOFOL N/A 08/28/2015   Procedure: ESOPHAGOGASTRODUODENOSCOPY (EGD) WITH PROPOFOL;  Surgeon: Hulen Luster, MD;  Location: [facility] ENDOSCOPY;  Service: Gastroenterology;  Laterality: N/A;   ESOPHAGOGASTRODUODENOSCOPY (EGD) WITH PROPOFOL N/A 04/04/2017   Procedure: ESOPHAGOGASTRODUODENOSCOPY (EGD) WITH PROPOFOL;  Surgeon: Lollie Sails, MD;  Location: [facility] ENDOSCOPY;  Service: Endoscopy;  Laterality: N/A;   PARTIAL HYSTERECTOMY     PARTIAL KNEE ARTHROPLASTY Left 04/15/2016   Procedure: UNICOMPARTMENTAL KNEE;  Surgeon: Corky Mull, MD;  Location: [facility];  Service: Orthopedics;  Laterality: Left;   TONSILLECTOMY     Family History  Problem Relation Age of Onset   Heart disease Father    Heart disease Mother    Heart disease Brother    Heart disease Brother    Breast cancer Neg Hx    Colon cancer Neg Hx    Social History   Socioeconomic History   Marital status: Married  Spouse name: Not on file   Number of children: 2   Years of education: Not on file   Highest education level: Not on file  Occupational History   Occupation: retired  Tobacco Use   Smoking status: Never   Smokeless tobacco: Never  Vaping Use   Vaping Use: Never used  Substance and Sexual Activity   Alcohol use: No    Alcohol/week: 0.0 standard drinks    Comment: seldom, a glass of wine 1-3 per year.   Drug use: No   Sexual activity: Not Currently  Other Topics Concern   Not on file  Social History Narrative   Not on file   Social Determinants of Health    Financial Resource Strain: Low Risk    Difficulty of Paying Living Expenses: Not hard at all  Food Insecurity: No Food Insecurity   Worried About Running Out of Food in the Last Year: Never true   [place] in the Last Year: Never true  Transportation Needs: No Transportation Needs   Lack of Transportation (Medical): No   Lack of Transportation (Non-Medical): No  Physical Activity: Not on file  Stress: No Stress Concern Present   Feeling of Stress : Not at all  Social Connections: Unknown   Frequency of Communication with Friends and Family: Not on file   Frequency of Social Gatherings with Friends and Family: Not on file   Attends Religious Services: Not on file   Active Member of Clubs or Organizations: Not on file   Attends Archivist Meetings: Not on file   Marital Status: Married    Outpatient Encounter Medications as of 03/05/2021  Medication Sig   amitriptyline (ELAVIL) 25 MG tablet Take 1 tablet (25 mg total) by mouth at bedtime.   amLODipine (NORVASC) 10 MG tablet TAKE 1 TABLET DAILY   aspirin 81 MG tablet Take 81 mg by mouth daily.   atorvastatin (LIPITOR) 40 MG tablet TAKE 1 TABLET DAILY   Calcium Carbonate-Vitamin D 600-400 MG-UNIT tablet Take 1 tablet by mouth daily.   Cholecalciferol (VITAMIN D3) 1000 units CAPS Take by mouth.   hydrALAZINE (APRESOLINE) 25 MG tablet Take 1 tablet (25 mg total) by mouth 3 (three) times daily.   metoprolol succinate (TOPROL-XL) 50 MG 24 hr tablet TAKE 1 TABLET TWICE A DAY  WITH OR IMMEDIATELY        FOLLOWING MEALS   pantoprazole (PROTONIX) 40 MG tablet Take 1 tablet (40 mg total) by mouth daily.   SYNTHROID 75 MCG tablet TAKE 1 TABLET DAILY BEFORE BREAKFAST   [DISCONTINUED] estrogens, conjugated, (PREMARIN) 0.9 MG tablet Take 0.9 mg by mouth daily. (Patient not taking: Reported on 03/05/2021)   [DISCONTINUED] hydrALAZINE (APRESOLINE) 25 MG tablet TAKE 1 TABLET BY MOUTH TWICE A DAY   No facility-administered encounter  medications on file as of 03/05/2021.     Review of Systems  Constitutional:  Negative for appetite change and unexpected weight change.  HENT:  Negative for congestion and sinus pressure.   Respiratory:  Negative for cough, chest tightness and shortness of breath.   Cardiovascular:  Negative for chest pain, palpitations and leg swelling.  Gastrointestinal:  Negative for abdominal pain, diarrhea, nausea and vomiting.  Genitourinary:  Negative for difficulty urinating and dysuria.  Musculoskeletal:  Negative for joint swelling and myalgias.  Skin:  Negative for color change and rash.  Neurological:  Negative for dizziness, light-headedness and headaches.  Psychiatric/Behavioral:  Negative for agitation and dysphoric mood.  Objective:    Physical Exam Vitals reviewed.  Constitutional:      General: She is not in acute distress.    Appearance: Normal appearance.  HENT:     Head: Normocephalic and atraumatic.     Right Ear: External ear normal.     Left Ear: External ear normal.  Eyes:     General: No scleral icterus.       Right eye: No discharge.        Left eye: No discharge.     Conjunctiva/sclera: Conjunctivae normal.  Neck:     Thyroid: No thyromegaly.  Cardiovascular:     Rate and Rhythm: Normal rate and regular rhythm.  Pulmonary:     Effort: No respiratory distress.     Breath sounds: Normal breath sounds. No wheezing.  Abdominal:     General: Bowel sounds are normal.     Palpations: Abdomen is soft.     Tenderness: There is no abdominal tenderness.  Musculoskeletal:        General: No swelling or tenderness.     Cervical back: Neck supple. No tenderness.  Lymphadenopathy:     Cervical: No cervical adenopathy.  Skin:    Findings: No erythema or rash.  Neurological:     Mental Status: She is alert.  Psychiatric:        Mood and Affect: Mood normal.        Behavior: Behavior normal.    BP 126/74   Pulse (!) 57   Temp 97.8 F (36.6 C)   Resp 16   Ht  $R'5\' 2"'Iw$  (1.575 m)   Wt 157 lb (71.2 kg)   SpO2 99%   BMI 28.72 kg/m  Wt Readings from Last 3 Encounters:  03/05/21 157 lb (71.2 kg)  01/08/21 158 lb (71.7 kg)  08/27/20 158 lb 9.6 oz (71.9 kg)     Lab Results  Component Value Date   WBC 7.8 12/24/2020   HGB 12.9 12/24/2020   HCT 38.0 12/24/2020   PLT 196.0 12/24/2020   GLUCOSE 105 (H) 12/24/2020   CHOL 153 12/24/2020   TRIG 117.0 12/24/2020   HDL 57.10 12/24/2020   LDLDIRECT 64.9 12/08/2012   LDLCALC 73 12/24/2020   ALT 12 12/24/2020   AST 16 12/24/2020   NA 139 12/24/2020   K 4.6 12/24/2020   CL 102 12/24/2020   CREATININE 1.36 (H) 12/24/2020   BUN 23 12/24/2020   CO2 27 12/24/2020   TSH 2.52 12/24/2020   INR 1.03 04/07/2016   HGBA1C 5.8 12/24/2020    MM Outside [facility] Mammo  Result Date: 02/18/2021 This examination belongs to an outside facility and is stored here for comparison purposes only.  Contact the originating outside institution for any associated report or interpretation.  MM Outside Films Mammo  Result Date: 02/18/2021 This examination belongs to an outside facility and is stored here for comparison purposes only.  Contact the originating outside institution for any associated report or interpretation.  MM Outside Films Mammo  Result Date: 02/18/2021 This examination belongs to an outside facility and is stored here for comparison purposes only.  Contact the originating outside institution for any associated report or interpretation.  MM Outside Films Mammo  Result Date: 02/18/2021 This examination belongs to an outside facility and is stored here for comparison purposes only.  Contact the originating outside institution for any associated report or interpretation.  MM Outside Films Mammo  Result Date: 02/18/2021 This examination belongs to an outside facility and is stored  here for comparison purposes only.  Contact the originating outside institution for any associated report or interpretation.  MM  Outside Films Mammo  Result Date: 02/18/2021 This examination belongs to an outside facility and is stored here for comparison purposes only.  Contact the originating outside institution for any associated report or interpretation.  MM Outside Films Mammo  Result Date: 02/18/2021 This examination belongs to an outside facility and is stored here for comparison purposes only.  Contact the originating outside institution for any associated report or interpretation.      Assessment & Plan:   Problem List Items Addressed This Visit     CKD (chronic kidney disease), stage III ([place])    Continue to avoid anti-inflammatories.  Had swelling with ACE inhibitor.  Avoid ACE inhibitor and ARB's.  Follow blood pressure.  Continue follow-up with nephrology.       Relevant Orders   Basic metabolic panel   GERD (gastroesophageal reflux disease)    No upper symptoms reported on Protonix.       Hypercholesterolemia    On lipitor.  Low cholesterol diet and exercise.  Follow lipid panel and liver function tests.         Relevant Medications   hydrALAZINE (APRESOLINE) 25 MG tablet   Other Relevant Orders   Hepatic function panel   Lipid panel   Hyperglycemia    Low carb diet and exercise.  Follow met b and a1c.        Relevant Orders   Hemoglobin A1c   Hypertension    Blood pressure remains elevated.  Continues on amlodipine and metoprolol.  Had swelling with ACE inhibitor.  Was started on hydralazine previously.  Will increase hydralazine to 25 mg 3 times daily.  Follow pressures.  Follow metabolic panel.       Relevant Medications   hydrALAZINE (APRESOLINE) 25 MG tablet   Hypothyroidism    On thyroid replacement.  Follow TSH.       IBS (irritable bowel syndrome)    Bowel stable.       Leg swelling    Continue.  Compression hose.  Continue leg elevation.  Swelling appears to be improved.        Thyroid nodule    Ultrasound 05/2020 revealed no nodules of consequence.          Einar Pheasant, MD

## 2021-03-15 ENCOUNTER — Encounter: Payer: Self-pay | Admitting: Internal Medicine

## 2021-03-15 NOTE — Assessment & Plan Note (Signed)
Bowel stable.

## 2021-03-15 NOTE — Assessment & Plan Note (Signed)
Low carb diet and exercise.  Follow met b and a1c.  

## 2021-03-15 NOTE — Assessment & Plan Note (Signed)
Ultrasound 05/2020 revealed no nodules of consequence.

## 2021-03-15 NOTE — Assessment & Plan Note (Signed)
On thyroid replacement.  Follow TSH 

## 2021-03-15 NOTE — Assessment & Plan Note (Signed)
Blood pressure remains elevated.  Continues on amlodipine and metoprolol.  Had swelling with ACE inhibitor.  Was started on hydralazine previously.  Will increase hydralazine to 25 mg 3 times daily.  Follow pressures.  Follow metabolic panel.

## 2021-03-15 NOTE — Assessment & Plan Note (Signed)
Continue to avoid anti-inflammatories.  Had swelling with ACE inhibitor.  Avoid ACE inhibitor and ARB's.  Follow blood pressure.  Continue follow-up with nephrology.

## 2021-03-15 NOTE — Assessment & Plan Note (Signed)
No upper symptoms reported on Protonix.

## 2021-03-15 NOTE — Assessment & Plan Note (Addendum)
Continue.  Compression hose.  Continue leg elevation.  Swelling appears to be improved.

## 2021-03-15 NOTE — Assessment & Plan Note (Signed)
On lipitor.  Low cholesterol diet and exercise.  Follow lipid panel and liver function tests.   

## 2021-05-04 ENCOUNTER — Other Ambulatory Visit: Payer: Self-pay

## 2021-05-04 ENCOUNTER — Other Ambulatory Visit (INDEPENDENT_AMBULATORY_CARE_PROVIDER_SITE_OTHER): Payer: Medicare Other

## 2021-05-04 DIAGNOSIS — E7800 Pure hypercholesterolemia, unspecified: Secondary | ICD-10-CM | POA: Diagnosis not present

## 2021-05-04 DIAGNOSIS — R739 Hyperglycemia, unspecified: Secondary | ICD-10-CM | POA: Diagnosis not present

## 2021-05-04 DIAGNOSIS — N183 Chronic kidney disease, stage 3 unspecified: Secondary | ICD-10-CM | POA: Diagnosis not present

## 2021-05-04 LAB — BASIC METABOLIC PANEL
BUN: 19 mg/dL (ref 6–23)
CO2: 25 mEq/L (ref 19–32)
Calcium: 9.7 mg/dL (ref 8.4–10.5)
Chloride: 104 mEq/L (ref 96–112)
Creatinine, Ser: 1.35 mg/dL — ABNORMAL HIGH (ref 0.40–1.20)
GFR: 35.52 mL/min — ABNORMAL LOW (ref >60.00)
Glucose, Bld: 102 mg/dL — ABNORMAL HIGH (ref 70–99)
Potassium: 4.2 mEq/L (ref 3.5–5.1)
Sodium: 139 mEq/L (ref 135–145)

## 2021-05-04 LAB — LIPID PANEL
Cholesterol: 151 mg/dL (ref 0–200)
HDL: 56.5 mg/dL (ref >39.00)
LDL Cholesterol: 70 mg/dL (ref 0–99)
NonHDL: 94.46
Total CHOL/HDL Ratio: 3
Triglycerides: 122 mg/dL (ref 0.0–149.0)
VLDL: 24.4 mg/dL (ref 0.0–40.0)

## 2021-05-04 LAB — HEPATIC FUNCTION PANEL
ALT: 17 U/L (ref 0–35)
AST: 22 U/L (ref 0–37)
Albumin: 4.2 g/dL (ref 3.5–5.2)
Alkaline Phosphatase: 107 U/L (ref 39–117)
Bilirubin, Direct: 0.1 mg/dL (ref 0.0–0.3)
Total Bilirubin: 0.7 mg/dL (ref 0.2–1.2)
Total Protein: 6.9 g/dL (ref 6.0–8.3)

## 2021-05-04 LAB — HEMOGLOBIN A1C: Hgb A1c MFr Bld: 5.6 % (ref 4.6–6.5)

## 2021-05-06 ENCOUNTER — Other Ambulatory Visit: Payer: Self-pay

## 2021-05-06 ENCOUNTER — Encounter: Payer: Self-pay | Admitting: Internal Medicine

## 2021-05-06 ENCOUNTER — Ambulatory Visit (INDEPENDENT_AMBULATORY_CARE_PROVIDER_SITE_OTHER): Payer: Medicare Other | Admitting: Internal Medicine

## 2021-05-06 VITALS — BP 112/68 | HR 76 | Temp 97.6°F | Resp 16 | Ht 62.0 in | Wt 157.0 lb

## 2021-05-06 DIAGNOSIS — R739 Hyperglycemia, unspecified: Secondary | ICD-10-CM | POA: Diagnosis not present

## 2021-05-06 DIAGNOSIS — I10 Essential (primary) hypertension: Secondary | ICD-10-CM

## 2021-05-06 DIAGNOSIS — R0989 Other specified symptoms and signs involving the circulatory and respiratory systems: Secondary | ICD-10-CM

## 2021-05-06 DIAGNOSIS — N183 Chronic kidney disease, stage 3 unspecified: Secondary | ICD-10-CM | POA: Diagnosis not present

## 2021-05-06 DIAGNOSIS — I700 Atherosclerosis of aorta: Secondary | ICD-10-CM

## 2021-05-06 DIAGNOSIS — M7989 Other specified soft tissue disorders: Secondary | ICD-10-CM | POA: Diagnosis not present

## 2021-05-06 DIAGNOSIS — K219 Gastro-esophageal reflux disease without esophagitis: Secondary | ICD-10-CM

## 2021-05-06 DIAGNOSIS — E7800 Pure hypercholesterolemia, unspecified: Secondary | ICD-10-CM

## 2021-05-06 DIAGNOSIS — E039 Hypothyroidism, unspecified: Secondary | ICD-10-CM

## 2021-05-06 DIAGNOSIS — K589 Irritable bowel syndrome without diarrhea: Secondary | ICD-10-CM | POA: Diagnosis not present

## 2021-05-06 MED ORDER — HYDRALAZINE HCL 25 MG PO TABS: 25.0000 mg | ORAL_TABLET | Freq: Three times a day (TID) | ORAL | 1 refills | Status: DC

## 2021-05-06 NOTE — Progress Notes (Addendum)
[name], female   DOB: 10/31/1931, 87 y.o.   MRN: [id number]   Subjective:    [name], female    DOB: 1932/06/22, 87 y.o.   MRN: [id number]  HPI This visit occurred during the SARS-CoV-2 public health emergency.  Safety protocols were in place, including screening questions prior to the visit, additional usage of staff PPE, and extensive cleaning of exam room while observing appropriate contact time as indicated for disinfecting solutions.   Patient here for a scheduled follow up.  Here to follow up regarding her blood pressure.  Reviewed outside blood pressure checks.  Most readings ranging between 120s to 130s over 60s.  She tries to stay active around the house.  No chest pain reported.  Breathing stable.  No increased cough or congestion.  No increased abdominal pain reported.  No sick contacts.  No fever.  No nausea or vomiting.  Probiotics helped bowels some.  Discussed using Benefiber.  Overall she feels she is doing relatively well.  Past Medical History:  Diagnosis Date   Arthritis    lower back, hands   Benign esophageal stricture    Complication of anesthesia    Diverticulosis    Diverticulosis    Dysphagia    Family history of adverse reaction to anesthesia    brother had a hard time waking up with hernia surgery as a child.   GERD (gastroesophageal reflux disease)    Headache(784.0)    h/o recurrent vascular headaches. no longer gets these   Hypercholesterolemia    Hypertension    Hypothyroidism    IBS (irritable bowel syndrome)    Leg swelling    Neck fullness 2016   dr. Nicki Reaper reviewed and found it to be nothing.  prinivil seemed to cause face and neck and tongue swelling   PONV (postoperative nausea and vomiting)    Transient global amnesia    x1, over 25 yrs ago after migraine   Urinary tract bacterial infections    Vertigo    x1 - several yrs ago.  OK after Epley maneuver   Past Surgical History:  Procedure Laterality Date    ABDOMINAL HYSTERECTOMY     APPENDECTOMY     CATARACT EXTRACTION W/PHACO Right 10/12/2017   Procedure: CATARACT EXTRACTION PHACO AND INTRAOCULAR LENS PLACEMENT ([place]) RIGHT;  Surgeon: Leandrew Koyanagi, MD;  Location: [facility];  Service: Ophthalmology;  Laterality: Right;   CATARACT EXTRACTION W/PHACO Left 11/09/2017   Procedure: CATARACT EXTRACTION PHACO AND INTRAOCULAR LENS PLACEMENT ([place]) LEFT toric;  Surgeon: Leandrew Koyanagi, MD;  Location: [facility];  Service: Ophthalmology;  Laterality: Left;   CHOLECYSTECTOMY  1990   CYSTOSCOPY     x2   ESOPHAGOGASTRODUODENOSCOPY (EGD) WITH PROPOFOL N/A 08/28/2015   Procedure: ESOPHAGOGASTRODUODENOSCOPY (EGD) WITH PROPOFOL;  Surgeon: Hulen Luster, MD;  Location[facility] END[facility]COPY;  Service: Gastroenterology;  Laterality: N/A;   ESOPHAGOGASTRODUODENOSCOPY (EGD) WITH PROPOFOL N/A 04/04/2017   Procedure: ESOPHAGOGASTRODUODENOSCOPY (EGD) WITH PROPOFOL;  Surgeon: Lollie Sails, MD;  Location: [facility]COPY;  Service: Endoscopy;  Laterality: N/A;   PARTIAL HYSTERECTOMY     PARTIAL KNEE ARTHROPLASTY Left 04/15/2016   Procedure: UNICOMPARTMENTAL KNEE;  Surgeon: Corky Mull, MD;  Location: [facility];  Service: Orthopedics;  Laterality: Left;   TONSILLECTOMY     Family History  Problem Relation Age of Onset   Heart disease Father    Heart disease Mother    Heart disease Brother    Heart disease Brother  Breast cancer Neg Hx    Colon cancer Neg Hx    Social History   Socioeconomic History   Marital status: Married    Spouse name: Not on file   Number of children: 2   Years of education: Not on file   Highest education level: Not on file  Occupational History   Occupation: retired  Tobacco Use   Smoking status: Never   Smokeless tobacco: Never  Vaping Use   Vaping Use: Never used  Substance and Sexual Activity   Alcohol use: No    Alcohol/week: 0.0 standard drinks    Comment: seldom, a glass of wine 1-3 per year.    Drug use: No   Sexual activity: Not Currently  Other Topics Concern   Not on file  Social History Narrative   Not on file   Social Determinants of Health   Financial Resource Strain: Low Risk    Difficulty of Paying Living Expenses: Not hard at all  Food Insecurity: No Food Insecurity   Worried About Running Out of Food in the Last Year: Never true   [place] in the Last Year: Never true  Transportation Needs: No Transportation Needs   Lack of Transportation (Medical): No   Lack of Transportation (Non-Medical): No  Physical Activity: Not on file  Stress: No Stress Concern Present   Feeling of Stress : Not at all  Social Connections: Unknown   Frequency of Communication with Friends and Family: Not on file   Frequency of Social Gatherings with Friends and Family: Not on file   Attends Religious Services: Not on file   Active Member of Clubs or Organizations: Not on file   Attends Archivist Meetings: Not on file   Marital Status: Married    Review of Systems  Constitutional:  Negative for appetite change and unexpected weight change.  HENT:  Negative for congestion and sinus pressure.   Respiratory:  Negative for cough, chest tightness and shortness of breath.   Cardiovascular:  Negative for chest pain, palpitations and leg swelling.  Gastrointestinal:  Negative for abdominal pain, diarrhea, nausea and vomiting.  Genitourinary:  Negative for difficulty urinating and dysuria.  Musculoskeletal:  Negative for joint swelling and myalgias.  Skin:  Negative for color change and rash.  Neurological:  Negative for dizziness, light-headedness and headaches.  Psychiatric/Behavioral:  Negative for agitation and dysphoric mood.       Objective:    Physical Exam Vitals reviewed.  Constitutional:      General: She is not in acute distress.    Appearance: Normal appearance.  HENT:     Head: Normocephalic and atraumatic.     Right Ear: External ear normal.     Left  Ear: External ear normal.  Eyes:     General: No scleral icterus.       Right eye: No discharge.        Left eye: No discharge.     Conjunctiva/sclera: Conjunctivae normal.  Neck:     Thyroid: No thyromegaly.  Cardiovascular:     Rate and Rhythm: Normal rate and regular rhythm.  Pulmonary:     Effort: No respiratory distress.     Breath sounds: Normal breath sounds. No wheezing.  Abdominal:     General: Bowel sounds are normal.     Palpations: Abdomen is soft.     Tenderness: There is no abdominal tenderness.  Musculoskeletal:        General: No tenderness.  Cervical back: Neck supple. No tenderness.     Comments: No increased swelling.    Lymphadenopathy:     Cervical: No cervical adenopathy.  Skin:    Findings: No erythema or rash.  Neurological:     Mental Status: She is alert.  Psychiatric:        Mood and Affect: Mood normal.        Behavior: Behavior normal.    BP 112/68   Pulse 76   Temp 97.6 F (36.4 C)   Resp 16   Ht _0  (1.575 m)   Wt 157 lb (71.2 kg)   SpO2 97%   BMI 28.72 kg/m  Wt Readings from Last 3 Encounters:  05/06/21 157 lb (71.2 kg)  03/05/21 157 lb (71.2 kg)  01/08/21 158 lb (71.7 kg)    Outpatient Encounter Medications as of 05/06/2021  Medication Sig   amitriptyline (ELAVIL) 25 MG tablet Take 1 tablet (25 mg total) by mouth at bedtime.   amLODipine (NORVASC) 10 MG tablet TAKE 1 TABLET DAILY   aspirin 81 MG tablet Take 81 mg by mouth daily.   atorvastatin (LIPITOR) 40 MG tablet TAKE 1 TABLET DAILY   Calcium Carbonate-Vitamin D 600-400 MG-UNIT tablet Take 1 tablet by mouth daily.   Cholecalciferol (VITAMIN D3) 1000 units CAPS Take by mouth.   hydrALAZINE (APRESOLINE) 25 MG tablet Take 1 tablet (25 mg total) by mouth 3 (three) times daily.   metoprolol succinate (TOPROL-XL) 50 MG 24 hr tablet TAKE 1 TABLET TWICE A DAY  WITH OR IMMEDIATELY        FOLLOWING MEALS   pantoprazole (PROTONIX) 40 MG tablet Take 1 tablet (40 mg total) by mouth  daily.   SYNTHROID 75 MCG tablet TAKE 1 TABLET DAILY BEFORE BREAKFAST   [DISCONTINUED] hydrALAZINE (APRESOLINE) 25 MG tablet Take 1 tablet (25 mg total) by mouth 3 (three) times daily.   No facility-administered encounter medications on file as of 05/06/2021.     Lab Results  Component Value Date   WBC 7.8 12/24/2020   HGB 12.9 12/24/2020   HCT 38.0 12/24/2020   PLT 196.0 12/24/2020   GLUCOSE 102 (H) 05/04/2021   CHOL 151 05/04/2021   TRIG 122.0 05/04/2021   HDL 56.50 05/04/2021   LDLDIRECT 64.9 12/08/2012   LDLCALC 70 05/04/2021   ALT 17 05/04/2021   AST 22 05/04/2021   NA 139 05/04/2021   K 4.2 05/04/2021   CL 104 05/04/2021   CREATININE 1.35 (H) 05/04/2021   BUN 19 05/04/2021   CO2 25 05/04/2021   TSH 2.52 12/24/2020   INR 1.03 04/07/2016   HGBA1C 5.6 05/04/2021    MM Outside Films Mammo  Result Date: 02/18/2021 This examination belongs to an outside facility and is stored here for comparison purposes only.  Contact the originating outside institution for any associated report or interpretation.  MM Outside Films Mammo  Result Date: 02/18/2021 This examination belongs to an outside facility and is stored here for comparison purposes only.  Contact the originating outside institution for any associated report or interpretation.  MM Outside Films Mammo  Result Date: 02/18/2021 This examination belongs to an outside facility and is stored here for comparison purposes only.  Contact the originating outside institution for any associated report or interpretation.  MM Outside Films Mammo  Result Date: 02/18/2021 This examination belongs to an outside facility and is stored here for comparison purposes only.  Contact the originating outside institution for any associated report or interpretation.  MM  Outside Films Mammo  Result Date: 02/18/2021 This examination belongs to an outside facility and is stored here for comparison purposes only.  Contact the originating outside  institution for any associated report or interpretation.  MM Outside Films Mammo  Result Date: 02/18/2021 This examination belongs to an outside facility and is stored here for comparison purposes only.  Contact the originating outside institution for any associated report or interpretation.  MM Outside Films Mammo  Result Date: 02/18/2021 This examination belongs to an outside facility and is stored here for comparison purposes only.  Contact the originating outside institution for any associated report or interpretation.      Assessment & Plan:   Problem List Items Addressed This Visit     Aortic atherosclerosis ([place])    Continue lipitor.        Relevant Medications   hydrALAZINE (APRESOLINE) 25 MG tablet   CKD (chronic kidney disease), stage III ([place])    Unable to take ACE inhibitor secondary to reaction (swelling).  Continue to avoid anti-inflammatories.  Blood pressure overall improved as outlined.  Continue current medication regimen as outlined.  Follow metabolic panel.  Continue follow-up with nephrology.      GERD (gastroesophageal reflux disease)    No upper symptoms reported on Protonix.      Hypercholesterolemia    Continue on Lipitor.  Low-cholesterol diet and exercise.  Follow lipid panel liver function testing.      Relevant Medications   hydrALAZINE (APRESOLINE) 25 MG tablet   Other Relevant Orders   Hepatic function panel   Lipid panel   Hyperglycemia    Low-carb diet and exercise.  Follow met b and A1c.      Relevant Orders   Hemoglobin A1c   Hypertension    Blood pressure overall under better control.  Continue amlodipine, metoprolol and hydralazine.  Hydralazine just increased to 25 mg 3 times daily last visit.  Continue to follow pressures.  No changes today.  Follow metabolic panel.      Relevant Medications   hydrALAZINE (APRESOLINE) 25 MG tablet   Other Relevant Orders   Basic metabolic panel   Hypothyroidism    On thyroid replacement.   Follow TSH.      IBS (irritable bowel syndrome)    Align helped her bowel issues some.  Discussed Benefiber.  Follow-up.      Leg swelling    Continue compression hose.  Continue leg elevation.      Right carotid bruit - Primary    Right carotid bruit noticed on exam.  Schedule follow-up carotid ultrasound.      Relevant Orders   VAS US CAROTID     Einar Pheasant, MD

## 2021-05-11 ENCOUNTER — Encounter: Payer: Self-pay | Admitting: Internal Medicine

## 2021-05-11 DIAGNOSIS — I700 Atherosclerosis of aorta: Secondary | ICD-10-CM | POA: Insufficient documentation

## 2021-05-11 NOTE — Assessment & Plan Note (Signed)
Align helped her bowel issues some.  Discussed Benefiber.  Follow-up.

## 2021-05-11 NOTE — Assessment & Plan Note (Signed)
Low-carb diet and exercise.  Follow met b and A1c. ?

## 2021-05-11 NOTE — Assessment & Plan Note (Signed)
Continue compression hose.  Continue leg elevation.

## 2021-05-11 NOTE — Assessment & Plan Note (Signed)
Unable to take ACE inhibitor secondary to reaction (swelling).  Continue to avoid anti-inflammatories.  Blood pressure overall improved as outlined.  Continue current medication regimen as outlined.  Follow metabolic panel.  Continue follow-up with nephrology.

## 2021-05-11 NOTE — Assessment & Plan Note (Signed)
Continue on Lipitor.  Low-cholesterol diet and exercise.  Follow lipid panel liver function testing. 

## 2021-05-11 NOTE — Assessment & Plan Note (Signed)
On thyroid replacement.  Follow TSH 

## 2021-05-11 NOTE — Assessment & Plan Note (Signed)
Right carotid bruit noticed on exam.  Schedule follow-up carotid ultrasound.

## 2021-05-11 NOTE — Assessment & Plan Note (Signed)
No upper symptoms reported on Protonix.

## 2021-05-11 NOTE — Assessment & Plan Note (Signed)
Continue lipitor  ?

## 2021-05-11 NOTE — Assessment & Plan Note (Signed)
Blood pressure overall under better control.  Continue amlodipine, metoprolol and hydralazine.  Hydralazine just increased to 25 mg 3 times daily last visit.  Continue to follow pressures.  No changes today.  Follow metabolic panel.

## 2021-05-20 ENCOUNTER — Other Ambulatory Visit: Payer: Self-pay

## 2021-05-20 ENCOUNTER — Other Ambulatory Visit: Payer: Self-pay | Admitting: Internal Medicine

## 2021-05-20 ENCOUNTER — Ambulatory Visit (INDEPENDENT_AMBULATORY_CARE_PROVIDER_SITE_OTHER): Payer: Medicare Other

## 2021-05-20 DIAGNOSIS — R0989 Other specified symptoms and signs involving the circulatory and respiratory systems: Secondary | ICD-10-CM | POA: Diagnosis not present

## 2021-05-26 ENCOUNTER — Ambulatory Visit: Payer: 59

## 2021-05-26 DIAGNOSIS — N1832 Chronic kidney disease, stage 3b: Secondary | ICD-10-CM | POA: Diagnosis not present

## 2021-05-27 ENCOUNTER — Encounter: Payer: Self-pay | Admitting: Internal Medicine

## 2021-05-27 DIAGNOSIS — I779 Disorder of arteries and arterioles, unspecified: Secondary | ICD-10-CM | POA: Insufficient documentation

## 2021-05-28 NOTE — Progress Notes (Signed)
Left message for patient to return call back for [place] results.

## 2021-06-01 DIAGNOSIS — I10 Essential (primary) hypertension: Secondary | ICD-10-CM | POA: Diagnosis not present

## 2021-06-01 DIAGNOSIS — N1832 Chronic kidney disease, stage 3b: Secondary | ICD-10-CM | POA: Diagnosis not present

## 2021-06-01 DIAGNOSIS — R600 Localized edema: Secondary | ICD-10-CM | POA: Diagnosis not present

## 2021-06-11 ENCOUNTER — Other Ambulatory Visit: Payer: Self-pay | Admitting: Internal Medicine

## 2021-06-30 DIAGNOSIS — Z23 Encounter for immunization: Secondary | ICD-10-CM | POA: Diagnosis not present

## 2021-07-29 ENCOUNTER — Ambulatory Visit (INDEPENDENT_AMBULATORY_CARE_PROVIDER_SITE_OTHER): Payer: Medicare Other

## 2021-07-29 VITALS — Ht 62.0 in | Wt 157.0 lb

## 2021-07-29 DIAGNOSIS — Z0000 Encounter for general adult medical examination without abnormal findings: Secondary | ICD-10-CM

## 2021-07-29 NOTE — Progress Notes (Signed)
Subjective:   Elizabeth Henderson is a 87 y.o. female who presents for Medicare Annual (Subsequent) preventive examination.  Review of Systems    No ROS.  Medicare Wellness Virtual Visit.  Visual/audio telehealth visit, UTA vital signs.   See social history for additional risk factors.   Cardiac Risk Factors include: advanced age (>52men, >82 women);hypertension     Objective:    Today's Vitals   07/29/21 1447  Weight: 157 lb (71.2 kg)  Height: 5\' 2"  (1.575 m)   Body mass index is 28.72 kg/m.  Advanced Directives 07/29/2021 05/22/2020 11/09/2017 11/04/2017 10/12/2017 04/04/2017 11/01/2016  Does Patient Have a Medical Advance Directive? No No No No No No No  Type of Advance Directive - - - - - - -  Does patient want to make changes to medical advance directive? - - - - - - -  Copy of [place] in Chart? - - - - - - -  Would patient like information on creating a medical advance directive? No - Patient declined No - Patient declined No - Patient declined No - Patient declined No - Patient declined - No - Patient declined    Current Medications (verified) Outpatient Encounter Medications as of 07/29/2021  Medication Sig   amitriptyline (ELAVIL) 25 MG tablet Take 1 tablet (25 mg total) by mouth at bedtime.   amLODipine (NORVASC) 10 MG tablet TAKE 1 TABLET DAILY   aspirin 81 MG tablet Take 81 mg by mouth daily.   atorvastatin (LIPITOR) 40 MG tablet TAKE 1 TABLET DAILY   Calcium Carbonate-Vitamin D 600-400 MG-UNIT tablet Take 1 tablet by mouth daily.   Cholecalciferol (VITAMIN D3) 1000 units CAPS Take by mouth.   hydrALAZINE (APRESOLINE) 25 MG tablet Take 1 tablet (25 mg total) by mouth 3 (three) times daily.   metoprolol succinate (TOPROL-XL) 50 MG 24 hr tablet TAKE 1 TABLET TWICE A DAY  WITH OR IMMEDIATELY        FOLLOWING MEALS   pantoprazole (PROTONIX) 40 MG tablet Take 1 tablet (40 mg total) by mouth daily.   SYNTHROID 75 MCG tablet TAKE 1 TABLET DAILY BEFORE  BREAKFAST   No facility-administered encounter medications on file as of 07/29/2021.    Allergies (verified) Ace inhibitors, Prinivil [lisinopril], Rofecoxib, Linseed oil, Mercury, Ofloxacin, and Sulfa antibiotics   History: Past Medical History:  Diagnosis Date   Arthritis    lower back, hands   Benign esophageal stricture    Complication of anesthesia    Diverticulosis    Diverticulosis    Dysphagia    Family history of adverse reaction to anesthesia    brother had a hard time waking up with hernia surgery as a child.   GERD (gastroesophageal reflux disease)    Headache(784.0)    h/o recurrent vascular headaches. no longer gets these   Hypercholesterolemia    Hypertension    Hypothyroidism    IBS (irritable bowel syndrome)    Leg swelling    Neck fullness 2016   dr. Nicki Reaper reviewed and found it to be nothing.  prinivil seemed to cause face and neck and tongue swelling   PONV (postoperative nausea and vomiting)    Transient global amnesia    x1, over 25 yrs ago after migraine   Urinary tract bacterial infections    Vertigo    x1 - several yrs ago.  OK after Epley maneuver   Past Surgical History:  Procedure Laterality Date   ABDOMINAL HYSTERECTOMY  APPENDECTOMY     CATARACT EXTRACTION W/PHACO Right 10/12/2017   Procedure: CATARACT EXTRACTION PHACO AND INTRAOCULAR LENS PLACEMENT ([place]) RIGHT;  Surgeon: Leandrew Koyanagi, MD;  Location: [place];  Service: Ophthalmology;  Laterality: Right;   CATARACT EXTRACTION W/PHACO Left 11/09/2017   Procedure: CATARACT EXTRACTION PHACO AND INTRAOCULAR LENS PLACEMENT ([place]) LEFT toric;  Surgeon: Leandrew Koyanagi, MD;  Location: [place];  Service: Ophthalmology;  Laterality: Left;   CHOLECYSTECTOMY  1990   CYSTOSCOPY     x2   ESOPHAGOGASTRODUODENOSCOPY (EGD) WITH PROPOFOL N/A 08/28/2015   Procedure: ESOPHAGOGASTRODUODENOSCOPY (EGD) WITH PROPOFOL;  Surgeon: Hulen Luster, MD;  Location: [facility] ENDOSCOPY;  Service:  Gastroenterology;  Laterality: N/A;   ESOPHAGOGASTRODUODENOSCOPY (EGD) WITH PROPOFOL N/A 04/04/2017   Procedure: ESOPHAGOGASTRODUODENOSCOPY (EGD) WITH PROPOFOL;  Surgeon: Lollie Sails, MD;  Location: [facility] ENDOSCOPY;  Service: Endoscopy;  Laterality: N/A;   PARTIAL HYSTERECTOMY     PARTIAL KNEE ARTHROPLASTY Left 04/15/2016   Procedure: UNICOMPARTMENTAL KNEE;  Surgeon: Corky Mull, MD;  Location: [facility];  Service: Orthopedics;  Laterality: Left;   TONSILLECTOMY     Family History  Problem Relation Age of Onset   Heart disease Father    Heart disease Mother    Heart disease Brother    Heart disease Brother    Breast cancer Neg Hx    Colon cancer Neg Hx    Social History   Socioeconomic History   Marital status: Married    Spouse name: Not on file   Number of children: 2   Years of education: Not on file   Highest education level: Not on file  Occupational History   Occupation: retired  Tobacco Use   Smoking status: Never   Smokeless tobacco: Never  Vaping Use   Vaping Use: Never used  Substance and Sexual Activity   Alcohol use: No    Alcohol/week: 0.0 standard drinks    Comment: seldom, a glass of wine 1-3 per year.   Drug use: No   Sexual activity: Not Currently  Other Topics Concern   Not on file  Social History Narrative   Not on file   Social Determinants of Health   Financial Resource Strain: Low Risk    Difficulty of Paying Living Expenses: Not hard at all  Food Insecurity: No Food Insecurity   Worried About Running Out of Food in the Last Year: Never true   [place] in the Last Year: Never true  Transportation Needs: No Transportation Needs   Lack of Transportation (Medical): No   Lack of Transportation (Non-Medical): No  Physical Activity: Not on file  Stress: No Stress Concern Present   Feeling of Stress : Not at all  Social Connections: Unknown   Frequency of Communication with Friends and Family: More than three times a week   Frequency  of Social Gatherings with Friends and Family: More than three times a week   Attends Religious Services: More than 4 times per year   Active Member of [employer] or Organizations: Not on file   Attends Archivist Meetings: Not on file   Marital Status: Married    Tobacco Counseling Counseling given: Not Answered   Clinical Intake:  Pre-visit preparation completed: Yes        Diabetes: No  How often do you need to have someone help you when you read instructions, pamphlets, or other written materials from your doctor or pharmacy?: 1 - Never    Interpreter Needed?: No  Activities of Daily Living In your present state of health, do you have any difficulty performing the following activities: 07/29/2021  Hearing? N  Vision? N  Difficulty concentrating or making decisions? N  Walking or climbing stairs? N  Dressing or bathing? N  Doing errands, shopping? N  Preparing Food and eating ? N  Using the Toilet? N  In the past six months, have you accidently leaked urine? N  Do you have problems with loss of bowel control? N  Managing your Medications? N  Managing your Finances? N  Housekeeping or managing your Housekeeping? N  Comment Husband assist as needed  Some recent data might be hidden    Patient Care Team: Einar Pheasant, MD as PCP - General (Internal Medicine)  Indicate any recent Medical Services you may have received from other than [facility] providers in the past year (date may be approximate).     Assessment:   This is a routine wellness examination for [place].  Virtual Visit via Telephone Note  I connected with  Elizabeth Henderson on 07/29/21 at  2:45 PM EST by telephone and verified that I am speaking with the correct person using two identifiers.  Location: Patient: home Provider: office Persons participating in the virtual visit: patient/Nurse Health Advisor   I discussed the limitations, risks, security and privacy concerns of performing an  evaluation and management service by telephone and the availability of in person appointments. The patient expressed understanding and agreed to proceed.  Interactive audio and video telecommunications were attempted between this nurse and patient, however failed, due to patient having technical difficulties OR patient did not have access to video capability.  We continued and completed visit with audio only.  Some vital signs may be absent or patient reported.   OBrien-Blaney, Reyanne Hussar L, LPN   Hearing/Vision screen Hearing Screening - Comments:: Patient is able to hear conversational tones without difficulty.  No issues reported.   Vision Screening - Comments:: Followed by [facility]  Wears corrective lenses  Annual visits  Cataract extraction, bilateral  They have regular follow up with the ophthalmologist  Dietary issues and exercise activities discussed: Current Exercise Habits: Home exercise routine, Intensity: Mild Low salt/heart healthy diet Good water intake    Goals Addressed             This Visit's Progress    Maintain Healthy Lifestyle       Stay hydrated! Drink plenty of fluids/water Stay active and continue home exercises. Walk as tolerated Low carb foods        Depression Screen PHQ 2/9 Scores 07/29/2021 08/27/2020 05/22/2020 07/20/2019 11/04/2017 11/01/2016 07/09/2016  PHQ - 2 Score 0 0 0 0 0 0 0    Fall Risk Fall Risk  07/29/2021 08/27/2020 05/22/2020 07/20/2019 11/04/2017  Falls in the past year? 0 0 0 0 No  Number falls in past yr: 0 0 0 - -  Injury with Fall? 0 0 - - -  Follow up Falls evaluation completed Falls evaluation completed Falls evaluation completed Falls evaluation completed -    FALL RISK PREVENTION PERTAINING TO THE HOME: Adequate lighting in your home to reduce risk of falls? Yes   ASSISTIVE DEVICES UTILIZED TO PREVENT FALLS:  Life alert? No  Use of a cane, walker or w/c? No  Grab bars in the bathroom? Yes  Shower chair or  bench in shower? No  Comfort chair height toilet? Yes   TIMED UP AND GO: Was the test performed? No .  Cognitive Function: MMSE - Mini Mental State Exam 11/04/2017 11/01/2016 10/27/2015  Orientation to time 5 5 5   Orientation to Place 5 5 5   Registration 3 3 3   Attention/ Calculation 5 5 5   Recall 2 3 3   Language- name 2 objects 2 2 2   Language- repeat 1 1 1   Language- follow 3 step command 3 3 3   Language- read & follow direction 1 1 1   Write a sentence 1 1 1   Copy design 1 1 1   Total score 29 30 30      6CIT Screen 07/29/2021 05/22/2020  What Year? 0 points 0 points  What month? 0 points 0 points  What time? 0 points 0 points  Count back from 20 0 points -  Months in reverse 0 points -  Repeat phrase 0 points -  Total Score 0 -    Immunizations Immunization History  Administered Date(s) Administered   Fluad Quad(high Dose 65+) 08/01/2020   Influenza Split 07/17/2013   Influenza, High Dose Seasonal PF 07/09/2016, 05/31/2017, 06/26/2018, 06/04/2019, 06/30/2021   Influenza,inj,Quad PF,6+ Mos 06/20/2014, 07/02/2015   Influenza-Unspecified 07/23/2012, 06/12/2013, 06/20/2014, 07/02/2015, 07/09/2016   Moderna Covid-19 Vaccine Bivalent Booster 42yrs & up 07/24/2021   Moderna Sars-Covid-2 Vaccination 10/17/2019, 11/14/2019, 07/25/2020   Pneumococcal Conjugate-13 12/17/2013   Pneumococcal Polysaccharide-23 01/28/2017   Zoster Recombinat (Shingrix) 04/07/2020, 06/13/2020   Zoster, Live 12/23/2011   TDAP status: Due, Education has been provided regarding the importance of this vaccine. Advised may receive this vaccine at local pharmacy or Health Dept. Aware to provide a copy of the vaccination record if obtained from local pharmacy or Health Dept. Verbalized acceptance and understanding. Deferred.  Screening Tests Health Maintenance  Topic Date Due   TETANUS/TDAP  09/09/2021 (Originally 07/24/1951)   MAMMOGRAM  02/17/2022   Pneumonia Vaccine 10+ Years old  Completed   INFLUENZA  VACCINE  Completed   DEXA SCAN  Completed   COVID-19 Vaccine  Completed   Zoster Vaccines- Shingrix  Completed   HPV VACCINES  Aged Out    Health Maintenance There are no preventive care reminders to display for this patient.  Lung Cancer Screening: (Low Dose CT Chest recommended if Age 22-80 years, 30 pack-year currently smoking OR have quit w/in 15years.) does not qualify.   Hepatitis C Screening: does not qualify.  Vision Screening: Recommended annual ophthalmology exams for early detection of glaucoma and other disorders of the eye.  Dental Screening: Recommended annual dental exams for proper oral hygiene  Community Resource Referral / Chronic Care Management: CRR required this visit?  No   CCM required this visit?  No      Plan:   Keep all routine maintenance appointments.   I have personally reviewed and noted the following in the patient's chart:   Medical and social history Use of alcohol, tobacco or illicit drugs  Current medications and supplements including opioid prescriptions. Not taking opioid.  Functional ability and status Nutritional status Physical activity Advanced directives List of other physicians Hospitalizations, surgeries, and ER visits in previous 12 months Vitals Screenings to include cognitive, depression, and falls Referrals and appointments  In addition, I have reviewed and discussed with patient certain preventive protocols, quality metrics, and best practice recommendations. A written personalized care plan for preventive services as well as general preventive health recommendations were provided to patient.     Varney Biles, LPN   08/21/4824

## 2021-07-29 NOTE — Patient Instructions (Addendum)
  Elizabeth Henderson , Thank you for taking time to come for your Medicare Wellness Visit. I appreciate your ongoing commitment to your health goals. Please review the following plan we discussed and let me know if I can assist you in the future.   These are the goals we discussed:  Goals      Maintain Healthy Lifestyle     Stay hydrated! Drink plenty of fluids/water Stay active and continue home exercises. Walk as tolerated Low carb foods         This is a list of the screening recommended for you and due dates:  Health Maintenance  Topic Date Due   Tetanus Vaccine  09/09/2021*   Mammogram  02/17/2022   Pneumonia Vaccine  Completed   Flu Shot  Completed   DEXA scan (bone density measurement)  Completed   COVID-19 Vaccine  Completed   Zoster (Shingles) Vaccine  Completed   HPV Vaccine  Aged Out  *Topic was postponed. The date shown is not the original due date.

## 2021-08-10 DIAGNOSIS — Z23 Encounter for immunization: Secondary | ICD-10-CM | POA: Diagnosis not present

## 2021-08-20 ENCOUNTER — Other Ambulatory Visit: Payer: Self-pay | Admitting: Internal Medicine

## 2021-09-05 ENCOUNTER — Other Ambulatory Visit: Payer: Self-pay | Admitting: Internal Medicine

## 2021-09-07 ENCOUNTER — Other Ambulatory Visit: Payer: 59

## 2021-09-08 ENCOUNTER — Other Ambulatory Visit: Payer: Self-pay

## 2021-09-08 ENCOUNTER — Other Ambulatory Visit (INDEPENDENT_AMBULATORY_CARE_PROVIDER_SITE_OTHER): Payer: Medicare Other

## 2021-09-08 DIAGNOSIS — E7800 Pure hypercholesterolemia, unspecified: Secondary | ICD-10-CM

## 2021-09-08 DIAGNOSIS — R739 Hyperglycemia, unspecified: Secondary | ICD-10-CM

## 2021-09-08 DIAGNOSIS — I10 Essential (primary) hypertension: Secondary | ICD-10-CM

## 2021-09-08 LAB — HEPATIC FUNCTION PANEL
ALT: 13 U/L (ref 0–35)
AST: 20 U/L (ref 0–37)
Albumin: 4 g/dL (ref 3.5–5.2)
Alkaline Phosphatase: 103 U/L (ref 39–117)
Bilirubin, Direct: 0.1 mg/dL (ref 0.0–0.3)
Total Bilirubin: 0.7 mg/dL (ref 0.2–1.2)
Total Protein: 6.7 g/dL (ref 6.0–8.3)

## 2021-09-08 LAB — LIPID PANEL
Cholesterol: 144 mg/dL (ref 0–200)
HDL: 54 mg/dL (ref >39.00)
LDL Cholesterol: 59 mg/dL (ref 0–99)
NonHDL: 90.01
Total CHOL/HDL Ratio: 3
Triglycerides: 154 mg/dL — ABNORMAL HIGH (ref 0.0–149.0)
VLDL: 30.8 mg/dL (ref 0.0–40.0)

## 2021-09-08 LAB — BASIC METABOLIC PANEL
BUN: 17 mg/dL (ref 6–23)
CO2: 28 mEq/L (ref 19–32)
Calcium: 9.7 mg/dL (ref 8.4–10.5)
Chloride: 99 mEq/L (ref 96–112)
Creatinine, Ser: 1.3 mg/dL — ABNORMAL HIGH (ref 0.40–1.20)
GFR: 37.07 mL/min — ABNORMAL LOW (ref >60.00)
Glucose, Bld: 103 mg/dL — ABNORMAL HIGH (ref 70–99)
Potassium: 4.2 mEq/L (ref 3.5–5.1)
Sodium: 133 mEq/L — ABNORMAL LOW (ref 135–145)

## 2021-09-08 LAB — HEMOGLOBIN A1C: Hgb A1c MFr Bld: 5.6 % (ref 4.6–6.5)

## 2021-09-09 ENCOUNTER — Ambulatory Visit (INDEPENDENT_AMBULATORY_CARE_PROVIDER_SITE_OTHER): Payer: Medicare Other | Admitting: Internal Medicine

## 2021-09-09 ENCOUNTER — Other Ambulatory Visit: Payer: Self-pay

## 2021-09-09 DIAGNOSIS — I779 Disorder of arteries and arterioles, unspecified: Secondary | ICD-10-CM | POA: Diagnosis not present

## 2021-09-09 DIAGNOSIS — N183 Chronic kidney disease, stage 3 unspecified: Secondary | ICD-10-CM

## 2021-09-09 DIAGNOSIS — E7800 Pure hypercholesterolemia, unspecified: Secondary | ICD-10-CM

## 2021-09-09 DIAGNOSIS — I700 Atherosclerosis of aorta: Secondary | ICD-10-CM

## 2021-09-09 DIAGNOSIS — E871 Hypo-osmolality and hyponatremia: Secondary | ICD-10-CM

## 2021-09-09 DIAGNOSIS — I10 Essential (primary) hypertension: Secondary | ICD-10-CM | POA: Diagnosis not present

## 2021-09-09 DIAGNOSIS — K219 Gastro-esophageal reflux disease without esophagitis: Secondary | ICD-10-CM

## 2021-09-09 DIAGNOSIS — K5900 Constipation, unspecified: Secondary | ICD-10-CM | POA: Diagnosis not present

## 2021-09-09 DIAGNOSIS — E039 Hypothyroidism, unspecified: Secondary | ICD-10-CM

## 2021-09-09 DIAGNOSIS — R739 Hyperglycemia, unspecified: Secondary | ICD-10-CM

## 2021-09-09 DIAGNOSIS — R079 Chest pain, unspecified: Secondary | ICD-10-CM | POA: Diagnosis not present

## 2021-09-09 NOTE — Progress Notes (Signed)
[name], female   DOB: [birth year], 87 y.o.   MRN: [id number]   Subjective:    [name], female    DOB: Oct 20, 1931, 87 y.o.   MRN: [id number]  This visit occurred during the SARS-CoV-2 public health emergency.  Safety protocols were in place, including screening questions prior to the visit, additional usage of staff PPE, and extensive cleaning of exam room while observing appropriate contact time as indicated for disinfecting solutions.   Patient here for a scheduled follow up.   Chief Complaint  Patient presents with   Hyperlipidemia   Hypertension   Hypothyroidism   .   HPI Reports doing relatively well.  Tries to stay active.  Has noticed if she rushes getting a little more winded.  No chest pain with increased activity or exertion.  States had an episode previously when sitting - noticed right side chest pain.  Only lasted for a brief time.  Did not take her protonix the morning occurred - felt related to this.  Has not had any pain since.  Overall feels breathing is stable.  No chest congestion or cough.  States acid reflux controlled with PPI.  No abdominal pain.  Bowels moving - taking benefiber.  Felt citrucel worked better.  Discussed changing back to citrucel.     Past Medical History:  Diagnosis Date   Arthritis    lower back, hands   Benign esophageal stricture    Complication of anesthesia    Diverticulosis    Diverticulosis    Dysphagia    Family history of adverse reaction to anesthesia    brother had a hard time waking up with hernia surgery as a child.   GERD (gastroesophageal reflux disease)    Headache(784.0)    h/o recurrent vascular headaches. no longer gets these   Hypercholesterolemia    Hypertension    Hypothyroidism    IBS (irritable bowel syndrome)    Leg swelling    Neck fullness 2016   dr. Nicki Reaper reviewed and found it to be nothing.  prinivil seemed to cause face and neck and tongue swelling   PONV (postoperative  nausea and vomiting)    Transient global amnesia    x1, over 25 yrs ago after migraine   Urinary tract bacterial infections    Vertigo    x1 - several yrs ago.  OK after Epley maneuver   Past Surgical History:  Procedure Laterality Date   ABDOMINAL HYSTERECTOMY     APPENDECTOMY     CATARACT EXTRACTION W/PHACO Right 10/12/2017   Procedure: CATARACT EXTRACTION PHACO AND INTRAOCULAR LENS PLACEMENT ([place]) RIGHT;  Surgeon: Leandrew Koyanagi, MD;  Location: [place];  Service: Ophthalmology;  Laterality: Right;   CATARACT EXTRACTION W/PHACO Left 11/09/2017   Procedure: CATARACT EXTRACTION PHACO AND INTRAOCULAR LENS PLACEMENT ([place]) LEFT toric;  Surgeon: Leandrew Koyanagi, MD;  Location: [place];  Service: Ophthalmology;  Laterality: Left;   CHOLECYSTECTOMY  1990   CYSTOSCOPY     x2   ESOPHAGOGASTRODUODENOSCOPY (EGD) WITH PROPOFOL N/A 08/28/2015   Procedure: ESOPHAGOGASTRODUODENOSCOPY (EGD) WITH PROPOFOL;  Surgeon: Hulen Luster, MD;  Location: [facility] ENDOSCOPY;  Service: Gastroenterology;  Laterality: N/A;   ESOPHAGOGASTRODUODENOSCOPY (EGD) WITH PROPOFOL N/A 04/04/2017   Procedure: ESOPHAGOGASTRODUODENOSCOPY (EGD) WITH PROPOFOL;  Surgeon: Lollie Sails, MD;  Location: [facility] ENDOSCOPY;  Service: Endoscopy;  Laterality: N/A;   PARTIAL HYSTERECTOMY     PARTIAL KNEE ARTHROPLASTY Left 04/15/2016   Procedure: UNICOMPARTMENTAL KNEE;  Surgeon: Jenny Reichmann  Robby Sermon, MD;  Location: [facility]S;  Service: Orthopedics;  Laterality: Left;   TONSILLECTOMY     Family History  Problem Relation Age of Onset   Heart disease Father    Heart disease Mother    Heart disease Brother    Heart disease Brother    Breast cancer Neg Hx    Colon cancer Neg Hx    Social History   Socioeconomic History   Marital status: Married    Spouse name: Not on file   Number of children: 2   Years of education: Not on file   Highest education level: Not on file  Occupational History   Occupation: retired   Tobacco Use   Smoking status: Never   Smokeless tobacco: Never  Vaping Use   Vaping Use: Never used  Substance and Sexual Activity   Alcohol use: No    Alcohol/week: 0.0 standard drinks    Comment: seldom, a glass of wine 1-3 per year.   Drug use: No   Sexual activity: Not Currently  Other Topics Concern   Not on file  Social History Narrative   Not on file   Social Determinants of Health   Financial Resource Strain: Low Risk    Difficulty of Paying Living Expenses: Not hard at all  Food Insecurity: No Food Insecurity   Worried About Running Out of Food in the Last Year: Never true   Hunker in the Last Year: Never true  Transportation Needs: No Transportation Needs   Lack of Transportation (Medical): No   Lack of Transportation (Non-Medical): No  Physical Activity: Not on file  Stress: No Stress Concern Present   Feeling of Stress : Not at all  Social Connections: Unknown   Frequency of Communication with Friends and Family: More than three times a week   Frequency of Social Gatherings with Friends and Family: More than three times a week   Attends Religious Services: More than 4 times per year   Active Member of [employer] or Organizations: Not on file   Attends Archivist Meetings: Not on file   Marital Status: Married     Review of Systems  Constitutional:  Negative for appetite change and unexpected weight change.  HENT:  Negative for congestion and sinus pressure.   Respiratory:  Negative for cough and chest tightness.        Breathing overall stable.   Cardiovascular:  Negative for chest pain, palpitations and leg swelling.  Gastrointestinal:  Negative for abdominal pain, diarrhea, nausea and vomiting.  Genitourinary:  Negative for difficulty urinating and dysuria.  Musculoskeletal:  Negative for joint swelling and myalgias.  Skin:  Negative for color change and rash.  Neurological:  Negative for dizziness, light-headedness and headaches.   Psychiatric/Behavioral:  Negative for agitation and dysphoric mood.       Objective:     BP 120/68    Pulse 81    Temp (!) 97 F (36.1 C)    Resp 16    Ht _0  (1.575 m)    Wt 157 lb (71.2 kg)    SpO2 98%    BMI 28.72 kg/m  Wt Readings from Last 3 Encounters:  09/09/21 157 lb (71.2 kg)  07/29/21 157 lb (71.2 kg)  05/06/21 157 lb (71.2 kg)    Physical Exam Vitals reviewed.  Constitutional:      General: She is not in acute distress.    Appearance: Normal appearance.  HENT:  Head: Normocephalic and atraumatic.     Right Ear: External ear normal.     Left Ear: External ear normal.  Eyes:     General: No scleral icterus.       Right eye: No discharge.        Left eye: No discharge.     Conjunctiva/sclera: Conjunctivae normal.  Neck:     Thyroid: No thyromegaly.  Cardiovascular:     Rate and Rhythm: Normal rate and regular rhythm.  Pulmonary:     Effort: No respiratory distress.     Breath sounds: Normal breath sounds. No wheezing.  Abdominal:     General: Bowel sounds are normal.     Palpations: Abdomen is soft.     Tenderness: There is no abdominal tenderness.  Musculoskeletal:        General: No swelling or tenderness.     Cervical back: Neck supple. No tenderness.  Lymphadenopathy:     Cervical: No cervical adenopathy.  Skin:    Findings: No erythema or rash.  Neurological:     Mental Status: She is alert.  Psychiatric:        Mood and Affect: Mood normal.        Behavior: Behavior normal.     Outpatient Encounter Medications as of 09/09/2021  Medication Sig   amitriptyline (ELAVIL) 25 MG tablet TAKE 1 TABLET AT BEDTIME   amLODipine (NORVASC) 10 MG tablet TAKE 1 TABLET DAILY   aspirin 81 MG tablet Take 81 mg by mouth daily.   atorvastatin (LIPITOR) 40 MG tablet TAKE 1 TABLET DAILY   Calcium Carbonate-Vitamin D 600-400 MG-UNIT tablet Take 1 tablet by mouth daily.   Cholecalciferol (VITAMIN D3) 1000 units CAPS Take by mouth.   hydrALAZINE (APRESOLINE)  25 MG tablet Take 1 tablet (25 mg total) by mouth 3 (three) times daily.   metoprolol succinate (TOPROL-XL) 50 MG 24 hr tablet TAKE 1 TABLET TWICE A DAY  WITH OR IMMEDIATELY        FOLLOWING MEALS   pantoprazole (PROTONIX) 40 MG tablet Take 1 tablet (40 mg total) by mouth daily.   SYNTHROID 75 MCG tablet TAKE 1 TABLET DAILY BEFORE BREAKFAST   No facility-administered encounter medications on file as of 09/09/2021.     Lab Results  Component Value Date   WBC 7.8 12/24/2020   HGB 12.9 12/24/2020   HCT 38.0 12/24/2020   PLT 196.0 12/24/2020   GLUCOSE 103 (H) 09/08/2021   CHOL 144 09/08/2021   TRIG 154.0 (H) 09/08/2021   HDL 54.00 09/08/2021   LDLDIRECT 64.9 12/08/2012   LDLCALC 59 09/08/2021   ALT 13 09/08/2021   AST 20 09/08/2021   NA 133 (L) 09/08/2021   K 4.2 09/08/2021   CL 99 09/08/2021   CREATININE 1.30 (H) 09/08/2021   BUN 17 09/08/2021   CO2 28 09/08/2021   TSH 2.52 12/24/2020   INR 1.03 04/07/2016   HGBA1C 5.6 09/08/2021    MM Outside Films Mammo  Result Date: 02/18/2021 This examination belongs to an outside facility and is stored here for comparison purposes only.  Contact the originating outside institution for any associated report or interpretation.  MM Outside Films Mammo  Result Date: 02/18/2021 This examination belongs to an outside facility and is stored here for comparison purposes only.  Contact the originating outside institution for any associated report or interpretation.  MM Outside Films Mammo  Result Date: 02/18/2021 This examination belongs to an outside facility and is stored here for comparison purposes  only.  Contact the originating outside institution for any associated report or interpretation.  MM Outside Films Mammo  Result Date: 02/18/2021 This examination belongs to an outside facility and is stored here for comparison purposes only.  Contact the originating outside institution for any associated report or interpretation.  MM Outside Films  Mammo  Result Date: 02/18/2021 This examination belongs to an outside facility and is stored here for comparison purposes only.  Contact the originating outside institution for any associated report or interpretation.  MM Outside Films Mammo  Result Date: 02/18/2021 This examination belongs to an outside facility and is stored here for comparison purposes only.  Contact the originating outside institution for any associated report or interpretation.  MM Outside Films Mammo  Result Date: 02/18/2021 This examination belongs to an outside facility and is stored here for comparison purposes only.  Contact the originating outside institution for any associated report or interpretation.      Assessment & Plan:   Problem List Items Addressed This Visit     Aortic atherosclerosis ([place])    Continue lipitor.        Carotid artery disease ([place])    05/20/21: Right Carotid: Velocities in the right ICA are consistent with a 1-39% stenosis.                The ECA appears <50% stenosed.   Left Carotid: Velocities in the left ICA are consistent with a 40-59% stenosis.               The ECA appears <50% stenosed.      Chest pain    Had the episode of chest pain as outlined.  One episode - sitting.  Was questioning if related to not taking protonix.  EKG - SR with no acute ischemic changes.  Has noticed some sob with rushing.  Overall breathing stable.  Discussed further cardiac w/up.  She wants to monitor.  Will notify or be reevaluated if symptoms persists, change or worsen.        Relevant Orders   EKG 12-Lead (Completed)   CKD (chronic kidney disease), stage III ([place])    Unable to take ACE inhibitor secondary to reaction (swelling).  Continue to avoid anti-inflammatories.  Blood pressure overall improved as outlined.  Continue current medication regimen as outlined.  Follow metabolic panel.  Continue follow-up with nephrology.      Constipation    Change back to citrucel.  Follow.        GERD (gastroesophageal reflux disease)    If takes protonix regularly - no symptoms.  Follow.       Hypercholesterolemia    Continue on Lipitor.  Low-cholesterol diet and exercise.  Follow lipid panel liver function testing.      Hyperglycemia    Low-carb diet and exercise.  Follow met b and A1c.      Hypertension    Blood pressure overall under better control.  Continue amlodipine, metoprolol and hydralazine.  Continue to follow pressures.  No changes today.  Follow metabolic panel.      Hyponatremia    Sodium slightly decreased on recent lab check.  Recheck sodium in a few weeks.        Relevant Orders   Sodium   Hypothyroidism    On thyroid replacement.  Follow TSH.        Einar Pheasant, MD

## 2021-09-10 ENCOUNTER — Encounter: Payer: Self-pay | Admitting: Internal Medicine

## 2021-09-10 DIAGNOSIS — D1801 Hemangioma of skin and subcutaneous tissue: Secondary | ICD-10-CM | POA: Diagnosis not present

## 2021-09-10 DIAGNOSIS — L570 Actinic keratosis: Secondary | ICD-10-CM | POA: Diagnosis not present

## 2021-09-10 DIAGNOSIS — L821 Other seborrheic keratosis: Secondary | ICD-10-CM | POA: Diagnosis not present

## 2021-09-10 DIAGNOSIS — L720 Epidermal cyst: Secondary | ICD-10-CM | POA: Diagnosis not present

## 2021-09-10 DIAGNOSIS — X32XXXA Exposure to sunlight, initial encounter: Secondary | ICD-10-CM | POA: Diagnosis not present

## 2021-09-10 NOTE — Assessment & Plan Note (Signed)
Sodium slightly decreased on recent lab check.  Recheck sodium in a few weeks.

## 2021-09-10 NOTE — Assessment & Plan Note (Signed)
Continue lipitor  ?

## 2021-09-10 NOTE — Assessment & Plan Note (Signed)
On thyroid replacement.  Follow TSH 

## 2021-09-10 NOTE — Assessment & Plan Note (Signed)
Change back to citrucel.  Follow.

## 2021-09-10 NOTE — Assessment & Plan Note (Signed)
Blood pressure overall under better control.  Continue amlodipine, metoprolol and hydralazine.  Continue to follow pressures.  No changes today.  Follow metabolic panel.

## 2021-09-10 NOTE — Assessment & Plan Note (Signed)
Had the episode of chest pain as outlined.  One episode - sitting.  Was questioning if related to not taking protonix.  EKG - SR with no acute ischemic changes.  Has noticed some sob with rushing.  Overall breathing stable.  Discussed further cardiac w/up.  She wants to monitor.  Will notify or be reevaluated if symptoms persists, change or worsen.

## 2021-09-10 NOTE — Assessment & Plan Note (Signed)
Low-carb diet and exercise.  Follow met b and A1c.

## 2021-09-10 NOTE — Assessment & Plan Note (Signed)
Continue on Lipitor.  Low-cholesterol diet and exercise.  Follow lipid panel liver function testing. 

## 2021-09-10 NOTE — Assessment & Plan Note (Signed)
Unable to take ACE inhibitor secondary to reaction (swelling).  Continue to avoid anti-inflammatories.  Blood pressure overall improved as outlined.  Continue current medication regimen as outlined.  Follow metabolic panel.  Continue follow-up with nephrology.

## 2021-09-10 NOTE — Assessment & Plan Note (Signed)
If takes protonix regularly - no symptoms.  Follow.  

## 2021-09-10 NOTE — Assessment & Plan Note (Signed)
05/20/21: Right Carotid: Velocities in the right ICA are consistent with a 1-39% stenosis.                The ECA appears <50% stenosed.  Left Carotid: Velocities in the left ICA are consistent with a 40-59% stenosis.               The ECA appears <50% stenosed.

## 2021-09-23 ENCOUNTER — Other Ambulatory Visit (INDEPENDENT_AMBULATORY_CARE_PROVIDER_SITE_OTHER): Payer: Medicare Other

## 2021-09-23 ENCOUNTER — Other Ambulatory Visit: Payer: Self-pay

## 2021-09-23 DIAGNOSIS — E871 Hypo-osmolality and hyponatremia: Secondary | ICD-10-CM

## 2021-09-24 LAB — SODIUM: Sodium: 135 mEq/L (ref 135–145)

## 2021-10-10 ENCOUNTER — Other Ambulatory Visit: Payer: Self-pay | Admitting: Internal Medicine

## 2021-11-09 ENCOUNTER — Other Ambulatory Visit: Payer: Self-pay | Admitting: Internal Medicine

## 2021-12-02 ENCOUNTER — Other Ambulatory Visit: Payer: Self-pay

## 2021-12-02 ENCOUNTER — Telehealth: Payer: Self-pay | Admitting: Internal Medicine

## 2021-12-02 MED ORDER — HYDRALAZINE HCL 25 MG PO TABS: 25.0000 mg | ORAL_TABLET | Freq: Three times a day (TID) | ORAL | 1 refills | Status: DC

## 2021-12-02 NOTE — Telephone Encounter (Signed)
Patient is requesting a refill on her hydrALAZINE (APRESOLINE) 25 MG tablet. ?

## 2021-12-02 NOTE — Telephone Encounter (Signed)
Rx sent in

## 2021-12-02 NOTE — Telephone Encounter (Signed)
Please send to mail order pharmacy. ?

## 2022-01-08 ENCOUNTER — Ambulatory Visit (INDEPENDENT_AMBULATORY_CARE_PROVIDER_SITE_OTHER): Payer: Medicare Other | Admitting: Internal Medicine

## 2022-01-08 ENCOUNTER — Encounter: Payer: Self-pay | Admitting: Internal Medicine

## 2022-01-08 VITALS — BP 128/62 | HR 75 | Temp 97.6°F | Resp 18 | Ht 62.0 in | Wt 158.0 lb

## 2022-01-08 DIAGNOSIS — I700 Atherosclerosis of aorta: Secondary | ICD-10-CM | POA: Diagnosis not present

## 2022-01-08 DIAGNOSIS — E7800 Pure hypercholesterolemia, unspecified: Secondary | ICD-10-CM | POA: Diagnosis not present

## 2022-01-08 DIAGNOSIS — R739 Hyperglycemia, unspecified: Secondary | ICD-10-CM | POA: Diagnosis not present

## 2022-01-08 DIAGNOSIS — N183 Chronic kidney disease, stage 3 unspecified: Secondary | ICD-10-CM | POA: Diagnosis not present

## 2022-01-08 DIAGNOSIS — I10 Essential (primary) hypertension: Secondary | ICD-10-CM

## 2022-01-08 DIAGNOSIS — Z1231 Encounter for screening mammogram for malignant neoplasm of breast: Secondary | ICD-10-CM | POA: Diagnosis not present

## 2022-01-08 DIAGNOSIS — K589 Irritable bowel syndrome without diarrhea: Secondary | ICD-10-CM | POA: Diagnosis not present

## 2022-01-08 DIAGNOSIS — I779 Disorder of arteries and arterioles, unspecified: Secondary | ICD-10-CM

## 2022-01-08 DIAGNOSIS — K219 Gastro-esophageal reflux disease without esophagitis: Secondary | ICD-10-CM

## 2022-01-08 DIAGNOSIS — E039 Hypothyroidism, unspecified: Secondary | ICD-10-CM

## 2022-01-08 DIAGNOSIS — Z0000 Encounter for general adult medical examination without abnormal findings: Secondary | ICD-10-CM | POA: Diagnosis not present

## 2022-01-08 MED ORDER — HYDRALAZINE HCL 50 MG PO TABS: 50.0000 mg | ORAL_TABLET | Freq: Two times a day (BID) | ORAL | 3 refills | Status: DC

## 2022-01-08 NOTE — Progress Notes (Signed)
[name], female   DOB: 1931/10/29, 88 y.o.   MRN: [id number] ? ? ?Subjective:  ? ? [name], female    DOB: 10-Dec-1931, 88 y.o.   MRN: [id number] ? ?This visit occurred during the SARS-CoV-2 public health emergency.  Safety protocols were in place, including screening questions prior to the visit, additional usage of staff PPE, and extensive cleaning of exam room while observing appropriate contact time as indicated for disinfecting solutions.  ?  ? ?HPI ?With past history of hypercholesterolemia and hypertension.  She comes in today to follow up on these issues as well as for a complete physical exam.  Reports she is doing relatively well.  Tries to stay active.  Blood pressures averaging 130/60s.  No chest pain or sob reported.  No abdominal pain.  Citrucel - bowels.   ? ? ?Past Medical History:  ?Diagnosis Date  ? Arthritis   ? lower back, hands  ? Benign esophageal stricture   ? Complication of anesthesia   ? Diverticulosis   ? Diverticulosis   ? Dysphagia   ? Family history of adverse reaction to anesthesia   ? brother had a hard time waking up with hernia surgery as a child.  ? GERD (gastroesophageal reflux disease)   ? Headache(784.0)   ? h/o recurrent vascular headaches. no longer gets these  ? Hypercholesterolemia   ? Hypertension   ? Hypothyroidism   ? IBS (irritable bowel syndrome)   ? Leg swelling   ? Neck fullness 2016  ? dr. Nicki Reaper reviewed and found it to be nothing.  prinivil seemed to cause face and neck and tongue swelling  ? PONV (postoperative nausea and vomiting)   ? Transient global amnesia   ? x1, over 25 yrs ago after migraine  ? Urinary tract bacterial infections   ? Vertigo   ? x1 - several yrs ago.  OK after Epley maneuver  ? ?Past Surgical History:  ?Procedure Laterality Date  ? ABDOMINAL HYSTERECTOMY    ? APPENDECTOMY    ? CATARACT EXTRACTION W/PHACO Right 10/12/2017  ? Procedure: CATARACT EXTRACTION PHACO AND INTRAOCULAR LENS PLACEMENT ([place]) RIGHT;   Surgeon: Leandrew Koyanagi, MD;  Location: [place];  Service: Ophthalmology;  Laterality: Right;  ? CATARACT EXTRACTION W/PHACO Left 11/09/2017  ? Procedure: CATARACT EXTRACTION PHACO AND INTRAOCULAR LENS PLACEMENT ([place]) LEFT toric;  Surgeon: Leandrew Koyanagi, MD;  Location: [place];  Service: Ophthalmology;  Laterality: Left;  ? CHOLECYSTECTOMY  1990  ? CYSTOSCOPY    ? x2  ? ESOPHAGOGASTRODUODENOSCOPY (EGD) WITH PROPOFOL N/A 08/28/2015  ? Procedure: ESOPHAGOGASTRODUODENOSCOPY (EGD) WITH PROPOFOL;  Surgeon: Hulen Luster, MD;  Location: [facility] ENDOSCOPY;  Service: Gastroenterology;  Laterality: N/A;  ? ESOPHAGOGASTRODUODENOSCOPY (EGD) WITH PROPOFOL N/A 04/04/2017  ? Procedure: ESOPHAGOGASTRODUODENOSCOPY (EGD) WITH PROPOFOL;  Surgeon: Lollie Sails, MD;  Location: [facility] ENDOSCOPY;  Service: Endoscopy;  Laterality: N/A;  ? PARTIAL HYSTERECTOMY    ? PARTIAL KNEE ARTHROPLASTY Left 04/15/2016  ? Procedure: UNICOMPARTMENTAL KNEE;  Surgeon: Corky Mull, MD;  Location: [facility] ORS;  Service: Orthopedics;  Laterality: Left;  ? TONSILLECTOMY    ? ?Family History  ?Problem Relation Age of Onset  ? Heart disease Father   ? Heart disease Mother   ? Heart disease Brother   ? Heart disease Brother   ? Breast cancer Neg Hx   ? Colon cancer Neg Hx   ? ?Social History  ? ?Socioeconomic History  ? Marital status: Married  ?  Spouse name: Not on file  ? Number of children: 2  ? Years of education: Not on file  ? Highest education level: Not on file  ?Occupational History  ? Occupation: retired  ?Tobacco Use  ? Smoking status: Never  ? Smokeless tobacco: Never  ?Vaping Use  ? Vaping Use: Never used  ?Substance and Sexual Activity  ? Alcohol use: No  ?  Alcohol/week: 0.0 standard drinks  ?  Comment: seldom, a glass of wine 1-3 per year.  ? Drug use: No  ? Sexual activity: Not Currently  ?Other Topics Concern  ? Not on file  ?Social History Narrative  ? Not on file  ? ?Social Determinants of Health  ? ?Financial  Resource Strain: Low Risk   ? Difficulty of Paying Living Expenses: Not hard at all  ?Food Insecurity: No Food Insecurity  ? Worried About Charity fundraiser in the Last Year: Never true  ? Ran Out of Food in the Last Year: Never true  ?Transportation Needs: No Transportation Needs  ? Lack of Transportation (Medical): No  ? Lack of Transportation (Non-Medical): No  ?Physical Activity: Not on file  ?Stress: No Stress Concern Present  ? Feeling of Stress : Not at all  ?Social Connections: Unknown  ? Frequency of Communication with Friends and Family: More than three times a week  ? Frequency of Social Gatherings with Friends and Family: More than three times a week  ? Attends Religious Services: More than 4 times per year  ? Active Member of Clubs or Organizations: Not on file  ? Attends Archivist Meetings: Not on file  ? Marital Status: Married  ? ? ? ?Review of Systems  ?Constitutional:  Negative for appetite change and unexpected weight change.  ?HENT:  Negative for congestion.   ?Respiratory:  Negative for cough, chest tightness and shortness of breath.   ?Cardiovascular:  Negative for chest pain and palpitations.  ?     No increased lower extremity swelling.   ?Gastrointestinal:  Negative for diarrhea, nausea and vomiting.  ?Genitourinary:  Negative for difficulty urinating and dysuria.  ?Musculoskeletal:  Negative for joint swelling and myalgias.  ?Skin:  Negative for color change and rash.  ?Neurological:  Negative for dizziness, light-headedness and headaches.  ?Psychiatric/Behavioral:  Negative for agitation and dysphoric mood.   ? ?   ?Objective:  ?  ? ?BP 128/62 (BP Location: Left Arm, Patient Position: Sitting, Cuff Size: Small)   Pulse 75   Temp 97.6 ?F (36.4 ?C) (Temporal)   Resp 18   Ht '5\' 2"'$  (1.575 m)   Wt 158 lb (71.7 kg)   SpO2 96%   BMI 28.90 kg/m?  ?Wt Readings from Last 3 Encounters:  ?01/08/22 158 lb (71.7 kg)  ?09/09/21 157 lb (71.2 kg)  ?07/29/21 157 lb (71.2 kg)   ? ? ?Physical Exam ?Vitals reviewed.  ?Constitutional:   ?   General: She is not in acute distress. ?   Appearance: Normal appearance. She is well-developed.  ?HENT:  ?   Head: Normocephalic and atraumatic.  ?   Right Ear: External ear normal.  ?   Left Ear: External ear normal.  ?Eyes:  ?   General: No scleral icterus.    ?   Right eye: No discharge.     ?   Left eye: No discharge.  ?   Conjunctiva/sclera: Conjunctivae normal.  ?Neck:  ?   Thyroid: No thyromegaly.  ?Cardiovascular:  ?   Rate and Rhythm:  Normal rate and regular rhythm.  ?Pulmonary:  ?   Effort: No tachypnea, accessory muscle usage or respiratory distress.  ?   Breath sounds: Normal breath sounds. No decreased breath sounds or wheezing.  ?Chest:  ?Breasts: ?   Right: No inverted nipple, mass, nipple discharge or tenderness (no axillary adenopathy).  ?   Left: No inverted nipple, mass, nipple discharge or tenderness (no axilarry adenopathy).  ?Abdominal:  ?   General: Bowel sounds are normal.  ?   Palpations: Abdomen is soft.  ?   Tenderness: There is no abdominal tenderness.  ?Musculoskeletal:     ?   General: No swelling or tenderness.  ?   Cervical back: Neck supple.  ?Lymphadenopathy:  ?   Cervical: No cervical adenopathy.  ?Skin: ?   Findings: No erythema or rash.  ?Neurological:  ?   Mental Status: She is alert and oriented to person, place, and time.  ?Psychiatric:     ?   Mood and Affect: Mood normal.     ?   Behavior: Behavior normal.  ? ? ? ?Outpatient Encounter Medications as of 01/08/2022  ?Medication Sig  ? amitriptyline (ELAVIL) 25 MG tablet TAKE 1 TABLET AT BEDTIME  ? amLODipine (NORVASC) 10 MG tablet TAKE 1 TABLET DAILY  ? aspirin 81 MG tablet Take 81 mg by mouth daily.  ? atorvastatin (LIPITOR) 40 MG tablet TAKE 1 TABLET DAILY  ? Calcium Carbonate-Vitamin D 600-400 MG-UNIT tablet Take 1 tablet by mouth daily.  ? Cholecalciferol (VITAMIN D3) 1000 units CAPS Take by mouth.  ? hydrALAZINE (APRESOLINE) 50 MG tablet Take 1 tablet (50 mg  total) by mouth 2 (two) times daily.  ? metoprolol succinate (TOPROL-XL) 50 MG 24 hr tablet TAKE 1 TABLET TWICE A DAY  WITH OR IMMEDIATELY        FOLLOWING MEALS  ? pantoprazole (PROTONIX) 40 MG tablet TAKE 1 TABLET DAILY  ? SYN

## 2022-01-08 NOTE — Assessment & Plan Note (Addendum)
Physical today 01/08/22.  Colonoscopy 02/2012 - diverticulosis.  Mammogram 02/18/21 - Birads I.  Scheduled for mammogram 02/22/22. ?

## 2022-01-17 ENCOUNTER — Encounter: Payer: Self-pay | Admitting: Internal Medicine

## 2022-01-17 NOTE — Assessment & Plan Note (Signed)
Continue on Lipitor.  Low-cholesterol diet and exercise.  Follow lipid panel liver function testing. 

## 2022-01-17 NOTE — Assessment & Plan Note (Signed)
On thyroid replacement.  Follow TSH 

## 2022-01-17 NOTE — Assessment & Plan Note (Signed)
Continue lipitor  ?

## 2022-01-17 NOTE — Assessment & Plan Note (Signed)
If takes protonix regularly - no symptoms.  Follow.  

## 2022-01-17 NOTE — Assessment & Plan Note (Signed)
Low-carb diet and exercise.  Follow met b and A1c. ?

## 2022-01-17 NOTE — Assessment & Plan Note (Signed)
Blood pressure as outlined. Continue amlodipine, metoprolol and hydralazine.  Continue to follow pressures.  No changes today.  Follow metabolic panel. 

## 2022-01-17 NOTE — Assessment & Plan Note (Addendum)
05/20/21: Right Carotid: Velocities in the right ICA are consistent with a 1-39% stenosis.                The ECA appears <50% stenosed.   Left Carotid: Velocities in the left ICA are consistent with a 40-59% stenosis.               The ECA appears <50% stenosed. Continue lipitor.   

## 2022-01-17 NOTE — Assessment & Plan Note (Signed)
Ctirucel.   ?

## 2022-01-17 NOTE — Assessment & Plan Note (Signed)
Unable to take ACE inhibitor secondary to reaction (swelling).  Continue to avoid anti-inflammatories.  Blood pressure as outlined.  Continue current medication regimen as outlined.  Follow metabolic panel.  Continue follow-up with nephrology. 

## 2022-01-28 ENCOUNTER — Other Ambulatory Visit (INDEPENDENT_AMBULATORY_CARE_PROVIDER_SITE_OTHER): Payer: Medicare Other

## 2022-01-28 DIAGNOSIS — E039 Hypothyroidism, unspecified: Secondary | ICD-10-CM

## 2022-01-28 DIAGNOSIS — R739 Hyperglycemia, unspecified: Secondary | ICD-10-CM | POA: Diagnosis not present

## 2022-01-28 DIAGNOSIS — E7800 Pure hypercholesterolemia, unspecified: Secondary | ICD-10-CM

## 2022-01-28 DIAGNOSIS — I10 Essential (primary) hypertension: Secondary | ICD-10-CM

## 2022-01-28 LAB — CBC WITH DIFFERENTIAL/PLATELET
Basophils Absolute: 0.1 10*3/uL (ref 0.0–0.1)
Basophils Relative: 1.3 % (ref 0.0–3.0)
Eosinophils Absolute: 0.3 10*3/uL (ref 0.0–0.7)
Eosinophils Relative: 4.5 % (ref 0.0–5.0)
HCT: 37.7 % (ref 36.0–46.0)
Hemoglobin: 12.8 g/dL (ref 12.0–15.0)
Lymphocytes Relative: 14.8 % (ref 12.0–46.0)
Lymphs Abs: 1.1 10*3/uL (ref 0.7–4.0)
MCHC: 34 g/dL (ref 30.0–36.0)
MCV: 91.9 fl (ref 78.0–100.0)
Monocytes Absolute: 0.5 10*3/uL (ref 0.1–1.0)
Monocytes Relative: 7.3 % (ref 3.0–12.0)
Neutro Abs: 5.2 10*3/uL (ref 1.4–7.7)
Neutrophils Relative %: 72.1 % (ref 43.0–77.0)
Platelets: 178 10*3/uL (ref 150.0–400.0)
RBC: 4.11 Mil/uL (ref 3.87–5.11)
RDW: 13.1 % (ref 11.5–15.5)
WBC: 7.3 10*3/uL (ref 4.0–10.5)

## 2022-01-28 LAB — BASIC METABOLIC PANEL
BUN: 20 mg/dL (ref 6–23)
CO2: 25 mEq/L (ref 19–32)
Calcium: 9.3 mg/dL (ref 8.4–10.5)
Chloride: 103 mEq/L (ref 96–112)
Creatinine, Ser: 1.38 mg/dL — ABNORMAL HIGH (ref 0.40–1.20)
GFR: 34.41 mL/min — ABNORMAL LOW (ref >60.00)
Glucose, Bld: 115 mg/dL — ABNORMAL HIGH (ref 70–99)
Potassium: 4.2 mEq/L (ref 3.5–5.1)
Sodium: 137 mEq/L (ref 135–145)

## 2022-01-28 LAB — HEPATIC FUNCTION PANEL
ALT: 10 U/L (ref 0–35)
AST: 16 U/L (ref 0–37)
Albumin: 4.2 g/dL (ref 3.5–5.2)
Alkaline Phosphatase: 103 U/L (ref 39–117)
Bilirubin, Direct: 0.1 mg/dL (ref 0.0–0.3)
Total Bilirubin: 0.6 mg/dL (ref 0.2–1.2)
Total Protein: 7.2 g/dL (ref 6.0–8.3)

## 2022-01-28 LAB — LIPID PANEL
Cholesterol: 147 mg/dL (ref 0–200)
HDL: 54.2 mg/dL (ref >39.00)
LDL Cholesterol: 62 mg/dL (ref 0–99)
NonHDL: 92.92
Total CHOL/HDL Ratio: 3
Triglycerides: 155 mg/dL — ABNORMAL HIGH (ref 0.0–149.0)
VLDL: 31 mg/dL (ref 0.0–40.0)

## 2022-01-28 LAB — TSH: TSH: 2.54 u[IU]/mL (ref 0.35–5.50)

## 2022-01-28 LAB — HEMOGLOBIN A1C: Hgb A1c MFr Bld: 5.6 % (ref 4.6–6.5)

## 2022-02-07 ENCOUNTER — Other Ambulatory Visit: Payer: Self-pay | Admitting: Internal Medicine

## 2022-02-22 ENCOUNTER — Ambulatory Visit
Admission: RE | Admit: 2022-02-22 | Discharge: 2022-02-22 | Disposition: A | Discharge status: Home / Self Care | Payer: Medicare Other | Source: Ambulatory Visit | Attending: Internal Medicine | Admitting: Internal Medicine

## 2022-02-22 DIAGNOSIS — Z1231 Encounter for screening mammogram for malignant neoplasm of breast: Secondary | ICD-10-CM | POA: Insufficient documentation

## 2022-03-07 ENCOUNTER — Other Ambulatory Visit: Payer: Self-pay | Admitting: Internal Medicine

## 2022-04-01 DIAGNOSIS — H02005 Unspecified entropion of left lower eyelid: Secondary | ICD-10-CM | POA: Diagnosis not present

## 2022-04-01 DIAGNOSIS — H353131 Nonexudative age-related macular degeneration, bilateral, early dry stage: Secondary | ICD-10-CM | POA: Diagnosis not present

## 2022-04-13 ENCOUNTER — Ambulatory Visit (INDEPENDENT_AMBULATORY_CARE_PROVIDER_SITE_OTHER): Payer: Medicare Other | Admitting: Internal Medicine

## 2022-04-13 ENCOUNTER — Encounter: Payer: Self-pay | Admitting: Internal Medicine

## 2022-04-13 VITALS — BP 130/60 | HR 63 | Temp 97.7°F | Resp 17 | Ht 62.0 in | Wt 157.4 lb

## 2022-04-13 DIAGNOSIS — R739 Hyperglycemia, unspecified: Secondary | ICD-10-CM

## 2022-04-13 DIAGNOSIS — I779 Disorder of arteries and arterioles, unspecified: Secondary | ICD-10-CM

## 2022-04-13 DIAGNOSIS — N183 Chronic kidney disease, stage 3 unspecified: Secondary | ICD-10-CM | POA: Diagnosis not present

## 2022-04-13 DIAGNOSIS — E7800 Pure hypercholesterolemia, unspecified: Secondary | ICD-10-CM

## 2022-04-13 DIAGNOSIS — K219 Gastro-esophageal reflux disease without esophagitis: Secondary | ICD-10-CM

## 2022-04-13 DIAGNOSIS — E039 Hypothyroidism, unspecified: Secondary | ICD-10-CM | POA: Diagnosis not present

## 2022-04-13 DIAGNOSIS — I10 Essential (primary) hypertension: Secondary | ICD-10-CM

## 2022-04-13 DIAGNOSIS — I700 Atherosclerosis of aorta: Secondary | ICD-10-CM

## 2022-04-13 NOTE — Progress Notes (Signed)
[name], female   DOB: 1931/11/12, 88 y.o.   MRN: [id number]   Subjective:    [name], female    DOB: 08/04/1932, 89 y.o.   MRN: [id number]   Patient here for a scheduled follow up.    HPI Here to follow up regarding her blood pressure and cholesterol.  She is doing relatively well.  No chest pain or sob reported.  No abdominal pain or bowel change reported.  Checking blood pressures:  averaging 130/60.  Discussed diet and exercise.  Discussed labs.     Past Medical History:  Diagnosis Date   Arthritis    lower back, hands   Benign esophageal stricture    Complication of anesthesia    Diverticulosis    Diverticulosis    Dysphagia    Family history of adverse reaction to anesthesia    brother had a hard time waking up with hernia surgery as a child.   GERD (gastroesophageal reflux disease)    Headache(784.0)    h/o recurrent vascular headaches. no longer gets these   Hypercholesterolemia    Hypertension    Hypothyroidism    IBS (irritable bowel syndrome)    Leg swelling    Neck fullness 2016   dr. Nicki Reaper reviewed and found it to be nothing.  prinivil seemed to cause face and neck and tongue swelling   PONV (postoperative nausea and vomiting)    Transient global amnesia    x1, over 25 yrs ago after migraine   Urinary tract bacterial infections    Vertigo    x1 - several yrs ago.  OK after Epley maneuver   Past Surgical History:  Procedure Laterality Date   ABDOMINAL HYSTERECTOMY     APPENDECTOMY     CATARACT EXTRACTION W/PHACO Right 10/12/2017   Procedure: CATARACT EXTRACTION PHACO AND INTRAOCULAR LENS PLACEMENT ([place]) RIGHT;  Surgeon: Leandrew Koyanagi, MD;  Location: [place];  Service: Ophthalmology;  Laterality: Right;   CATARACT EXTRACTION W/PHACO Left 11/09/2017   Procedure: CATARACT EXTRACTION PHACO AND INTRAOCULAR LENS PLACEMENT ([place]) LEFT toric;  Surgeon: Leandrew Koyanagi, MD;  Location: [place];   Service: Ophthalmology;  Laterality: Left;   CHOLECYSTECTOMY  1990   CYSTOSCOPY     x2   ESOPHAGOGASTRODUODENOSCOPY (EGD) WITH PROPOFOL N/A 08/28/2015   Procedure: ESOPHAGOGASTRODUODENOSCOPY (EGD) WITH PROPOFOL;  Surgeon: Hulen Luster, MD;  Location[facility] ENDOSCOPY;  Service: Gastroenterology;  Laterality: N/A;   ESOPHAGOGASTRODUODENOSCOPY (EGD) WITH PROPOFOL N/A 04/04/2017   Procedure: ESOPHAGOGASTRODUODENOSCOPY (EGD) WITH PROPOFOL;  Surgeon: Lollie Sails, MD;  Location: [facility] ENDOSCOPY;  Service: Endoscopy;  Laterality: N/A;   PARTIAL HYSTERECTOMY     PARTIAL KNEE ARTHROPLASTY Left 04/15/2016   Procedure: UNICOMPARTMENTAL KNEE;  Surgeon: Corky Mull, MD;  Location: [facility];  Service: Orthopedics;  Laterality: Left;   TONSILLECTOMY     Family History  Problem Relation Age of Onset   Heart disease Father    Heart disease Mother    Heart disease Brother    Heart disease Brother    Breast cancer Neg Hx    Colon cancer Neg Hx    Social History   Socioeconomic History   Marital status: Married    Spouse name: Not on file   Number of children: 2   Years of education: Not on file   Highest education level: Not on file  Occupational History   Occupation: retired  Tobacco Use   Smoking status: Never   Smokeless tobacco:  Never  Vaping Use   Vaping Use: Never used  Substance and Sexual Activity   Alcohol use: No    Alcohol/week: 0.0 standard drinks of alcohol    Comment: seldom, a glass of wine 1-3 per year.   Drug use: No   Sexual activity: Not Currently  Other Topics Concern   Not on file  Social History Narrative   Not on file   Social Determinants of Health   Financial Resource Strain: Low Risk  (07/29/2021)   Overall Financial Resource Strain (CARDIA)    Difficulty of Paying Living Expenses: Not hard at all  Food Insecurity: No Food Insecurity (07/29/2021)   Hunger Vital Sign    Worried About Running Out of Food in the Last Year: Never true    Ran Out of Food in the  Last Year: Never true  Transportation Needs: No Transportation Needs (07/29/2021)   PRAPARE - Hydrologist (Medical): No    Lack of Transportation (Non-Medical): No  Physical Activity: Not on file  Stress: No Stress Concern Present (07/29/2021)   [place]    Feeling of Stress : Not at all  Social Connections: Unknown (07/29/2021)   Social Connection and Isolation Panel [NHANES]    Frequency of Communication with Friends and Family: More than three times a week    Frequency of Social Gatherings with Friends and Family: More than three times a week    Attends Religious Services: More than 4 times per year    Active Member of [employer] or Organizations: Not on file    Attends Archivist Meetings: Not on file    Marital Status: Married     Review of Systems  Constitutional:  Negative for appetite change and unexpected weight change.  HENT:  Negative for congestion and sinus pressure.   Respiratory:  Negative for cough, chest tightness and shortness of breath.   Cardiovascular:  Negative for chest pain and palpitations.       No increased swelling.  Stable.   Gastrointestinal:  Negative for abdominal pain, diarrhea, nausea and vomiting.  Genitourinary:  Negative for difficulty urinating and dysuria.  Musculoskeletal:  Negative for joint swelling and myalgias.  Skin:  Negative for color change and rash.  Neurological:  Negative for dizziness, light-headedness and headaches.  Psychiatric/Behavioral:  Negative for agitation and dysphoric mood.        Objective:     BP 130/60 (BP Location: Left Arm, Patient Position: Sitting, Cuff Size: Small)   Pulse 63   Temp 97.7 F (36.5 C) (Temporal)   Resp 17   Ht $R'5\' 2"'nj$  (1.575 m)   Wt 157 lb 6.4 oz (71.4 kg)   SpO2 97%   BMI 28.79 kg/m  Wt Readings from Last 3 Encounters:  04/13/22 157 lb 6.4 oz (71.4 kg)  01/08/22 158 lb (71.7 kg)   09/09/21 157 lb (71.2 kg)    Physical Exam Vitals reviewed.  Constitutional:      General: She is not in acute distress.    Appearance: Normal appearance.  HENT:     Head: Normocephalic and atraumatic.     Right Ear: External ear normal.     Left Ear: External ear normal.  Eyes:     General: No scleral icterus.       Right eye: No discharge.        Left eye: No discharge.     Conjunctiva/sclera: Conjunctivae normal.  Neck:  Thyroid: No thyromegaly.  Cardiovascular:     Rate and Rhythm: Normal rate and regular rhythm.  Pulmonary:     Effort: No respiratory distress.     Breath sounds: Normal breath sounds. No wheezing.  Abdominal:     General: Bowel sounds are normal.     Palpations: Abdomen is soft.     Tenderness: There is no abdominal tenderness.  Musculoskeletal:        General: No swelling or tenderness.     Cervical back: Neck supple. No tenderness.  Lymphadenopathy:     Cervical: No cervical adenopathy.  Skin:    Findings: No erythema or rash.  Neurological:     Mental Status: She is alert.  Psychiatric:        Mood and Affect: Mood normal.        Behavior: Behavior normal.      Outpatient Encounter Medications as of 04/13/2022  Medication Sig   amitriptyline (ELAVIL) 25 MG tablet TAKE 1 TABLET AT BEDTIME   amLODipine (NORVASC) 10 MG tablet TAKE 1 TABLET DAILY   aspirin 81 MG tablet Take 81 mg by mouth daily.   atorvastatin (LIPITOR) 40 MG tablet TAKE 1 TABLET DAILY   Calcium Carbonate-Vitamin D 600-400 MG-UNIT tablet Take 1 tablet by mouth daily.   Cholecalciferol (VITAMIN D3) 1000 units CAPS Take by mouth.   metoprolol succinate (TOPROL-XL) 50 MG 24 hr tablet TAKE 1 TABLET TWICE A DAY  WITH OR IMMEDIATELY        FOLLOWING MEALS   pantoprazole (PROTONIX) 40 MG tablet TAKE 1 TABLET DAILY   SYNTHROID 75 MCG tablet TAKE 1 TABLET DAILY BEFORE BREAKFAST   [DISCONTINUED] hydrALAZINE (APRESOLINE) 50 MG tablet Take 1 tablet (50 mg total) by mouth 2 (two)  times daily.   No facility-administered encounter medications on file as of 04/13/2022.     Lab Results  Component Value Date   WBC 7.3 01/28/2022   HGB 12.8 01/28/2022   HCT 37.7 01/28/2022   PLT 178.0 01/28/2022   GLUCOSE 115 (H) 01/28/2022   CHOL 147 01/28/2022   TRIG 155.0 (H) 01/28/2022   HDL 54.20 01/28/2022   LDLDIRECT 64.9 12/08/2012   LDLCALC 62 01/28/2022   ALT 10 01/28/2022   AST 16 01/28/2022   NA 137 01/28/2022   K 4.2 01/28/2022   CL 103 01/28/2022   CREATININE 1.38 (H) 01/28/2022   BUN 20 01/28/2022   CO2 25 01/28/2022   TSH 2.54 01/28/2022   INR 1.03 04/07/2016   HGBA1C 5.6 01/28/2022    MM 3D SCREEN BREAST BILATERAL  Result Date: 02/23/2022 CLINICAL DATA:  Screening. EXAM: DIGITAL SCREENING BILATERAL MAMMOGRAM WITH TOMOSYNTHESIS AND CAD TECHNIQUE: Bilateral screening digital craniocaudal and mediolateral oblique mammograms were obtained. Bilateral screening digital breast tomosynthesis was performed. The images were evaluated with computer-aided detection. COMPARISON:  Previous exam(s). ACR Breast Density Category b: There are scattered areas of fibroglandular density. FINDINGS: There are no findings suspicious for malignancy. IMPRESSION: No mammographic evidence of malignancy. A result letter of this screening mammogram will be mailed directly to the patient. RECOMMENDATION: Screening mammogram in one year. (Code:SM-B-01Y) BI-RADS CATEGORY  1: Negative. Electronically Signed   By: Zerita Boers M.D.   On: 02/23/2022 13:12       Assessment & Plan:   Problem List Items Addressed This Visit     Aortic atherosclerosis ([place])    Continue lipitor.        Carotid artery disease ([place])    05/20/21: Right Carotid: Velocities  in the right ICA are consistent with a 1-39% stenosis.                The ECA appears <50% stenosed.   Left Carotid: Velocities in the left ICA are consistent with a 40-59% stenosis.               The ECA appears <50% stenosed. Continue  lipitor.        CKD (chronic kidney disease), stage III ([place])    Unable to take ACE inhibitor secondary to reaction (swelling).  Continue to avoid anti-inflammatories.  Blood pressure as outlined.  Continue current medication regimen as outlined.  Follow metabolic panel.  Continue follow-up with nephrology.      GERD (gastroesophageal reflux disease)    If takes protonix regularly - no symptoms.  Follow.       Hypercholesterolemia    Continue on Lipitor.  Low-cholesterol diet and exercise.  Follow lipid panel liver function testing.      Relevant Orders   Lipid Profile   Hepatic function panel   Hyperglycemia    Low-carb diet and exercise.  Follow met b and A1c.      Relevant Orders   HgB A1c   Hypertension - Primary    Blood pressure as outlined. Continue amlodipine, metoprolol and hydralazine.  Continue to follow pressures.  No changes today.  Follow metabolic panel.      Relevant Orders   Basic Metabolic Panel (BMET)   Hypothyroidism    On thyroid replacement.  Follow TSH.        Einar Pheasant, MD

## 2022-04-15 ENCOUNTER — Other Ambulatory Visit: Payer: Self-pay

## 2022-04-15 ENCOUNTER — Telehealth: Payer: Self-pay

## 2022-04-15 MED ORDER — HYDRALAZINE HCL 50 MG PO TABS: 50.0000 mg | ORAL_TABLET | Freq: Two times a day (BID) | ORAL | 3 refills | Status: DC

## 2022-04-15 NOTE — Telephone Encounter (Signed)
Patient states we sent a prescription refill in for her to [facility] for a 30-day supply of hydrALAZINE (APRESOLINE) 50 MG tablet.  Patient states she would like to have a 90-day supply instead of a 30-day supply, and asked if we would re-send the prescription with this change.

## 2022-04-15 NOTE — Telephone Encounter (Signed)
sent 

## 2022-04-19 ENCOUNTER — Encounter: Payer: Self-pay | Admitting: Internal Medicine

## 2022-04-19 NOTE — Assessment & Plan Note (Signed)
If takes protonix regularly - no symptoms.  Follow.  

## 2022-04-19 NOTE — Assessment & Plan Note (Signed)
Low-carb diet and exercise.  Follow met b and A1c. ?

## 2022-04-19 NOTE — Assessment & Plan Note (Signed)
Blood pressure as outlined. Continue amlodipine, metoprolol and hydralazine.  Continue to follow pressures.  No changes today.  Follow metabolic panel. 

## 2022-04-19 NOTE — Assessment & Plan Note (Signed)
Continue lipitor  ?

## 2022-04-19 NOTE — Assessment & Plan Note (Signed)
Unable to take ACE inhibitor secondary to reaction (swelling).  Continue to avoid anti-inflammatories.  Blood pressure as outlined.  Continue current medication regimen as outlined.  Follow metabolic panel.  Continue follow-up with nephrology. 

## 2022-04-19 NOTE — Assessment & Plan Note (Signed)
On thyroid replacement.  Follow TSH 

## 2022-04-19 NOTE — Assessment & Plan Note (Signed)
05/20/21: Right Carotid: Velocities in the right ICA are consistent with a 1-39% stenosis.                The ECA appears <50% stenosed.   Left Carotid: Velocities in the left ICA are consistent with a 40-59% stenosis.               The ECA appears <50% stenosed. Continue lipitor.   

## 2022-04-19 NOTE — Assessment & Plan Note (Signed)
Continue on Lipitor.  Low-cholesterol diet and exercise.  Follow lipid panel liver function testing. 

## 2022-04-27 ENCOUNTER — Telehealth: Payer: Self-pay | Admitting: Internal Medicine

## 2022-04-27 NOTE — Telephone Encounter (Signed)
Pt called in requesting for refill on medication (hydrALAZINE (APRESOLINE) 50 MG tablet)... Pt requesting callback

## 2022-04-28 ENCOUNTER — Other Ambulatory Visit: Payer: Self-pay

## 2022-04-28 MED ORDER — HYDRALAZINE HCL 50 MG PO TABS: 50.0000 mg | ORAL_TABLET | Freq: Two times a day (BID) | ORAL | 3 refills | Status: DC

## 2022-04-28 NOTE — Telephone Encounter (Signed)
Sent - pt advised 

## 2022-05-08 ENCOUNTER — Other Ambulatory Visit: Payer: Self-pay | Admitting: Internal Medicine

## 2022-05-30 ENCOUNTER — Other Ambulatory Visit: Payer: Self-pay | Admitting: Internal Medicine

## 2022-05-31 DIAGNOSIS — I10 Essential (primary) hypertension: Secondary | ICD-10-CM | POA: Diagnosis not present

## 2022-05-31 DIAGNOSIS — R600 Localized edema: Secondary | ICD-10-CM | POA: Diagnosis not present

## 2022-05-31 DIAGNOSIS — N1832 Chronic kidney disease, stage 3b: Secondary | ICD-10-CM | POA: Diagnosis not present

## 2022-06-07 DIAGNOSIS — I10 Essential (primary) hypertension: Secondary | ICD-10-CM | POA: Diagnosis not present

## 2022-06-07 DIAGNOSIS — I129 Hypertensive chronic kidney disease with stage 1 through stage 4 chronic kidney disease, or unspecified chronic kidney disease: Secondary | ICD-10-CM | POA: Diagnosis not present

## 2022-06-07 DIAGNOSIS — R600 Localized edema: Secondary | ICD-10-CM | POA: Diagnosis not present

## 2022-06-07 DIAGNOSIS — N1832 Chronic kidney disease, stage 3b: Secondary | ICD-10-CM | POA: Diagnosis not present

## 2022-07-20 DIAGNOSIS — Z23 Encounter for immunization: Secondary | ICD-10-CM | POA: Diagnosis not present

## 2022-07-30 ENCOUNTER — Ambulatory Visit (INDEPENDENT_AMBULATORY_CARE_PROVIDER_SITE_OTHER): Payer: Medicare Other

## 2022-07-30 VITALS — Ht 62.0 in | Wt 157.0 lb

## 2022-07-30 DIAGNOSIS — Z0000 Encounter for general adult medical examination without abnormal findings: Secondary | ICD-10-CM

## 2022-07-30 NOTE — Patient Instructions (Addendum)
Elizabeth Henderson , Thank you for taking time to come for your Medicare Wellness Visit. I appreciate your ongoing commitment to your health goals. Please review the following plan we discussed and let me know if I can assist you in the future.   These are the goals we discussed:  Goals      Maintain Healthy Lifestyle     Stay hydrated! Drink plenty of fluids/water. Stay active and continue home exercises.  Low carb foods.         This is a list of the screening recommended for you and due dates:  Health Maintenance  Topic Date Due   COVID-19 Vaccine (5 - Moderna series) 08/15/2022*   Tetanus Vaccine  01/09/2023*   Mammogram  02/23/2023   Medicare Annual Wellness Visit  07/31/2023   Pneumonia Vaccine  Completed   Flu Shot  Completed   DEXA scan (bone density measurement)  Completed   Zoster (Shingles) Vaccine  Completed   HPV Vaccine  Aged Out  *Topic was postponed. The date shown is not the original due date.    Advanced directives: End of life planning; Advance aging; Advanced directives discussed.  Copy of current HCPOA/Living Will requested.    Conditions/risks identified: none new  Next appointment: Follow up in one year for your annual wellness visit    Preventive Care 65 Years and Older, Female Preventive care refers to lifestyle choices and visits with your health care provider that can promote health and wellness. What does preventive care include? A yearly physical exam. This is also called an annual well check. Dental exams once or twice a year. Routine eye exams. Ask your health care provider how often you should have your eyes checked. Personal lifestyle choices, including: Daily care of your teeth and gums. Regular physical activity. Eating a healthy diet. Avoiding tobacco and drug use. Limiting alcohol use. Practicing safe sex. Taking low-dose aspirin every day. Taking vitamin and mineral supplements as recommended by your health care provider. What happens  during an annual well check? The services and screenings done by your health care provider during your annual well check will depend on your age, overall health, lifestyle risk factors, and family history of disease. Counseling  Your health care provider may ask you questions about your: Alcohol use. Tobacco use. Drug use. Emotional well-being. Home and relationship well-being. Sexual activity. Eating habits. History of falls. Memory and ability to understand (cognition). Work and work Statistician. Reproductive health. Screening  You may have the following tests or measurements: Height, weight, and BMI. Blood pressure. Lipid and cholesterol levels. These may be checked every 5 years, or more frequently if you are over 46 years old. Skin check. Lung cancer screening. You may have this screening every year starting at age 19 if you have a 30-pack-year history of smoking and currently smoke or have quit within the past 15 years. Fecal occult blood test (FOBT) of the stool. You may have this test every year starting at age 46. Flexible sigmoidoscopy or colonoscopy. You may have a sigmoidoscopy every 5 years or a colonoscopy every 10 years starting at age 44. Hepatitis C blood test. Hepatitis B blood test. Sexually transmitted disease (STD) testing. Diabetes screening. This is done by checking your blood sugar (glucose) after you have not eaten for a while (fasting). You may have this done every 1-3 years. Bone density scan. This is done to screen for osteoporosis. You may have this done starting at age 19. Mammogram. This may be done  every 1-2 years. Talk to your health care provider about how often you should have regular mammograms. Talk with your health care provider about your test results, treatment options, and if necessary, the need for more tests. Vaccines  Your health care provider may recommend certain vaccines, such as: Influenza vaccine. This is recommended every  year. Tetanus, diphtheria, and acellular pertussis (Tdap, Td) vaccine. You may need a Td booster every 10 years. Zoster vaccine. You may need this after age 54. Pneumococcal 13-valent conjugate (PCV13) vaccine. One dose is recommended after age 38. Pneumococcal polysaccharide (PPSV23) vaccine. One dose is recommended after age 51. Talk to your health care provider about which screenings and vaccines you need and how often you need them. This information is not intended to replace advice given to you by your health care provider. Make sure you discuss any questions you have with your health care provider. Document Released: 10/03/2015 Document Revised: 05/26/2016 Document Reviewed: 07/08/2015 Elsevier Interactive Patient Education  2017 [place] Prevention in the Home Falls can cause injuries. They can happen to people of all ages. There are many things you can do to make your home safe and to help prevent falls. What can I do on the outside of my home? Regularly fix the edges of walkways and driveways and fix any cracks. Remove anything that might make you trip as you walk through a door, such as a raised step or threshold. Trim any bushes or trees on the path to your home. Use bright outdoor lighting. Clear any walking paths of anything that might make someone trip, such as rocks or tools. Regularly check to see if handrails are loose or broken. Make sure that both sides of any steps have handrails. Any raised decks and porches should have guardrails on the edges. Have any leaves, snow, or ice cleared regularly. Use sand or salt on walking paths during winter. Clean up any spills in your garage right away. This includes oil or grease spills. What can I do in the bathroom? Use night lights. Install grab bars by the toilet and in the tub and shower. Do not use towel bars as grab bars. Use non-skid mats or decals in the tub or shower. If you need to sit down in the shower, use a  plastic, non-slip stool. Keep the floor dry. Clean up any water that spills on the floor as soon as it happens. Remove soap buildup in the tub or shower regularly. Attach bath mats securely with double-sided non-slip rug tape. Do not have throw rugs and other things on the floor that can make you trip. What can I do in the bedroom? Use night lights. Make sure that you have a light by your bed that is easy to reach. Do not use any sheets or blankets that are too big for your bed. They should not hang down onto the floor. Have a firm chair that has side arms. You can use this for support while you get dressed. Do not have throw rugs and other things on the floor that can make you trip. What can I do in the kitchen? Clean up any spills right away. Avoid walking on wet floors. Keep items that you use a lot in easy-to-reach places. If you need to reach something above you, use a strong step stool that has a grab bar. Keep electrical cords out of the way. Do not use floor polish or wax that makes floors slippery. If you must use wax, use  non-skid floor wax. Do not have throw rugs and other things on the floor that can make you trip. What can I do with my stairs? Do not leave any items on the stairs. Make sure that there are handrails on both sides of the stairs and use them. Fix handrails that are broken or loose. Make sure that handrails are as long as the stairways. Check any carpeting to make sure that it is firmly attached to the stairs. Fix any carpet that is loose or worn. Avoid having throw rugs at the top or bottom of the stairs. If you do have throw rugs, attach them to the floor with carpet tape. Make sure that you have a light switch at the top of the stairs and the bottom of the stairs. If you do not have them, ask someone to add them for you. What else can I do to help prevent falls? Wear shoes that: Do not have high heels. Have rubber bottoms. Are comfortable and fit you  well. Are closed at the toe. Do not wear sandals. If you use a stepladder: Make sure that it is fully opened. Do not climb a closed stepladder. Make sure that both sides of the stepladder are locked into place. Ask someone to hold it for you, if possible. Clearly mark and make sure that you can see: Any grab bars or handrails. First and last steps. Where the edge of each step is. Use tools that help you move around (mobility aids) if they are needed. These include: Canes. Walkers. Scooters. Crutches. Turn on the lights when you go into a dark area. Replace any light bulbs as soon as they burn out. Set up your furniture so you have a clear path. Avoid moving your furniture around. If any of your floors are uneven, fix them. If there are any pets around you, be aware of where they are. Review your medicines with your doctor. Some medicines can make you feel dizzy. This can increase your chance of falling. Ask your doctor what other things that you can do to help prevent falls. This information is not intended to replace advice given to you by your health care provider. Make sure you discuss any questions you have with your health care provider. Document Released: 07/03/2009 Document Revised: 02/12/2016 Document Reviewed: 10/11/2014 Elsevier Interactive Patient Education  2017 [employer].

## 2022-07-30 NOTE — Progress Notes (Signed)
Subjective:   Elizabeth Henderson is a 88 y.o. female who presents for Medicare Annual (Subsequent) preventive examination.  Review of Systems    No ROS.  Medicare Wellness Virtual Visit.  Visual/audio telehealth visit, UTA vital signs.   See social history for additional risk factors.   Cardiac Risk Factors include: advanced age (>42mn, >>32women)     Objective:    Today's Vitals   07/30/22 1541  Weight: 157 lb (71.2 kg)  Height: '5\' 2"'$  (1.575 m)   Body mass index is 28.72 kg/m.     07/30/2022    3:49 PM 07/29/2021    2:57 PM 05/22/2020    1:45 PM 11/09/2017    9:20 AM 11/04/2017   11:24 AM 10/12/2017   10:21 AM 04/04/2017   12:08 PM  Advanced Directives  Does Patient Have a Medical Advance Directive? Yes No No No No No No  Type of [place]        Does patient want to make changes to medical advance directive? No - Patient declined        Copy of H[place]ain Chart? No - copy requested        Would patient like information on creating a medical advance directive?  No - Patient declined No - Patient declined No - Patient declined No - Patient declined No - Patient declined     Current Medications (verified) Outpatient Encounter Medications as of 07/30/2022  Medication Sig   amitriptyline (ELAVIL) 25 MG tablet TAKE 1 TABLET AT BEDTIME   amLODipine (NORVASC) 10 MG tablet TAKE 1 TABLET DAILY   aspirin 81 MG tablet Take 81 mg by mouth daily.   atorvastatin (LIPITOR) 40 MG tablet TAKE 1 TABLET DAILY   Calcium Carbonate-Vitamin D 600-400 MG-UNIT tablet Take 1 tablet by mouth daily.   Cholecalciferol (VITAMIN D3) 1000 units CAPS Take by mouth.   hydrALAZINE (APRESOLINE) 50 MG tablet Take 1 tablet (50 mg total) by mouth 2 (two) times daily.   metoprolol succinate (TOPROL-XL) 50 MG 24 hr tablet TAKE 1 TABLET TWICE A DAY  WITH OR IMMEDIATELY        FOLLOWING MEALS   pantoprazole (PROTONIX) 40 MG tablet TAKE 1 TABLET  DAILY   SYNTHROID 75 MCG tablet TAKE 1 TABLET DAILY BEFORE BREAKFAST   No facility-administered encounter medications on file as of 07/30/2022.    Allergies (verified) Ace inhibitors, Prinivil [lisinopril], Rofecoxib, Flaxseed (linseed), Mercury, Ofloxacin, and Sulfa antibiotics   History: Past Medical History:  Diagnosis Date   Arthritis    lower back, hands   Benign esophageal stricture    Complication of anesthesia    Diverticulosis    Diverticulosis    Dysphagia    Family history of adverse reaction to anesthesia    brother had a hard time waking up with hernia surgery as a child.   GERD (gastroesophageal reflux disease)    Headache(784.0)    h/o recurrent vascular headaches. no longer gets these   Hypercholesterolemia    Hypertension    Hypothyroidism    IBS (irritable bowel syndrome)    Leg swelling    Neck fullness 2016   dr. sNicki Reaperreviewed and found it to be nothing.  prinivil seemed to cause face and neck and tongue swelling   PONV (postoperative nausea and vomiting)    Transient global amnesia    x1, over 25 yrs ago after migraine   Urinary tract bacterial infections  Vertigo    x1 - several yrs ago.  OK after Epley maneuver   Past Surgical History:  Procedure Laterality Date   ABDOMINAL HYSTERECTOMY     APPENDECTOMY     CATARACT EXTRACTION W/PHACO Right 10/12/2017   Procedure: CATARACT EXTRACTION PHACO AND INTRAOCULAR LENS PLACEMENT ([place]) RIGHT;  Surgeon: Leandrew Koyanagi, MD;  Location: [facility];  Service: Ophthalmology;  Laterality: Right;   CATARACT EXTRACTION W/PHACO Left 11/09/2017   Procedure: CATARACT EXTRACTION PHACO AND INTRAOCULAR LENS PLACEMENT ([place]) LEFT toric;  Surgeon: Leandrew Koyanagi, MD;  Location: [facility];  Service: Ophthalmology;  Laterality: Left;   CHOLECYSTECTOMY  1990   CYSTOSCOPY     x2   ESOPHAGOGASTRODUODENOSCOPY (EGD) WITH PROPOFOL N/A 08/28/2015   Procedure: ESOPHAGOGASTRODUODENOSCOPY (EGD) WITH  PROPOFOL;  Surgeon: Hulen Luster, MD;  [facility] END[facility]COPY;  Service: Gastroenterology;  Laterality: N/A;   ESOPHAGOGASTRODUODENOSCOPY (EGD) WITH PROPOFOL N/A 04/04/2017   Procedure: ESOPHAGOGASTRODUODENOSCOPY (EGD) WITH PROPOFOL;  Surgeon: Lollie Sails, MD;  Location: [facility] ENDOSCOPY;  Service: Endoscopy;  Laterality: N/A;   PARTIAL HYSTERECTOMY     PARTIAL KNEE ARTHROPLASTY Left 04/15/2016   Procedure: UNICOMPARTMENTAL KNEE;  Surgeon: Corky Mull, MD;  Location: [facility] ORS;  Service: Orthopedics;  Laterality: Left;   TONSILLECTOMY     Family History  Problem Relation Age of Onset   Heart disease Father    Heart disease Mother    Heart disease Brother    Heart disease Brother    Breast cancer Neg Hx    Colon cancer Neg Hx    Social History   Socioeconomic History   Marital status: Married    Spouse name: Not on file   Number of children: 2   Years of education: Not on file   Highest education level: Not on file  Occupational History   Occupation: retired  Tobacco Use   Smoking status: Never   Smokeless tobacco: Never  Vaping Use   Vaping Use: Never used  Substance and Sexual Activity   Alcohol use: No    Alcohol/week: 0.0 standard drinks of alcohol    Comment: seldom, a glass of wine 1-3 per year.   Drug use: No   Sexual activity: Not Currently  Other Topics Concern   Not on file  Social History Narrative   Not on file   Social Determinants of Health   Financial Resource Strain: Low Risk  (07/30/2022)   Overall Financial Resource Strain (CARDIA)    Difficulty of Paying Living Expenses: Not hard at all  Food Insecurity: No Food Insecurity (07/30/2022)   Hunger Vital Sign    Worried About Running Out of Food in the Last Year: Never true    Ran Out of Food in the Last Year: Never true  Transportation Needs: No Transportation Needs (07/30/2022)   PRAPARE - Hydrologist (Medical): No    Lack of Transportation (Non-Medical): No   Physical Activity: Not on file  Stress: No Stress Concern Present (07/30/2022)   [place]    Feeling of Stress : Not at all  Social Connections: Unknown (07/30/2022)   Social Connection and Isolation Panel [NHANES]    Frequency of Communication with Friends and Family: More than three times a week    Frequency of Social Gatherings with Friends and Family: More than three times a week    Attends Religious Services: More than 4 times per year    Active Member  of Clubs or Organizations: Not on file    Attends Archivist Meetings: Not on file    Marital Status: Married    Tobacco Counseling Counseling given: Not Answered   Clinical Intake:  Pre-visit preparation completed: Yes        Diabetes: No  How often do you need to have someone help you when you read instructions, pamphlets, or other written materials from your doctor or pharmacy?: 1 - Never    Interpreter Needed?: No      Activities of Daily Living    07/30/2022    3:50 PM  In your present state of health, do you have any difficulty performing the following activities:  Hearing? 0  Vision? 0  Difficulty concentrating or making decisions? 0  Walking or climbing stairs? 0  Dressing or bathing? 0  Doing errands, shopping? 0  Comment Does not drive after dark  Preparing Food and eating ? N  Using the Toilet? N  In the past six months, have you accidently leaked urine? N  Do you have problems with loss of bowel control? N  Managing your Medications? N  Managing your Finances? N  Housekeeping or managing your Housekeeping? N    Patient Care Team: Einar Pheasant, MD as PCP - General (Internal Medicine)  Indicate any recent Medical Services you may have received from other than [facility] providers in the past year (date may be approximate).     Assessment:   This is a routine wellness examination for [place].  I connected with   Elizabeth Henderson on 07/30/22 by a audio enabled telemedicine application and verified that I am speaking with the correct person using two identifiers.  Patient Location: Home  Provider Location: Office/Clinic  I discussed the limitations of evaluation and management by telemedicine. The patient expressed understanding and agreed to proceed.   Hearing/Vision screen Hearing Screening - Comments:: Patient is able to hear conversational tones without difficulty. No issues reported. Vision Screening - Comments:: Follow[facility], Dr. Wallace Going Wears corrective lenses  Annual visits  Cataract extraction, bilateral  They have regular follow up with the ophthalmologist    Dietary issues and exercise activities discussed: Current Exercise Habits: Home exercise routine, Intensity: Mild Healthy diet   Goals Addressed             This Visit's Progress    Maintain Healthy Lifestyle       Stay hydrated! Drink plenty of fluids/water. Stay active and continue home exercises.  Low carb foods.        Depression Screen    07/30/2022    3:50 PM 04/13/2022    1:06 PM 01/08/2022   10:11 AM 07/29/2021    2:55 PM 08/27/2020   12:18 PM 05/22/2020    1:42 PM 07/20/2019    1:37 PM  PHQ 2/9 Scores  PHQ - 2 Score 0 0 0 0 0 0 0    Fall Risk    07/30/2022    3:59 PM 04/13/2022    1:06 PM 01/08/2022   10:11 AM 07/29/2021    2:58 PM 08/27/2020   12:18 PM  [place] in the past year? 0 0 0 0 0  Number falls in past yr: 0 0  0 0  Injury with Fall? 0 0  0 0  Risk for fall due to :  No Fall Risks No Fall Risks    Follow up Falls evaluation completed;Falls prevention discussed Follow up appointment Falls  evaluation completed Falls evaluation completed Falls evaluation completed   FALL RISK PREVENTION PERTAINING TO THE HOME: Home free of loose throw rugs in walkways, pet beds, electrical cords, etc? Yes  Adequate lighting in your home to reduce risk of falls? Yes    ASSISTIVE DEVICES UTILIZED TO PREVENT FALLS: Life alert? No  Use of a cane, walker or w/c? No  Grab bars in the bathroom? Yes Shower chair or bench in shower? Yes  Comfort chair height/Elevated toilet seat? Yes   TIMED UP AND GO: Was the test performed? No .   Cognitive Function:    11/04/2017   11:48 AM 11/01/2016    4:20 PM 10/27/2015   10:37 AM  MMSE - Mini Mental State Exam  Orientation to time '5 5 5  '$ Orientation to Place '5 5 5  '$ Registration '3 3 3  '$ Attention/ Calculation '5 5 5  '$ Recall '2 3 3  '$ Language- name 2 objects '2 2 2  '$ Language- repeat '1 1 1  '$ Language- follow 3 step command '3 3 3  '$ Language- read & follow direction '1 1 1  '$ Write a sentence '1 1 1  '$ Copy design '1 1 1  '$ Total score '29 30 30        '$ 07/30/2022    3:59 PM 07/29/2021    3:18 PM 05/22/2020    1:47 PM  6CIT Screen  What Year? 0 points 0 points 0 points  What month? 0 points 0 points 0 points  What time? 0 points 0 points 0 points  Count back from 20 0 points 0 points   Months in reverse 0 points 0 points   Repeat phrase 0 points 0 points   Total Score 0 points 0 points     Immunizations Immunization History  Administered Date(s) Administered   Fluad Quad(high Dose 65+) 08/01/2020, 07/25/2022   Influenza Split 07/17/2013   Influenza, High Dose Seasonal PF 07/09/2016, 05/31/2017, 06/26/2018, 06/04/2019, 06/30/2021   Influenza,inj,Quad PF,6+ Mos 06/20/2014, 07/02/2015   Influenza-Unspecified 07/23/2012, 06/12/2013, 06/20/2014, 07/02/2015, 07/09/2016   Moderna Covid-19 Vaccine Bivalent Booster 58yr & up 07/24/2021   Moderna Sars-Covid-2 Vaccination 10/17/2019, 11/14/2019, 07/25/2020   Pneumococcal Conjugate-13 12/17/2013   Pneumococcal Polysaccharide-23 01/28/2017   Zoster Recombinat (Shingrix) 04/07/2020, 06/13/2020   Zoster, Live 12/23/2011   Screening Tests Health Maintenance  Topic Date Due   COVID-19 Vaccine (5 - Moderna series) 08/15/2022 (Originally 11/21/2021)   TETANUS/TDAP  01/09/2023  (Originally 07/24/1951)   MAMMOGRAM  02/23/2023   Medicare Annual Wellness (AWV)  07/31/2023   Pneumonia Vaccine 88 Years old  Completed   INFLUENZA VACCINE  Completed   DEXA SCAN  Completed   Zoster Vaccines- Shingrix  Completed   HPV VACCINES  Aged Out   Health Maintenance There are no preventive care reminders to display for this patient.  Lung Cancer Screening: (Low Dose CT Chest recommended if Age 86-80years, 30 pack-year currently smoking OR have quit w/in 15years.) does not qualify.   Hepatitis C Screening: does not qualify.  Vision Screening: Recommended annual ophthalmology exams for early detection of glaucoma and other disorders of the eye.  Dental Screening: Recommended annual dental exams for proper oral hygiene.  Community Resource Referral / Chronic Care Management: CRR required this visit?  No   CCM required this visit?  No      Plan:     I have personally reviewed and noted the following in the patient's chart:   Medical and social history Use of alcohol, tobacco or illicit drugs  Current medications and supplements including opioid prescriptions. Patient is not currently taking opioid prescriptions. Functional ability and status Nutritional status Physical activity Advanced directives List of other physicians Hospitalizations, surgeries, and ER visits in previous 12 months Vitals Screenings to include cognitive, depression, and falls Referrals and appointments  In addition, I have reviewed and discussed with patient certain preventive protocols, quality metrics, and best practice recommendations. A written personalized care plan for preventive services as well as general preventive health recommendations were provided to patient.     Leta Jungling, LPN   [id number]

## 2022-08-03 DIAGNOSIS — Z23 Encounter for immunization: Secondary | ICD-10-CM | POA: Diagnosis not present

## 2022-08-08 ENCOUNTER — Other Ambulatory Visit: Payer: Self-pay | Admitting: Internal Medicine

## 2022-08-17 ENCOUNTER — Other Ambulatory Visit: Payer: 59

## 2022-08-18 ENCOUNTER — Other Ambulatory Visit (INDEPENDENT_AMBULATORY_CARE_PROVIDER_SITE_OTHER): Payer: Medicare Other

## 2022-08-18 ENCOUNTER — Other Ambulatory Visit: Payer: Self-pay

## 2022-08-18 ENCOUNTER — Ambulatory Visit
Admission: EM | Admit: 2022-08-18 | Discharge: 2022-08-18 | Disposition: A | Discharge status: Home / Self Care | Payer: Medicare Other | Attending: Urgent Care | Admitting: Urgent Care

## 2022-08-18 DIAGNOSIS — R739 Hyperglycemia, unspecified: Secondary | ICD-10-CM | POA: Diagnosis not present

## 2022-08-18 DIAGNOSIS — I10 Essential (primary) hypertension: Secondary | ICD-10-CM | POA: Diagnosis not present

## 2022-08-18 DIAGNOSIS — E7800 Pure hypercholesterolemia, unspecified: Secondary | ICD-10-CM | POA: Diagnosis not present

## 2022-08-18 DIAGNOSIS — N3001 Acute cystitis with hematuria: Secondary | ICD-10-CM

## 2022-08-18 LAB — HEPATIC FUNCTION PANEL
ALT: 8 U/L (ref 0–35)
AST: 13 U/L (ref 0–37)
Albumin: 4 g/dL (ref 3.5–5.2)
Alkaline Phosphatase: 93 U/L (ref 39–117)
Bilirubin, Direct: 0.2 mg/dL (ref 0.0–0.3)
Total Bilirubin: 0.7 mg/dL (ref 0.2–1.2)
Total Protein: 6.6 g/dL (ref 6.0–8.3)

## 2022-08-18 LAB — POCT URINALYSIS DIP (MANUAL ENTRY)

## 2022-08-18 LAB — BASIC METABOLIC PANEL
BUN: 22 mg/dL (ref 6–23)
CO2: 27 mEq/L (ref 19–32)
Calcium: 9 mg/dL (ref 8.4–10.5)
Chloride: 103 mEq/L (ref 96–112)
Creatinine, Ser: 1.4 mg/dL — ABNORMAL HIGH (ref 0.40–1.20)
GFR: 33.69 mL/min — ABNORMAL LOW (ref >60.00)
Glucose, Bld: 111 mg/dL — ABNORMAL HIGH (ref 70–99)
Potassium: 4 mEq/L (ref 3.5–5.1)
Sodium: 139 mEq/L (ref 135–145)

## 2022-08-18 LAB — LIPID PANEL
Cholesterol: 142 mg/dL (ref 0–200)
HDL: 47 mg/dL (ref >39.00)
LDL Cholesterol: 71 mg/dL (ref 0–99)
NonHDL: 95.23
Total CHOL/HDL Ratio: 3
Triglycerides: 122 mg/dL (ref 0.0–149.0)
VLDL: 24.4 mg/dL (ref 0.0–40.0)

## 2022-08-18 LAB — HEMOGLOBIN A1C: Hgb A1c MFr Bld: 5.8 % (ref 4.6–6.5)

## 2022-08-18 MED ORDER — CEPHALEXIN 500 MG PO CAPS: 500.0000 mg | ORAL_CAPSULE | Freq: Four times a day (QID) | ORAL | 0 refills | Status: AC

## 2022-08-18 NOTE — ED Provider Notes (Signed)
[facility]    CSN: [id number] Arrival date & time: 08/18/22  0827      History   Chief Complaint Chief Complaint  Patient presents with   Urinary Frequency   Dysuria    HPI Elizabeth Henderson is a 88 y.o. female.    Urinary Frequency  Dysuria   Presents to urgent care with complaint of dysuria, frequency, urgency x 1 week.  Patient states she was away when symptoms started and could not receive care.  She endorses 1 day of suprapubic pain which resolved.  She denies back pain more than usual.  She states she is treated for arthritic back pain.  Denies signs of fever.  No myalgias or chills.  Past Medical History:  Diagnosis Date   Arthritis    lower back, hands   Benign esophageal stricture    Complication of anesthesia    Diverticulosis    Diverticulosis    Dysphagia    Family history of adverse reaction to anesthesia    brother had a hard time waking up with hernia surgery as a child.   GERD (gastroesophageal reflux disease)    Headache(784.0)    h/o recurrent vascular headaches. no longer gets these   Hypercholesterolemia    Hypertension    Hypothyroidism    IBS (irritable bowel syndrome)    Leg swelling    Neck fullness 2016   dr. Nicki Reaper reviewed and found it to be nothing.  prinivil seemed to cause face and neck and tongue swelling   PONV (postoperative nausea and vomiting)    Transient global amnesia    x1, over 25 yrs ago after migraine   Urinary tract bacterial infections    Vertigo    x1 - several yrs ago.  OK after Epley maneuver    Patient Active Problem List   Diagnosis Date Noted   Chest pain 09/09/2021   Hyponatremia 09/09/2021   Carotid artery disease ([place]) 05/27/2021   Aortic atherosclerosis ([place]) 05/11/2021   Constipation 09/01/2020   Breast cancer screening 05/12/2020   CKD (chronic kidney disease), stage III ([place]) 12/30/2018   Black stools 02/11/2018   Status post left partial knee replacement 04/15/2016   Left knee pain  12/29/2015   Thyroid nodule 08/31/2015   Dysphagia 07/20/2015   Hypothyroidism 12/28/2014   Health care maintenance 12/28/2014   Right carotid bruit 06/20/2014   Hyperglycemia 12/21/2013   Back pain 12/21/2013   Leg swelling 02/18/2013   Hypercholesterolemia 08/11/2012   Hypertension 08/11/2012   IBS (irritable bowel syndrome) 08/11/2012   GERD (gastroesophageal reflux disease) 08/11/2012    Past Surgical History:  Procedure Laterality Date   ABDOMINAL HYSTERECTOMY     APPENDECTOMY     CATARACT EXTRACTION W/PHACO Right 10/12/2017   Procedure: CATARACT EXTRACTION PHACO AND INTRAOCULAR LENS PLACEMENT ([place]) RIGHT;  Surgeon: Leandrew Koyanagi, MD;  Location: [facility];  Service: Ophthalmology;  Laterality: Right;   CATARACT EXTRACTION W/PHACO Left 11/09/2017   Procedure: CATARACT EXTRACTION PHACO AND INTRAOCULAR LENS PLACEMENT ([place]) LEFT toric;  Surgeon: Leandrew Koyanagi, MD;  Location: [facility];  Service: Ophthalmology;  Laterality: Left;   CHOLECYSTECTOMY  1990   CYSTOSCOPY     x2   ESOPHAGOGASTRODUODENOSCOPY (EGD) WITH PROPOFOL N/A 08/28/2015   Procedure: ESOPHAGOGASTRODUODENOSCOPY (EGD) WITH PROPOFOL;  Surgeon: Hulen Luster, MD;  [facility] ENDOSCOPY;  Service: Gastroenterology;  Laterality: N/A;   ESOPHAGOGASTRODUODENOSCOPY (EGD) WITH PROPOFOL N/A 04/04/2017   Procedure: ESOPHAGOGASTRODUODENOSCOPY (EGD) WITH PROPOFOL;  Surgeon: Lollie Sails, MD;  Location: [facility] ENDOSCOPY;  Service: Endoscopy;  Laterality: N/A;   PARTIAL HYSTERECTOMY     PARTIAL KNEE ARTHROPLASTY Left 04/15/2016   Procedure: UNICOMPARTMENTAL KNEE;  Surgeon: Corky Mull, MD;  Location: [facility] ORS;  Service: Orthopedics;  Laterality: Left;   TONSILLECTOMY      OB History   No obstetric history on file.      Home Medications    Prior to Admission medications   Medication Sig Start Date End Date Taking? Authorizing Provider  amitriptyline (ELAVIL) 25 MG tablet TAKE 1 TABLET AT  BEDTIME 03/08/22   Dutch Quint B, FNP  amLODipine (NORVASC) 10 MG tablet TAKE 1 TABLET DAILY 08/09/22   Einar Pheasant, MD  aspirin 81 MG tablet Take 81 mg by mouth daily.    [provider]  atorvastatin (LIPITOR) 40 MG tablet TAKE 1 TABLET DAILY 02/08/22   Einar Pheasant, MD  Calcium Carbonate-Vitamin D 600-400 MG-UNIT tablet Take 1 tablet by mouth daily.    [provider]  Cholecalciferol (VITAMIN D3) 1000 units CAPS Take by mouth.    [provider]  hydrALAZINE (APRESOLINE) 50 MG tablet Take 1 tablet (50 mg total) by mouth 2 (two) times daily. 04/28/22   Einar Pheasant, MD  metoprolol succinate (TOPROL-XL) 50 MG 24 hr tablet TAKE 1 TABLET TWICE A DAY  WITH OR IMMEDIATELY        FOLLOWING MEALS 05/30/22   Dutch Quint B, FNP  pantoprazole (PROTONIX) 40 MG tablet TAKE 1 TABLET DAILY 10/12/21   Einar Pheasant, MD  SYNTHROID 32 MCG tablet TAKE 1 TABLET DAILY BEFORE BREAKFAST 05/09/22   Kennyth Arnold, FNP    Family History Family History  Problem Relation Age of Onset   Heart disease Father    Heart disease Mother    Heart disease Brother    Heart disease Brother    Breast cancer Neg Hx    Colon cancer Neg Hx     Social History Social History   Tobacco Use   Smoking status: Never   Smokeless tobacco: Never  Vaping Use   Vaping Use: Never used  Substance Use Topics   Alcohol use: No    Alcohol/week: 0.0 standard drinks of alcohol    Comment: seldom, a glass of wine 1-3 per year.   Drug use: No     Allergies   Ace inhibitors, Prinivil [lisinopril], Rofecoxib, Flaxseed (linseed), Mercury, Ofloxacin, and Sulfa antibiotics   Review of Systems Review of Systems  Genitourinary:  Positive for dysuria and frequency.     Physical Exam Triage Vital Signs ED Triage Vitals [08/18/22 0855]  Enc Vitals Group     BP 122/70     Pulse Rate 100     Resp 15     Temp 98.1 F (36.7 C)     Temp src      SpO2 98 %     Weight      Height      Head  Circumference      Peak Flow      Pain Score 5     Pain Loc      Pain Edu?      Excl. in [place]?    No data found.  Updated Vital Signs BP 122/70   Pulse 100   Temp 98.1 F (36.7 C)   Resp 15   SpO2 98%   Visual Acuity Right Eye Distance:   Left Eye Distance:   Bilateral Distance:    Right Eye Near:  Left Eye Near:    Bilateral Near:     Physical Exam Vitals reviewed.  Constitutional:      Appearance: Normal appearance.  Skin:    General: Skin is warm and dry.  Neurological:     General: No focal deficit present.     Mental Status: She is alert and oriented to person, place, and time.  Psychiatric:        Mood and Affect: Mood normal.        Behavior: Behavior normal.      UC Treatments / Results  Labs (all labs ordered are listed, but only abnormal results are displayed) Labs Reviewed  POCT URINALYSIS DIP (MANUAL ENTRY) - Abnormal; Notable for the following components:      Result Value   Clarity, UA cloudy (*)    All other components within normal limits    EKG   Radiology No results found.  Procedures Procedures (including critical care time)  Medications Ordered in UC Medications - No data to display  Initial Impression / Assessment and Plan / UC Course  I have reviewed the triage vital signs and the nursing notes.  Pertinent labs & imaging results that were available during my care of the patient were reviewed by me and considered in my medical decision making (see chart for details).   UA could not be read.  Manual read indicates leukocytes and blood present and very cloudy urine.  Will send for urine culture to verify susceptibility.  Treating with Keflex presumptively today.   Final Clinical Impressions(s) / UC Diagnoses   Final diagnoses:  None   Discharge Instructions   None    ED Prescriptions   None    PDMP not reviewed this encounter.   Rose Phi,  08/18/22 1008

## 2022-08-18 NOTE — ED Notes (Signed)
Pt should be charged for urine dipstick. I ran urine through clintek machine x 2. The machine unable to result urine due to quality of urine.

## 2022-08-18 NOTE — Discharge Instructions (Signed)
Your urine sample provided today could not be read by our equipment.  I am treating you for presumed urinary tract infection based on our manual read.  I am sending a sample of your urine for culture to the lab.  If the results of the culture indicate a need to change your prescription, you will be contacted.  Follow up here or with your primary care provider if your symptoms are worsening or not improving with treatment.

## 2022-08-18 NOTE — ED Triage Notes (Signed)
Pt. Presents to UC w/ c/o dysuria, urinary frequency and urinary urgency for the past week.

## 2022-08-20 ENCOUNTER — Encounter: Payer: Self-pay | Admitting: Internal Medicine

## 2022-08-20 ENCOUNTER — Telehealth: Payer: Self-pay

## 2022-08-20 ENCOUNTER — Ambulatory Visit (INDEPENDENT_AMBULATORY_CARE_PROVIDER_SITE_OTHER): Payer: Medicare Other | Admitting: Internal Medicine

## 2022-08-20 VITALS — BP 140/70 | HR 95 | Temp 98.3°F | Ht 62.0 in | Wt 153.6 lb

## 2022-08-20 DIAGNOSIS — R739 Hyperglycemia, unspecified: Secondary | ICD-10-CM | POA: Diagnosis not present

## 2022-08-20 DIAGNOSIS — I779 Disorder of arteries and arterioles, unspecified: Secondary | ICD-10-CM

## 2022-08-20 DIAGNOSIS — K219 Gastro-esophageal reflux disease without esophagitis: Secondary | ICD-10-CM

## 2022-08-20 DIAGNOSIS — E7800 Pure hypercholesterolemia, unspecified: Secondary | ICD-10-CM

## 2022-08-20 DIAGNOSIS — N183 Chronic kidney disease, stage 3 unspecified: Secondary | ICD-10-CM

## 2022-08-20 DIAGNOSIS — I700 Atherosclerosis of aorta: Secondary | ICD-10-CM | POA: Diagnosis not present

## 2022-08-20 DIAGNOSIS — N3000 Acute cystitis without hematuria: Secondary | ICD-10-CM | POA: Diagnosis not present

## 2022-08-20 DIAGNOSIS — I10 Essential (primary) hypertension: Secondary | ICD-10-CM

## 2022-08-20 DIAGNOSIS — E039 Hypothyroidism, unspecified: Secondary | ICD-10-CM | POA: Diagnosis not present

## 2022-08-20 DIAGNOSIS — J3489 Other specified disorders of nose and nasal sinuses: Secondary | ICD-10-CM | POA: Diagnosis not present

## 2022-08-20 MED ORDER — MUPIROCIN 2 % EX OINT: 1.0000 | TOPICAL_OINTMENT | Freq: Two times a day (BID) | CUTANEOUS | 0 refills | Status: DC

## 2022-08-20 NOTE — Progress Notes (Unsigned)
[name], female   DOB: 06/07/1932, 88 y.o.   MRN: [id number]   Subjective:    [name], female    DOB: Oct 14, 1931, 88 y.o.   MRN: [id number]   Patient here for No chief complaint on file.  Marland Kitchen   HPI Here to follow up regarding her blood pressure, CKD and cholesterol.  Saw Dr Candiss Norse 06/07/22 - recommended continuing amlodipine, hydralazine and toprol.  Seen urgent care 08/18/22 - diagnosed with cystitis. Treated with keflex. Culture pending.    Past Medical History:  Diagnosis Date   Arthritis    lower back, hands   Benign esophageal stricture    Complication of anesthesia    Diverticulosis    Diverticulosis    Dysphagia    Family history of adverse reaction to anesthesia    brother had a hard time waking up with hernia surgery as a child.   GERD (gastroesophageal reflux disease)    Headache(784.0)    h/o recurrent vascular headaches. no longer gets these   Hypercholesterolemia    Hypertension    Hypothyroidism    IBS (irritable bowel syndrome)    Leg swelling    Neck fullness 2016   dr. Nicki Reaper reviewed and found it to be nothing.  prinivil seemed to cause face and neck and tongue swelling   PONV (postoperative nausea and vomiting)    Transient global amnesia    x1, over 25 yrs ago after migraine   Urinary tract bacterial infections    Vertigo    x1 - several yrs ago.  OK after Epley maneuver   Past Surgical History:  Procedure Laterality Date   ABDOMINAL HYSTERECTOMY     APPENDECTOMY     CATARACT EXTRACTION W/PHACO Right 10/12/2017   Procedure: CATARACT EXTRACTION PHACO AND INTRAOCULAR LENS PLACEMENT ([place]) RIGHT;  Surgeon: Leandrew Koyanagi, MD;  Location: [place];  Service: Ophthalmology;  Laterality: Right;   CATARACT EXTRACTION W/PHACO Left 11/09/2017   Procedure: CATARACT EXTRACTION PHACO AND INTRAOCULAR LENS PLACEMENT ([place]) LEFT toric;  Surgeon: Leandrew Koyanagi, MD;  Location: [place];  Service:  Ophthalmology;  Laterality: Left;   CHOLECYSTECTOMY  1990   CYSTOSCOPY     x2   ESOPHAGOGASTRODUODENOSCOPY (EGD) WITH PROPOFOL N/A 08/28/2015   Procedure: ESOPHAGOGASTRODUODENOSCOPY (EGD) WITH PROPOFOL;  Surgeon: Hulen Luster, MD;  Location: [facility] ENDOSCOPY;  Service: Gastroenterology;  Laterality: N/A;   ESOPHAGOGASTRODUODENOSCOPY (EGD) WITH PROPOFOL N/A 04/04/2017   Procedure: ESOPHAGOGASTRODUODENOSCOPY (EGD) WITH PROPOFOL;  Surgeon: Lollie Sails, MD;  Location: [facility] ENDOSCOPY;  Service: Endoscopy;  Laterality: N/A;   PARTIAL HYSTERECTOMY     PARTIAL KNEE ARTHROPLASTY Left 04/15/2016   Procedure: UNICOMPARTMENTAL KNEE;  Surgeon: Corky Mull, MD;  Location: [facility];  Service: Orthopedics;  Laterality: Left;   TONSILLECTOMY     Family History  Problem Relation Age of Onset   Heart disease Father    Heart disease Mother    Heart disease Brother    Heart disease Brother    Breast cancer Neg Hx    Colon cancer Neg Hx    Social History   Socioeconomic History   Marital status: Married    Spouse name: Not on file   Number of children: 2   Years of education: Not on file   Highest education level: Not on file  Occupational History   Occupation: retired  Tobacco Use   Smoking status: Never   Smokeless tobacco: Never  Vaping Use   Vaping Use:  Never used  Substance and Sexual Activity   Alcohol use: No    Alcohol/week: 0.0 standard drinks of alcohol    Comment: seldom, a glass of wine 1-3 per year.   Drug use: No   Sexual activity: Not Currently  Other Topics Concern   Not on file  Social History Narrative   Not on file   Social Determinants of Health   Financial Resource Strain: Low Risk  (07/30/2022)   Overall Financial Resource Strain (CARDIA)    Difficulty of Paying Living Expenses: Not hard at all  Food Insecurity: No Food Insecurity (07/30/2022)   Hunger Vital Sign    Worried About Running Out of Food in the Last Year: Never true    Ran Out of Food in the Last  Year: Never true  Transportation Needs: No Transportation Needs (07/30/2022)   PRAPARE - Hydrologist (Medical): No    Lack of Transportation (Non-Medical): No  Physical Activity: Not on file  Stress: No Stress Concern Present (07/30/2022)   [place]    Feeling of Stress : Not at all  Social Connections: Unknown (07/30/2022)   Social Connection and Isolation Panel [NHANES]    Frequency of Communication with Friends and Family: More than three times a week    Frequency of Social Gatherings with Friends and Family: More than three times a week    Attends Religious Services: More than 4 times per year    Active Member of [employer] or Organizations: Not on file    Attends Archivist Meetings: Not on file    Marital Status: Married     Review of Systems     Objective:     There were no vitals taken for this visit. Wt Readings from Last 3 Encounters:  07/30/22 157 lb (71.2 kg)  04/13/22 157 lb 6.4 oz (71.4 kg)  01/08/22 158 lb (71.7 kg)    Physical Exam   Outpatient Encounter Medications as of 08/20/2022  Medication Sig   amitriptyline (ELAVIL) 25 MG tablet TAKE 1 TABLET AT BEDTIME   amLODipine (NORVASC) 10 MG tablet TAKE 1 TABLET DAILY   aspirin 81 MG tablet Take 81 mg by mouth daily.   atorvastatin (LIPITOR) 40 MG tablet TAKE 1 TABLET DAILY   Calcium Carbonate-Vitamin D 600-400 MG-UNIT tablet Take 1 tablet by mouth daily.   cephALEXin (KEFLEX) 500 MG capsule Take 1 capsule (500 mg total) by mouth 4 (four) times daily for 7 days.   Cholecalciferol (VITAMIN D3) 1000 units CAPS Take by mouth.   hydrALAZINE (APRESOLINE) 50 MG tablet Take 1 tablet (50 mg total) by mouth 2 (two) times daily.   metoprolol succinate (TOPROL-XL) 50 MG 24 hr tablet TAKE 1 TABLET TWICE A DAY  WITH OR IMMEDIATELY        FOLLOWING MEALS   pantoprazole (PROTONIX) 40 MG tablet TAKE 1 TABLET DAILY    SYNTHROID 75 MCG tablet TAKE 1 TABLET DAILY BEFORE BREAKFAST   No facility-administered encounter medications on file as of 08/20/2022.     Lab Results  Component Value Date   WBC 7.3 01/28/2022   HGB 12.8 01/28/2022   HCT 37.7 01/28/2022   PLT 178.0 01/28/2022   GLUCOSE 111 (H) 08/18/2022   CHOL 142 08/18/2022   TRIG 122.0 08/18/2022   HDL 47.00 08/18/2022   LDLDIRECT 64.9 12/08/2012   LDLCALC 71 08/18/2022   ALT 8 08/18/2022   AST 13 08/18/2022  NA 139 08/18/2022   K 4.0 08/18/2022   CL 103 08/18/2022   CREATININE 1.40 (H) 08/18/2022   BUN 22 08/18/2022   CO2 27 08/18/2022   TSH 2.54 01/28/2022   INR 1.03 04/07/2016   HGBA1C 5.8 08/18/2022       Assessment & Plan:   Problem List Items Addressed This Visit   None    Einar Pheasant, MD

## 2022-08-20 NOTE — Telephone Encounter (Signed)
Advised pt that urine cx unable to be run.  Pt states she is taking the abx and urinary s/s are improving. Advised to complete abx as prescribed and if she has any urinary s/s upon completion can return to provide another specimen for culture or f/u with pcp. Pt agreeable, denies any questions.

## 2022-08-21 LAB — URINE CULTURE

## 2022-08-22 ENCOUNTER — Encounter: Payer: Self-pay | Admitting: Internal Medicine

## 2022-08-22 DIAGNOSIS — N390 Urinary tract infection, site not specified: Secondary | ICD-10-CM | POA: Insufficient documentation

## 2022-08-22 DIAGNOSIS — J3489 Other specified disorders of nose and nasal sinuses: Secondary | ICD-10-CM | POA: Insufficient documentation

## 2022-08-22 NOTE — Assessment & Plan Note (Signed)
Internal.  Left.  Bactroban. Notify me if does not resolve.

## 2022-08-22 NOTE — Assessment & Plan Note (Signed)
Blood pressure as outlined. Continue amlodipine, metoprolol and hydralazine.  Continue to follow pressures.  No changes today.  Follow metabolic panel.

## 2022-08-22 NOTE — Assessment & Plan Note (Signed)
Being treated with keflex.  Symptoms have improved.  Also reported the vaginal concerns as outlined.  Exam as outlined.  Dicussed muscle weakness, etc.  Discussed pelvic floor therapy.  She has previously been given exercises. Plans to start.  Will notify me if desires further evaluation and treatment.

## 2022-08-22 NOTE — Assessment & Plan Note (Signed)
Unable to take ACE inhibitor secondary to reaction (swelling).  Continue to avoid anti-inflammatories.  Blood pressure as outlined.  Continue current medication regimen as outlined.  Follow metabolic panel.  Continue follow-up with nephrology.

## 2022-08-22 NOTE — Assessment & Plan Note (Signed)
Low-carb diet and exercise.  Follow met b and A1c. ?

## 2022-08-22 NOTE — Assessment & Plan Note (Signed)
Continue lipitor  ?

## 2022-08-22 NOTE — Assessment & Plan Note (Signed)
05/20/21: Right Carotid: Velocities in the right ICA are consistent with a 1-39% stenosis.                The ECA appears <50% stenosed.   Left Carotid: Velocities in the left ICA are consistent with a 40-59% stenosis.               The ECA appears <50% stenosed. Continue lipitor.

## 2022-08-22 NOTE — Assessment & Plan Note (Signed)
On thyroid replacement.  Follow TSH 

## 2022-08-22 NOTE — Assessment & Plan Note (Signed)
If takes protonix regularly - no symptoms.  Follow.

## 2022-08-22 NOTE — Assessment & Plan Note (Signed)
Continue on Lipitor.  Low-cholesterol diet and exercise.  Follow lipid panel liver function testing.

## 2022-08-24 ENCOUNTER — Other Ambulatory Visit: Payer: Self-pay | Admitting: Family

## 2022-08-27 DIAGNOSIS — H02005 Unspecified entropion of left lower eyelid: Secondary | ICD-10-CM | POA: Diagnosis not present

## 2022-09-22 DIAGNOSIS — D2262 Melanocytic nevi of left upper limb, including shoulder: Secondary | ICD-10-CM | POA: Diagnosis not present

## 2022-09-22 DIAGNOSIS — D2261 Melanocytic nevi of right upper limb, including shoulder: Secondary | ICD-10-CM | POA: Diagnosis not present

## 2022-09-22 DIAGNOSIS — D2271 Melanocytic nevi of right lower limb, including hip: Secondary | ICD-10-CM | POA: Diagnosis not present

## 2022-09-22 DIAGNOSIS — D2272 Melanocytic nevi of left lower limb, including hip: Secondary | ICD-10-CM | POA: Diagnosis not present

## 2022-09-22 DIAGNOSIS — Z91038 Other insect allergy status: Secondary | ICD-10-CM | POA: Diagnosis not present

## 2022-09-22 DIAGNOSIS — L821 Other seborrheic keratosis: Secondary | ICD-10-CM | POA: Diagnosis not present

## 2022-09-22 DIAGNOSIS — D485 Neoplasm of uncertain behavior of skin: Secondary | ICD-10-CM | POA: Diagnosis not present

## 2022-09-22 DIAGNOSIS — D225 Melanocytic nevi of trunk: Secondary | ICD-10-CM | POA: Diagnosis not present

## 2022-09-28 ENCOUNTER — Other Ambulatory Visit: Payer: Self-pay | Admitting: Internal Medicine

## 2022-10-19 MED ORDER — AMITRIPTYLINE HCL 25 MG PO TABS: 25.0000 mg | ORAL_TABLET | Freq: Every day | ORAL | 1 refills | Status: DC

## 2022-10-19 NOTE — Telephone Encounter (Signed)
Rx sent in for amitriptyline to [facility]

## 2022-10-19 NOTE — Telephone Encounter (Signed)
Pt need a refill on amitriptyline sent to [facility]

## 2022-10-19 NOTE — Telephone Encounter (Signed)
Previously refilled by [facility] but you have filled in the last. Last OV 08/2022. Ok to fill for her?

## 2022-10-28 ENCOUNTER — Other Ambulatory Visit: Payer: Self-pay | Admitting: Family

## 2022-11-11 ENCOUNTER — Other Ambulatory Visit: Payer: Self-pay | Admitting: Family

## 2022-11-17 ENCOUNTER — Other Ambulatory Visit: Payer: 59

## 2022-11-30 DIAGNOSIS — R600 Localized edema: Secondary | ICD-10-CM | POA: Diagnosis not present

## 2022-11-30 DIAGNOSIS — I129 Hypertensive chronic kidney disease with stage 1 through stage 4 chronic kidney disease, or unspecified chronic kidney disease: Secondary | ICD-10-CM | POA: Diagnosis not present

## 2022-11-30 DIAGNOSIS — N1832 Chronic kidney disease, stage 3b: Secondary | ICD-10-CM | POA: Diagnosis not present

## 2022-11-30 DIAGNOSIS — I10 Essential (primary) hypertension: Secondary | ICD-10-CM | POA: Diagnosis not present

## 2022-12-07 DIAGNOSIS — I129 Hypertensive chronic kidney disease with stage 1 through stage 4 chronic kidney disease, or unspecified chronic kidney disease: Secondary | ICD-10-CM | POA: Diagnosis not present

## 2022-12-07 DIAGNOSIS — R600 Localized edema: Secondary | ICD-10-CM | POA: Diagnosis not present

## 2022-12-07 DIAGNOSIS — I10 Essential (primary) hypertension: Secondary | ICD-10-CM | POA: Diagnosis not present

## 2022-12-07 DIAGNOSIS — N1832 Chronic kidney disease, stage 3b: Secondary | ICD-10-CM | POA: Diagnosis not present

## 2022-12-16 ENCOUNTER — Other Ambulatory Visit (INDEPENDENT_AMBULATORY_CARE_PROVIDER_SITE_OTHER): Payer: Medicare Other

## 2022-12-16 DIAGNOSIS — I10 Essential (primary) hypertension: Secondary | ICD-10-CM | POA: Diagnosis not present

## 2022-12-16 DIAGNOSIS — E7800 Pure hypercholesterolemia, unspecified: Secondary | ICD-10-CM | POA: Diagnosis not present

## 2022-12-16 DIAGNOSIS — R739 Hyperglycemia, unspecified: Secondary | ICD-10-CM

## 2022-12-16 LAB — BASIC METABOLIC PANEL
BUN: 21 mg/dL (ref 6–23)
CO2: 27 mEq/L (ref 19–32)
Calcium: 9.6 mg/dL (ref 8.4–10.5)
Chloride: 104 mEq/L (ref 96–112)
Creatinine, Ser: 1.47 mg/dL — ABNORMAL HIGH (ref 0.40–1.20)
GFR: 31.71 mL/min — ABNORMAL LOW (ref >60.00)
Glucose, Bld: 106 mg/dL — ABNORMAL HIGH (ref 70–99)
Potassium: 4.3 mEq/L (ref 3.5–5.1)
Sodium: 139 mEq/L (ref 135–145)

## 2022-12-16 LAB — HEPATIC FUNCTION PANEL
ALT: 9 U/L (ref 0–35)
AST: 16 U/L (ref 0–37)
Albumin: 4.2 g/dL (ref 3.5–5.2)
Alkaline Phosphatase: 109 U/L (ref 39–117)
Bilirubin, Direct: 0.1 mg/dL (ref 0.0–0.3)
Total Bilirubin: 0.5 mg/dL (ref 0.2–1.2)
Total Protein: 7 g/dL (ref 6.0–8.3)

## 2022-12-16 LAB — LIPID PANEL
Cholesterol: 160 mg/dL (ref 0–200)
HDL: 55 mg/dL (ref >39.00)
LDL Cholesterol: 73 mg/dL (ref 0–99)
NonHDL: 104.68
Total CHOL/HDL Ratio: 3
Triglycerides: 158 mg/dL — ABNORMAL HIGH (ref 0.0–149.0)
VLDL: 31.6 mg/dL (ref 0.0–40.0)

## 2022-12-16 LAB — HEMOGLOBIN A1C: Hgb A1c MFr Bld: 5.8 % (ref 4.6–6.5)

## 2022-12-20 ENCOUNTER — Ambulatory Visit (INDEPENDENT_AMBULATORY_CARE_PROVIDER_SITE_OTHER): Payer: Medicare Other | Admitting: Internal Medicine

## 2022-12-20 ENCOUNTER — Encounter: Payer: Self-pay | Admitting: Internal Medicine

## 2022-12-20 VITALS — BP 130/70 | HR 70 | Temp 98.2°F | Resp 16 | Ht 62.0 in | Wt 153.4 lb

## 2022-12-20 DIAGNOSIS — I779 Disorder of arteries and arterioles, unspecified: Secondary | ICD-10-CM | POA: Diagnosis not present

## 2022-12-20 DIAGNOSIS — K219 Gastro-esophageal reflux disease without esophagitis: Secondary | ICD-10-CM | POA: Diagnosis not present

## 2022-12-20 DIAGNOSIS — E7800 Pure hypercholesterolemia, unspecified: Secondary | ICD-10-CM | POA: Diagnosis not present

## 2022-12-20 DIAGNOSIS — E041 Nontoxic single thyroid nodule: Secondary | ICD-10-CM

## 2022-12-20 DIAGNOSIS — R739 Hyperglycemia, unspecified: Secondary | ICD-10-CM

## 2022-12-20 DIAGNOSIS — I700 Atherosclerosis of aorta: Secondary | ICD-10-CM

## 2022-12-20 DIAGNOSIS — M545 Low back pain, unspecified: Secondary | ICD-10-CM

## 2022-12-20 DIAGNOSIS — I10 Essential (primary) hypertension: Secondary | ICD-10-CM

## 2022-12-20 DIAGNOSIS — N183 Chronic kidney disease, stage 3 unspecified: Secondary | ICD-10-CM

## 2022-12-20 DIAGNOSIS — E039 Hypothyroidism, unspecified: Secondary | ICD-10-CM | POA: Diagnosis not present

## 2022-12-20 MED ORDER — LEVOTHYROXINE SODIUM 75 MCG PO TABS: 75.0000 ug | ORAL_TABLET | Freq: Every day | ORAL | 1 refills | Status: DC

## 2022-12-20 MED ORDER — HYDRALAZINE HCL 50 MG PO TABS: 50.0000 mg | ORAL_TABLET | Freq: Two times a day (BID) | ORAL | 3 refills | Status: DC

## 2022-12-20 MED ORDER — AMITRIPTYLINE HCL 25 MG PO TABS: 25.0000 mg | ORAL_TABLET | Freq: Every day | ORAL | 1 refills | Status: DC

## 2022-12-20 MED ORDER — ATORVASTATIN CALCIUM 40 MG PO TABS: 40.0000 mg | ORAL_TABLET | Freq: Every day | ORAL | 3 refills | Status: DC

## 2022-12-20 MED ORDER — METOPROLOL SUCCINATE ER 50 MG PO TB24: ORAL_TABLET | ORAL | 1 refills | Status: DC

## 2022-12-20 MED ORDER — AMLODIPINE BESYLATE 10 MG PO TABS: 10.0000 mg | ORAL_TABLET | Freq: Every day | ORAL | 1 refills | Status: DC

## 2022-12-20 NOTE — Progress Notes (Signed)
Subjective:    Patient ID: Daleen BoBarbara N Labate, female    DOB: 12/13/1931, 89 y.o.   MRN: [id number]  Patient here for  Chief Complaint  Patient presents with   Medical Management of Chronic Issues    HPI Here to follow up regarding her blood pressure, CKD and cholesterol. Saw Dr Thedore MinsSingh 12/07/22.  No changes made.  Has been having back issues.  Injection x 2.  Not helped.  Can sleep ok.  Recommended continue f/u.  No chest pain reported.  Breathing stable.  No increased cough or congestion.  No abdominal pain or bowel change reported.  Discussed labs.     Past Medical History:  Diagnosis Date   Arthritis    lower back, hands   Benign esophageal stricture    Complication of anesthesia    Diverticulosis    Diverticulosis    Dysphagia    Family history of adverse reaction to anesthesia    brother had a hard time waking up with hernia surgery as a child.   GERD (gastroesophageal reflux disease)    Headache(784.0)    h/o recurrent vascular headaches. no longer gets these   Hypercholesterolemia    Hypertension    Hypothyroidism    IBS (irritable bowel syndrome)    Leg swelling    Neck fullness 2016   dr. Lorin Picketscott reviewed and found it to be nothing.  prinivil seemed to cause face and neck and tongue swelling   PONV (postoperative nausea and vomiting)    Transient global amnesia    x1, over 25 yrs ago after migraine   Urinary tract bacterial infections    Vertigo    x1 - several yrs ago.  OK after Epley maneuver   Past Surgical History:  Procedure Laterality Date   ABDOMINAL HYSTERECTOMY     APPENDECTOMY     CATARACT EXTRACTION W/PHACO Right 10/12/2017   Procedure: CATARACT EXTRACTION PHACO AND INTRAOCULAR LENS PLACEMENT ([place]) RIGHT;  Surgeon: Lockie MolaBrasington, Chadwick, MD;  [facility];  Service: Ophthalmology;  Laterality: Right;   CATARACT EXTRACTION W/PHACO Left 11/09/2017   Procedure: CATARACT EXTRACTION PHACO AND INTRAOCULAR LENS PLACEMENT ([place]) LEFT toric;   Surgeon: Lockie MolaBrasington, Chadwick, MD;  [facility];  Service: Ophthalmology;  Laterality: Left;   CHOLECYSTECTOMY  1990   CYSTOSCOPY     x2   ESOPHAGOGASTRODUODENOSCOPY (EGD) WITH PROPOFOL N/A 08/28/2015   Procedure: ESOPHAGOGASTRODUODENOSCOPY (EGD) WITH PROPOFOL;  Surgeon: Wallace CullensPaul Y Oh, MD;  Location[facility];  Service: Gastroenterology;  Laterality: N/A;   ESOPHAGOGASTRODUODENOSCOPY (EGD) WITH PROPOFOL N/A 04/04/2017   Procedure: ESOPHAGOGASTRODUODENOSCOPY (EGD) WITH PROPOFOL;  Surgeon: Christena DeemSkulskie, Martin U, MD;  Location: [facility];  Service: Endoscopy;  Laterality: N/A;   PARTIAL HYSTERECTOMY     PARTIAL KNEE ARTHROPLASTY Left 04/15/2016   Procedure: UNICOMPARTMENTAL KNEE;  Surgeon: Christena FlakeJohn J Poggi, MD;  Location: [facility];  Service: Orthopedics;  Laterality: Left;   TONSILLECTOMY     Family History  Problem Relation Age of Onset   Heart disease Father    Heart disease Mother    Heart disease Brother    Heart disease Brother    Breast cancer Neg Hx    Colon cancer Neg Hx    Social History   Socioeconomic History   Marital status: Married    Spouse name: Not on file   Number of children: 2   Years of education: Not on file   Highest education level: Not on file  Occupational History   Occupation: retired  Tobacco Use   Smoking status: Never   Smokeless tobacco: Never  Vaping Use   Vaping Use: Never used  Substance and Sexual Activity   Alcohol use: No    Alcohol/week: 0.0 standard drinks of alcohol    Comment: seldom, a glass of wine 1-3 per year.   Drug use: No   Sexual activity: Not Currently  Other Topics Concern   Not on file  Social History Narrative   Not on file   Social Determinants of Health   Financial Resource Strain: Low Risk  (07/30/2022)   Overall Financial Resource Strain (CARDIA)    Difficulty of Paying Living Expenses: Not hard at all  Food Insecurity: No Food Insecurity (07/30/2022)   Hunger Vital Sign    Worried About  Running Out of Food in the Last Year: Never true    Ran Out of Food in the Last Year: Never true  Transportation Needs: No Transportation Needs (07/30/2022)   PRAPARE - Administrator, Civil ServiceTransportation    Lack of Transportation (Medical): No    Lack of Transportation (Non-Medical): No  Physical Activity: Not on file  Stress: No Stress Concern Present (07/30/2022)   [employer] - Occupational Stress Questionnaire    Feeling of Stress : Not at all  Social Connections: Unknown (07/30/2022)   Social Connection and Isolation Panel [NHANES]    Frequency of Communication with Friends and Family: More than three times a week    Frequency of Social Gatherings with Friends and Family: More than three times a week    Attends Religious Services: More than 4 times per year    Active Member of [employer] or Organizations: Not on file    Attends Banker[employer] or Organization Meetings: Not on file    Marital Status: Married     Review of Systems  Constitutional:  Negative for appetite change and unexpected weight change.  HENT:  Negative for congestion and sinus pressure.   Respiratory:  Negative for cough, chest tightness and shortness of breath.   Cardiovascular:  Negative for chest pain and palpitations.  Gastrointestinal:  Negative for abdominal pain, diarrhea, nausea and vomiting.  Genitourinary:  Negative for difficulty urinating and dysuria.  Musculoskeletal:  Positive for back pain. Negative for myalgias.  Skin:  Negative for color change and rash.  Neurological:  Negative for dizziness and headaches.  Psychiatric/Behavioral:  Negative for agitation and dysphoric mood.        Objective:     BP 130/70   Pulse 70   Temp 98.2 F (36.8 C)   Resp 16   Ht 5\' 2"  (1.575 m)   Wt 153 lb 6.4 oz (69.6 kg)   SpO2 98%   BMI 28.06 kg/m  Wt Readings from Last 3 Encounters:  12/20/22 153 lb 6.4 oz (69.6 kg)  08/20/22 153 lb 9.6 oz (69.7 kg)  07/30/22 157 lb (71.2 kg)    Physical  Exam Vitals reviewed.  Constitutional:      General: She is not in acute distress.    Appearance: Normal appearance.  HENT:     Head: Normocephalic and atraumatic.     Right Ear: External ear normal.     Left Ear: External ear normal.  Eyes:     General: No scleral icterus.       Right eye: No discharge.        Left eye: No discharge.     Conjunctiva/sclera: Conjunctivae normal.  Neck:     Thyroid: No thyromegaly.  Cardiovascular:  Rate and Rhythm: Normal rate and regular rhythm.  Pulmonary:     Effort: No respiratory distress.     Breath sounds: Normal breath sounds. No wheezing.  Abdominal:     General: Bowel sounds are normal.     Palpations: Abdomen is soft.     Tenderness: There is no abdominal tenderness.  Musculoskeletal:        General: No swelling or tenderness.     Cervical back: Neck supple. No tenderness.  Lymphadenopathy:     Cervical: No cervical adenopathy.  Skin:    Findings: No erythema or rash.  Neurological:     Mental Status: She is alert.  Psychiatric:        Mood and Affect: Mood normal.        Behavior: Behavior normal.      Outpatient Encounter Medications as of 12/20/2022  Medication Sig   amitriptyline (ELAVIL) 25 MG tablet Take 1 tablet (25 mg total) by mouth at bedtime.   amLODipine (NORVASC) 10 MG tablet Take 1 tablet (10 mg total) by mouth daily.   aspirin 81 MG tablet Take 81 mg by mouth daily.   atorvastatin (LIPITOR) 40 MG tablet Take 1 tablet (40 mg total) by mouth daily.   Calcium Carbonate-Vitamin D 600-400 MG-UNIT tablet Take 1 tablet by mouth daily.   Cholecalciferol (VITAMIN D3) 1000 units CAPS Take by mouth.   hydrALAZINE (APRESOLINE) 50 MG tablet Take 1 tablet (50 mg total) by mouth 2 (two) times daily.   levothyroxine (SYNTHROID) 75 MCG tablet Take 1 tablet (75 mcg total) by mouth daily before breakfast.   metoprolol succinate (TOPROL-XL) 50 MG 24 hr tablet TAKE 1 TABLET TWICE A DAY  WITH OR IMMEDIATELY        FOLLOWING  MEALS   pantoprazole (PROTONIX) 40 MG tablet TAKE 1 TABLET DAILY   [DISCONTINUED] amitriptyline (ELAVIL) 25 MG tablet Take 1 tablet (25 mg total) by mouth at bedtime.   [DISCONTINUED] amLODipine (NORVASC) 10 MG tablet TAKE 1 TABLET DAILY   [DISCONTINUED] atorvastatin (LIPITOR) 40 MG tablet TAKE 1 TABLET DAILY   [DISCONTINUED] hydrALAZINE (APRESOLINE) 50 MG tablet Take 1 tablet (50 mg total) by mouth 2 (two) times daily.   [DISCONTINUED] metoprolol succinate (TOPROL-XL) 50 MG 24 hr tablet TAKE 1 TABLET TWICE A DAY  WITH OR IMMEDIATELY        FOLLOWING MEALS   [DISCONTINUED] mupirocin ointment (BACTROBAN) 2 % Apply 1 Application topically 2 (two) times daily.   [DISCONTINUED] SYNTHROID 75 MCG tablet TAKE 1 TABLET DAILY BEFORE BREAKFAST   No facility-administered encounter medications on file as of 12/20/2022.     Lab Results  Component Value Date   WBC 7.3 01/28/2022   HGB 12.8 01/28/2022   HCT 37.7 01/28/2022   PLT 178.0 01/28/2022   GLUCOSE 106 (H) 12/16/2022   CHOL 160 12/16/2022   TRIG 158.0 (H) 12/16/2022   HDL 55.00 12/16/2022   LDLDIRECT 64.9 12/08/2012   LDLCALC 73 12/16/2022   ALT 9 12/16/2022   AST 16 12/16/2022   NA 139 12/16/2022   K 4.3 12/16/2022   CL 104 12/16/2022   CREATININE 1.47 (H) 12/16/2022   BUN 21 12/16/2022   CO2 27 12/16/2022   TSH 2.54 01/28/2022   INR 1.03 04/07/2016   HGBA1C 5.8 12/16/2022    No results found.     Assessment & Plan:  Primary hypertension Assessment & Plan: Blood pressure as outlined. Continue amlodipine, metoprolol and hydralazine.  Continue to follow pressures.  No  changes today.  Follow metabolic panel.  Orders: -     Basic metabolic panel; Future -     CBC with Differential/Platelet; Future -     TSH; Future  Hypercholesterolemia Assessment & Plan: Continue on Lipitor.  Low-cholesterol diet and exercise.  Follow lipid panel liver function testing.  Orders: -     Lipid panel; Future -     Hepatic function panel;  Future  Hyperglycemia Assessment & Plan: Low-carb diet and exercise.  Follow met b and A1c.  Orders: -     Hemoglobin A1c; Future  Aortic atherosclerosis Assessment & Plan: Continue lipitor.     Left low back pain, unspecified chronicity, unspecified whether sciatica present Assessment & Plan: Seeing Dr Yves Dill.  S/p epidural injections.  Had previous reported helped.  Reports - today - no significant improvement.  Recommended follow up to discuss.  Sleeping ok.    Carotid artery disease, unspecified laterality, unspecified type Assessment & Plan: 05/20/21: Right Carotid: Velocities in the right ICA are consistent with a 1-39% stenosis.                The ECA appears <50% stenosed.   Left Carotid: Velocities in the left ICA are consistent with a 40-59% stenosis.               The ECA appears <50% stenosed. Continue lipitor.     Stage 3 chronic kidney disease, unspecified whether stage 3a or 3b CKD Assessment & Plan: Unable to take ACE inhibitor secondary to reaction (swelling).  Continue to avoid anti-inflammatories.  Blood pressure as outlined.  Continue current medication regimen as outlined.  Follow metabolic panel.  Continue follow-up with nephrology.   Gastroesophageal reflux disease, unspecified whether esophagitis present Assessment & Plan: If takes protonix regularly - no symptoms.  Follow.    Hypothyroidism, unspecified type Assessment & Plan: On thyroid replacement.  Follow TSH.   Thyroid nodule Assessment & Plan: Ultrasound 05/2020 revealed no nodules of consequence.   Other orders -     Amitriptyline HCl; Take 1 tablet (25 mg total) by mouth at bedtime.  Dispense: 90 tablet; Refill: 1 -     amLODIPine Besylate; Take 1 tablet (10 mg total) by mouth daily.  Dispense: 90 tablet; Refill: 1 -     Atorvastatin Calcium; Take 1 tablet (40 mg total) by mouth daily.  Dispense: 90 tablet; Refill: 3 -     hydrALAZINE HCl; Take 1 tablet (50 mg total) by mouth 2  (two) times daily.  Dispense: 180 tablet; Refill: 3 -     Metoprolol Succinate ER; TAKE 1 TABLET TWICE A DAY  WITH OR IMMEDIATELY        FOLLOWING MEALS  Dispense: 180 tablet; Refill: 1 -     Levothyroxine Sodium; Take 1 tablet (75 mcg total) by mouth daily before breakfast.  Dispense: 90 tablet; Refill: 1     Dale Man, MD

## 2022-12-22 ENCOUNTER — Telehealth: Payer: Self-pay | Admitting: Internal Medicine

## 2022-12-22 NOTE — Telephone Encounter (Signed)
Noted  

## 2022-12-22 NOTE — Telephone Encounter (Signed)
Pt called wanting to let the cma know that her phone is working now

## 2022-12-26 ENCOUNTER — Encounter: Payer: Self-pay | Admitting: Internal Medicine

## 2022-12-26 NOTE — Assessment & Plan Note (Signed)
05/20/21: Right Carotid: Velocities in the right ICA are consistent with a 1-39% stenosis.                The ECA appears <50% stenosed.  Left Carotid: Velocities in the left ICA are consistent with a 40-59% stenosis.               The ECA appears <50% stenosed. Continue lipitor.   

## 2022-12-26 NOTE — Assessment & Plan Note (Signed)
Ultrasound 05/2020 revealed no nodules of consequence. 

## 2022-12-26 NOTE — Assessment & Plan Note (Signed)
Seeing Dr Yves Dill.  S/p epidural injections.  Had previous reported helped.  Reports - today - no significant improvement.  Recommended follow up to discuss.  Sleeping ok.

## 2022-12-26 NOTE — Assessment & Plan Note (Signed)
Continue on Lipitor.  Low-cholesterol diet and exercise.  Follow lipid panel liver function testing. 

## 2022-12-26 NOTE — Assessment & Plan Note (Signed)
Blood pressure as outlined. Continue amlodipine, metoprolol and hydralazine.  Continue to follow pressures.  No changes today.  Follow metabolic panel. 

## 2022-12-26 NOTE — Assessment & Plan Note (Signed)
Continue lipitor  ?

## 2022-12-26 NOTE — Assessment & Plan Note (Signed)
If takes protonix regularly - no symptoms.  Follow.  

## 2022-12-26 NOTE — Assessment & Plan Note (Signed)
On thyroid replacement.  Follow TSH 

## 2022-12-26 NOTE — Assessment & Plan Note (Signed)
Unable to take ACE inhibitor secondary to reaction (swelling).  Continue to avoid anti-inflammatories.  Blood pressure as outlined.  Continue current medication regimen as outlined.  Follow metabolic panel.  Continue follow-up with nephrology. 

## 2022-12-26 NOTE — Assessment & Plan Note (Signed)
Low-carb diet and exercise.  Follow met b and A1c. 

## 2022-12-30 DIAGNOSIS — R600 Localized edema: Secondary | ICD-10-CM | POA: Diagnosis not present

## 2022-12-30 DIAGNOSIS — I10 Essential (primary) hypertension: Secondary | ICD-10-CM | POA: Diagnosis not present

## 2022-12-30 DIAGNOSIS — N1832 Chronic kidney disease, stage 3b: Secondary | ICD-10-CM | POA: Diagnosis not present

## 2022-12-30 DIAGNOSIS — I129 Hypertensive chronic kidney disease with stage 1 through stage 4 chronic kidney disease, or unspecified chronic kidney disease: Secondary | ICD-10-CM | POA: Diagnosis not present

## 2023-02-04 ENCOUNTER — Other Ambulatory Visit: Payer: Self-pay | Admitting: Internal Medicine

## 2023-02-04 DIAGNOSIS — Z1231 Encounter for screening mammogram for malignant neoplasm of breast: Secondary | ICD-10-CM

## 2023-02-25 ENCOUNTER — Ambulatory Visit
Admission: RE | Admit: 2023-02-25 | Discharge: 2023-02-25 | Disposition: A | Discharge status: Home / Self Care | Payer: Medicare Other | Source: Ambulatory Visit | Attending: Internal Medicine | Admitting: Internal Medicine

## 2023-02-25 DIAGNOSIS — Z1231 Encounter for screening mammogram for malignant neoplasm of breast: Secondary | ICD-10-CM | POA: Insufficient documentation

## 2023-02-28 ENCOUNTER — Other Ambulatory Visit: Payer: Self-pay | Admitting: Internal Medicine

## 2023-02-28 DIAGNOSIS — R928 Other abnormal and inconclusive findings on diagnostic imaging of breast: Secondary | ICD-10-CM

## 2023-02-28 DIAGNOSIS — R921 Mammographic calcification found on diagnostic imaging of breast: Secondary | ICD-10-CM

## 2023-03-03 ENCOUNTER — Ambulatory Visit
Admission: RE | Admit: 2023-03-03 | Discharge: 2023-03-03 | Disposition: A | Discharge status: Home / Self Care | Payer: Medicare Other | Source: Ambulatory Visit | Attending: Internal Medicine | Admitting: Internal Medicine

## 2023-03-03 ENCOUNTER — Other Ambulatory Visit: Payer: Self-pay | Admitting: Internal Medicine

## 2023-03-03 DIAGNOSIS — R921 Mammographic calcification found on diagnostic imaging of breast: Secondary | ICD-10-CM

## 2023-03-03 DIAGNOSIS — R928 Other abnormal and inconclusive findings on diagnostic imaging of breast: Secondary | ICD-10-CM | POA: Insufficient documentation

## 2023-03-09 ENCOUNTER — Ambulatory Visit
Admission: RE | Admit: 2023-03-09 | Discharge: 2023-03-09 | Disposition: A | Discharge status: Home / Self Care | Payer: Medicare Other | Source: Ambulatory Visit | Attending: Internal Medicine | Admitting: Internal Medicine

## 2023-03-09 DIAGNOSIS — R921 Mammographic calcification found on diagnostic imaging of breast: Secondary | ICD-10-CM | POA: Insufficient documentation

## 2023-03-09 DIAGNOSIS — R928 Other abnormal and inconclusive findings on diagnostic imaging of breast: Secondary | ICD-10-CM | POA: Insufficient documentation

## 2023-03-09 HISTORY — PX: BREAST BIOPSY: SHX20

## 2023-03-09 MED ORDER — LIDOCAINE HCL 1 % IJ SOLN
5.0000 mL | Freq: Once | INTRAMUSCULAR | Status: AC
  Administered 2023-03-09: 5 mL
  Filled 2023-03-09: qty 5

## 2023-03-09 MED ORDER — LIDOCAINE-EPINEPHRINE 1 %-1:100000 IJ SOLN
20.0000 mL | Freq: Once | INTRAMUSCULAR | Status: AC
  Administered 2023-03-09: 20 mL
  Filled 2023-03-09: qty 20

## 2023-04-08 DIAGNOSIS — H43813 Vitreous degeneration, bilateral: Secondary | ICD-10-CM | POA: Diagnosis not present

## 2023-04-08 DIAGNOSIS — H02005 Unspecified entropion of left lower eyelid: Secondary | ICD-10-CM | POA: Diagnosis not present

## 2023-04-08 DIAGNOSIS — H353131 Nonexudative age-related macular degeneration, bilateral, early dry stage: Secondary | ICD-10-CM | POA: Diagnosis not present

## 2023-04-22 ENCOUNTER — Other Ambulatory Visit (INDEPENDENT_AMBULATORY_CARE_PROVIDER_SITE_OTHER): Payer: Medicare Other

## 2023-04-22 DIAGNOSIS — E7800 Pure hypercholesterolemia, unspecified: Secondary | ICD-10-CM

## 2023-04-22 DIAGNOSIS — I10 Essential (primary) hypertension: Secondary | ICD-10-CM

## 2023-04-22 DIAGNOSIS — R739 Hyperglycemia, unspecified: Secondary | ICD-10-CM

## 2023-04-22 LAB — LIPID PANEL
Cholesterol: 163 mg/dL (ref 0–200)
HDL: 52.4 mg/dL (ref >39.00)
LDL Cholesterol: 75 mg/dL (ref 0–99)
NonHDL: 111
Total CHOL/HDL Ratio: 3
Triglycerides: 179 mg/dL — ABNORMAL HIGH (ref 0.0–149.0)
VLDL: 35.8 mg/dL (ref 0.0–40.0)

## 2023-04-22 LAB — BASIC METABOLIC PANEL
BUN: 25 mg/dL — ABNORMAL HIGH (ref 6–23)
CO2: 26 mEq/L (ref 19–32)
Calcium: 9.8 mg/dL (ref 8.4–10.5)
Chloride: 103 mEq/L (ref 96–112)
Creatinine, Ser: 1.43 mg/dL — ABNORMAL HIGH (ref 0.40–1.20)
GFR: 32.69 mL/min — ABNORMAL LOW (ref >60.00)
Glucose, Bld: 112 mg/dL — ABNORMAL HIGH (ref 70–99)
Potassium: 4.4 mEq/L (ref 3.5–5.1)
Sodium: 138 mEq/L (ref 135–145)

## 2023-04-22 LAB — HEPATIC FUNCTION PANEL
ALT: 11 U/L (ref 0–35)
AST: 17 U/L (ref 0–37)
Albumin: 4.2 g/dL (ref 3.5–5.2)
Alkaline Phosphatase: 103 U/L (ref 39–117)
Bilirubin, Direct: 0.1 mg/dL (ref 0.0–0.3)
Total Bilirubin: 0.5 mg/dL (ref 0.2–1.2)
Total Protein: 7.4 g/dL (ref 6.0–8.3)

## 2023-04-22 LAB — CBC WITH DIFFERENTIAL/PLATELET
Basophils Absolute: 0.1 10*3/uL (ref 0.0–0.1)
Basophils Relative: 0.8 % (ref 0.0–3.0)
Eosinophils Absolute: 0.2 10*3/uL (ref 0.0–0.7)
Eosinophils Relative: 3 % (ref 0.0–5.0)
HCT: 38 % (ref 36.0–46.0)
Hemoglobin: 12.3 g/dL (ref 12.0–15.0)
Lymphocytes Relative: 15.4 % (ref 12.0–46.0)
Lymphs Abs: 1.1 10*3/uL (ref 0.7–4.0)
MCHC: 32.4 g/dL (ref 30.0–36.0)
MCV: 92.6 fl (ref 78.0–100.0)
Monocytes Absolute: 0.5 10*3/uL (ref 0.1–1.0)
Monocytes Relative: 7.5 % (ref 3.0–12.0)
Neutro Abs: 5.3 10*3/uL (ref 1.4–7.7)
Neutrophils Relative %: 73.3 % (ref 43.0–77.0)
Platelets: 192 10*3/uL (ref 150.0–400.0)
RBC: 4.11 Mil/uL (ref 3.87–5.11)
RDW: 13.6 % (ref 11.5–15.5)
WBC: 7.2 10*3/uL (ref 4.0–10.5)

## 2023-04-22 LAB — HEMOGLOBIN A1C: Hgb A1c MFr Bld: 5.7 % (ref 4.6–6.5)

## 2023-04-22 LAB — TSH: TSH: 3.12 u[IU]/mL (ref 0.35–5.50)

## 2023-04-26 ENCOUNTER — Ambulatory Visit: Payer: Medicare Other | Admitting: Internal Medicine

## 2023-04-26 ENCOUNTER — Encounter: Payer: Self-pay | Admitting: Internal Medicine

## 2023-04-26 VITALS — BP 130/70 | HR 70 | Temp 97.9°F | Resp 16 | Ht 62.0 in | Wt 154.0 lb

## 2023-04-26 DIAGNOSIS — N1832 Chronic kidney disease, stage 3b: Secondary | ICD-10-CM

## 2023-04-26 DIAGNOSIS — K219 Gastro-esophageal reflux disease without esophagitis: Secondary | ICD-10-CM

## 2023-04-26 DIAGNOSIS — I779 Disorder of arteries and arterioles, unspecified: Secondary | ICD-10-CM | POA: Diagnosis not present

## 2023-04-26 DIAGNOSIS — E039 Hypothyroidism, unspecified: Secondary | ICD-10-CM | POA: Diagnosis not present

## 2023-04-26 DIAGNOSIS — I10 Essential (primary) hypertension: Secondary | ICD-10-CM | POA: Diagnosis not present

## 2023-04-26 DIAGNOSIS — R739 Hyperglycemia, unspecified: Secondary | ICD-10-CM | POA: Diagnosis not present

## 2023-04-26 DIAGNOSIS — E7800 Pure hypercholesterolemia, unspecified: Secondary | ICD-10-CM | POA: Diagnosis not present

## 2023-04-26 DIAGNOSIS — I700 Atherosclerosis of aorta: Secondary | ICD-10-CM | POA: Diagnosis not present

## 2023-04-26 DIAGNOSIS — M545 Low back pain, unspecified: Secondary | ICD-10-CM | POA: Diagnosis not present

## 2023-04-26 MED ORDER — LEVOTHYROXINE SODIUM 75 MCG PO TABS: 75.0000 ug | ORAL_TABLET | Freq: Every day | ORAL | 1 refills | Status: DC

## 2023-04-26 MED ORDER — AMLODIPINE BESYLATE 10 MG PO TABS: 10.0000 mg | ORAL_TABLET | Freq: Every day | ORAL | 1 refills | Status: DC

## 2023-04-26 MED ORDER — AMITRIPTYLINE HCL 25 MG PO TABS: 25.0000 mg | ORAL_TABLET | Freq: Every day | ORAL | 1 refills | Status: DC

## 2023-04-26 MED ORDER — METOPROLOL SUCCINATE ER 50 MG PO TB24: ORAL_TABLET | ORAL | 1 refills | Status: DC

## 2023-04-26 NOTE — Assessment & Plan Note (Signed)
05/20/21: Right Carotid: Velocities in the right ICA are consistent with a 1-39% stenosis.                The ECA appears <50% stenosed.   Left Carotid: Velocities in the left ICA are consistent with a 40-59% stenosis.               The ECA appears <50% stenosed. Continue lipitor.   

## 2023-04-26 NOTE — Assessment & Plan Note (Signed)
If takes protonix regularly - no symptoms.  Follow.  

## 2023-04-26 NOTE — Assessment & Plan Note (Signed)
Unable to take ACE inhibitor secondary to reaction (swelling).  Continue to avoid anti-inflammatories.  Blood pressure as outlined.  Continue current medication regimen as outlined.  Follow metabolic panel.  Continue follow-up with nephrology.

## 2023-04-26 NOTE — Assessment & Plan Note (Signed)
Continue lipitor  ?

## 2023-04-26 NOTE — Assessment & Plan Note (Addendum)
Seeing Dr Yves Dill.  S/p epidural injections.  Had previous reported helped.  Reports - today - no significant improvement.  Wants to monitor.

## 2023-04-26 NOTE — Assessment & Plan Note (Signed)
Continue on Lipitor.  Low-cholesterol diet and exercise.  Follow lipid panel liver function testing.

## 2023-04-26 NOTE — Assessment & Plan Note (Signed)
On thyroid replacement.  Follow TSH 

## 2023-04-26 NOTE — Assessment & Plan Note (Signed)
Blood pressure as outlined. Continue amlodipine, metoprolol and hydralazine.  Continue to follow pressures.  No changes today.  Follow metabolic panel. 

## 2023-04-26 NOTE — Assessment & Plan Note (Signed)
Low-carb diet and exercise.  Follow met b and A1c. ?

## 2023-04-26 NOTE — Progress Notes (Signed)
Subjective:    [name], female    DOB: 05-10-1932, 89 y.o.   MRN: [id number]  Patient here for  Chief Complaint  Patient presents with   Medical Management of Chronic Issues    HPI Here to follow up regarding her blood pressure, CKD and cholesterol. Saw Dr Thedore Mins 12/07/22. Reviewed labs.  Kidney function is stable. Stays active.  States she is feeling good.  No chest pain.  Breathing stable.  No abdominal pain.  Bowels moving.  Taking benefiber and keeping bowels regular.  Still with back pain.  Saw Dr Yves Dill.  S/p three injections.  Still with pain. Will monitor.  No falls.    Past Medical History:  Diagnosis Date   Arthritis    lower back, hands   Benign esophageal stricture    Complication of anesthesia    Diverticulosis    Diverticulosis    Dysphagia    Family history of adverse reaction to anesthesia    brother had a hard time waking up with hernia surgery as a child.   GERD (gastroesophageal reflux disease)    Headache(784.0)    h/o recurrent vascular headaches. no longer gets these   Hypercholesterolemia    Hypertension    Hypothyroidism    IBS (irritable bowel syndrome)    Leg swelling    Neck fullness 2016   dr. Lorin Picket reviewed and found it to be nothing.  prinivil seemed to cause face and neck and tongue swelling   PONV (postoperative nausea and vomiting)    Transient global amnesia    x1, over 25 yrs ago after migraine   Urinary tract bacterial infections    Vertigo    x1 - several yrs ago.  OK after Epley maneuver   Past Surgical History:  Procedure Laterality Date   ABDOMINAL HYSTERECTOMY     APPENDECTOMY     BREAST BIOPSY Left 03/09/2023   Left Stereo X clip- path pending   BREAST BIOPSY Left 03/09/2023   MM LT BREAST BX W LOC DEV 1ST LESION IMAGE BX SPEC STEREO GUIDE 03/09/2023 [facility]   CATARACT EXTRACTION W/PHACO Right 10/12/2017   Procedure: CATARACT EXTRACTION PHACO AND INTRAOCULAR LENS PLACEMENT (IOC) RIGHT;  Surgeon:  Lockie Mola, MD;  Location: [facility];  Service: Ophthalmology;  Laterality: Right;   CATARACT EXTRACTION W/PHACO Left 11/09/2017   Procedure: CATARACT EXTRACTION PHACO AND INTRAOCULAR LENS PLACEMENT (IOC) LEFT toric;  Surgeon: Lockie Mola, MD;  Location: [facility];  Service: Ophthalmology;  Laterality: Left;   CHOLECYSTECTOMY  09/20/1988   CYSTOSCOPY     x2   ESOPHAGOGASTRODUODENOSCOPY (EGD) WITH PROPOFOL N/A 08/28/2015   Procedure: ESOPHAGOGASTRODUODENOSCOPY (EGD) WITH PROPOFOL;  Surgeon: Wallace Cullens, MD;  Location: [facility];  Service: Gastroenterology;  Laterality: N/A;   ESOPHAGOGASTRODUODENOSCOPY (EGD) WITH PROPOFOL N/A 04/04/2017   Procedure: ESOPHAGOGASTRODUODENOSCOPY (EGD) WITH PROPOFOL;  Surgeon: Christena Deem, MD;  Location: [facility];  Service: Endoscopy;  Laterality: N/A;   PARTIAL HYSTERECTOMY     PARTIAL KNEE ARTHROPLASTY Left 04/15/2016   Procedure: UNICOMPARTMENTAL KNEE;  Surgeon: Christena Flake, MD;  Location: [facility];  Service: Orthopedics;  Laterality: Left;   TONSILLECTOMY     Family History  Problem Relation Age of Onset   Heart disease Father    Heart disease Mother    Heart disease Brother    Heart disease Brother    Breast cancer Neg Hx    Colon cancer Neg Hx    Social History  Socioeconomic History   Marital status: Married    Spouse name: Not on file   Number of children: 2   Years of education: Not on file   Highest education level: Not on file  Occupational History   Occupation: retired  Tobacco Use   Smoking status: Never   Smokeless tobacco: Never  Vaping Use   Vaping status: Never Used  Substance and Sexual Activity   Alcohol use: No    Alcohol/week: 0.0 standard drinks of alcohol    Comment: seldom, a glass of wine 1-3 per year.   Drug use: No   Sexual activity: Not Currently  Other Topics Concern   Not on file  Social History Narrative   Not on file   Social Determinants of Health    Financial Resource Strain: Low Risk  (07/30/2022)   Overall Financial Resource Strain (CARDIA)    Difficulty of Paying Living Expenses: Not hard at all  Food Insecurity: No Food Insecurity (07/30/2022)   Hunger Vital Sign    Worried About Running Out of Food in the Last Year: Never true    Ran Out of Food in the Last Year: Never true  Transportation Needs: No Transportation Needs (07/30/2022)   PRAPARE - Administrator, Civil Service (Medical): No    Lack of Transportation (Non-Medical): No  Physical Activity: Not on file  Stress: No Stress Concern Present (07/30/2022)   [employer] of Occupational Health - Occupational Stress Questionnaire    Feeling of Stress : Not at all  Social Connections: Unknown (07/30/2022)   Social Connection and Isolation Panel [NHANES]    Frequency of Communication with Friends and Family: More than three times a week    Frequency of Social Gatherings with Friends and Family: More than three times a week    Attends Religious Services: More than 4 times per year    Active Member of [employer] or Organizations: Not on file    Attends Banker Meetings: Not on file    Marital Status: Married     Review of Systems  Constitutional:  Negative for appetite change and unexpected weight change.  HENT:  Negative for congestion and sinus pressure.   Respiratory:  Negative for cough, chest tightness and shortness of breath.   Cardiovascular:  Negative for chest pain and palpitations.       No increased swelling.   Gastrointestinal:  Negative for abdominal pain, diarrhea, nausea and vomiting.  Genitourinary:  Negative for difficulty urinating and dysuria.  Musculoskeletal:  Positive for back pain. Negative for joint swelling and myalgias.  Skin:  Negative for color change and rash.  Neurological:  Negative for dizziness and headaches.  Psychiatric/Behavioral:  Negative for agitation and dysphoric mood.        Objective:     BP  130/70   Pulse 70   Temp 97.9 F (36.6 C)   Resp 16   Ht 5\' 2"  (1.575 m)   Wt 154 lb (69.9 kg)   SpO2 97%   BMI 28.17 kg/m  Wt Readings from Last 3 Encounters:  04/26/23 154 lb (69.9 kg)  12/20/22 153 lb 6.4 oz (69.6 kg)  08/20/22 153 lb 9.6 oz (69.7 kg)    Physical Exam Vitals reviewed.  Constitutional:      General: She is not in acute distress.    Appearance: Normal appearance.  HENT:     Head: Normocephalic and atraumatic.     Right Ear: External ear normal.  Left Ear: External ear normal.  Eyes:     General: No scleral icterus.       Right eye: No discharge.        Left eye: No discharge.     Conjunctiva/sclera: Conjunctivae normal.  Neck:     Thyroid: No thyromegaly.  Cardiovascular:     Rate and Rhythm: Normal rate and regular rhythm.  Pulmonary:     Effort: No respiratory distress.     Breath sounds: Normal breath sounds. No wheezing.  Abdominal:     General: Bowel sounds are normal.     Palpations: Abdomen is soft.     Tenderness: There is no abdominal tenderness.  Musculoskeletal:        General: No tenderness.     Cervical back: Neck supple. No tenderness.     Comments: No increased swelling.  Stable.  DP pulses palpable and equal bilaterally.   Lymphadenopathy:     Cervical: No cervical adenopathy.  Skin:    Findings: No erythema or rash.  Neurological:     Mental Status: She is alert.  Psychiatric:        Mood and Affect: Mood normal.        Behavior: Behavior normal.      Outpatient Encounter Medications as of 04/26/2023  Medication Sig   amitriptyline (ELAVIL) 25 MG tablet Take 1 tablet (25 mg total) by mouth at bedtime.   amLODipine (NORVASC) 10 MG tablet Take 1 tablet (10 mg total) by mouth daily.   aspirin 81 MG tablet Take 81 mg by mouth daily.   atorvastatin (LIPITOR) 40 MG tablet Take 1 tablet (40 mg total) by mouth daily.   Calcium Carbonate-Vitamin D 600-400 MG-UNIT tablet Take 1 tablet by mouth daily.   Cholecalciferol (VITAMIN  D3) 1000 units CAPS Take by mouth.   hydrALAZINE (APRESOLINE) 50 MG tablet Take 1 tablet (50 mg total) by mouth 2 (two) times daily.   levothyroxine (SYNTHROID) 75 MCG tablet Take 1 tablet (75 mcg total) by mouth daily before breakfast.   metoprolol succinate (TOPROL-XL) 50 MG 24 hr tablet TAKE 1 TABLET TWICE A DAY  WITH OR IMMEDIATELY        FOLLOWING MEALS   pantoprazole (PROTONIX) 40 MG tablet TAKE 1 TABLET DAILY   [DISCONTINUED] amitriptyline (ELAVIL) 25 MG tablet Take 1 tablet (25 mg total) by mouth at bedtime.   [DISCONTINUED] amLODipine (NORVASC) 10 MG tablet Take 1 tablet (10 mg total) by mouth daily.   [DISCONTINUED] levothyroxine (SYNTHROID) 75 MCG tablet Take 1 tablet (75 mcg total) by mouth daily before breakfast.   [DISCONTINUED] metoprolol succinate (TOPROL-XL) 50 MG 24 hr tablet TAKE 1 TABLET TWICE A DAY  WITH OR IMMEDIATELY        FOLLOWING MEALS   No facility-administered encounter medications on file as of 04/26/2023.     Lab Results  Component Value Date   WBC 7.2 04/22/2023   HGB 12.3 04/22/2023   HCT 38.0 04/22/2023   PLT 192.0 04/22/2023   GLUCOSE 112 (H) 04/22/2023   CHOL 163 04/22/2023   TRIG 179.0 (H) 04/22/2023   HDL 52.40 04/22/2023   LDLDIRECT 64.9 12/08/2012   LDLCALC 75 04/22/2023   ALT 11 04/22/2023   AST 17 04/22/2023   NA 138 04/22/2023   K 4.4 04/22/2023   CL 103 04/22/2023   CREATININE 1.43 (H) 04/22/2023   BUN 25 (H) 04/22/2023   CO2 26 04/22/2023   TSH 3.12 04/22/2023   INR 1.03 04/07/2016  HGBA1C 5.7 04/22/2023    MM LT BREAST BX W LOC DEV 1ST LESION IMAGE BX SPEC STEREO GUIDE  Addendum Date: 03/17/2023   ADDENDUM REPORT: 03/17/2023 14:16 ADDENDUM: Pathology revealed BENIGN BREAST TISSUE WITH FOCAL DUCT DILATION AND FEATURES OF RUPTURE WITH ASSOCIATED CALCIFICATIONS, FOCAL APOCRINE METAPLASIA of the LEFT breast, upper outer quadrant, (x clip). This was found to be concordant by Dr. Meda Klinefelter. Pathology results were discussed with  the patient by telephone by Randa Lynn, RN Nurse Navigator. The patient reported doing well after the biopsy with tenderness at the site. Post biopsy instructions and care were reviewed and questions were answered. The patient was encouraged to call [facility] for any additional concerns. The patient was instructed to return for annual screening mammography in June 2025. Pathology results reported by Rene Kocher, RN on 03/17/2023. Electronically Signed   By: Meda Klinefelter M.D.   On: 03/17/2023 14:16   Result Date: 03/17/2023 CLINICAL DATA:  Indeterminate LEFT breast calcifications EXAM: LEFT BREAST STEREOTACTIC CORE NEEDLE BIOPSY COMPARISON:  Previous exam(s). FINDINGS: The patient and I discussed the procedure of stereotactic-guided biopsy including benefits and alternatives. We discussed the high likelihood of a successful procedure. We discussed the risks of the procedure including infection, bleeding, tissue injury, clip migration, and inadequate sampling. Informed written consent was given. The usual time out protocol was performed immediately prior to the procedure. Using sterile technique and 1% lidocaine and 1% lidocaine with epinephrine as local anesthetic, under stereotactic guidance, a 9 gauge vacuum assisted device was used to perform core needle biopsy of calcifications in the upper outer quadrant of the left breast using a superior approach. Specimen radiograph was performed showing representative calcifications in 1 of 6 samples. Specimens with calcifications are identified for pathology. Lesion quadrant: Upper outer quadrant At the conclusion of the procedure, an X shaped tissue marker clip was deployed into the biopsy cavity. Follow-up 2-view mammogram was performed and dictated separately. IMPRESSION: Stereotactic-guided biopsy of indeterminate calcifications. No apparent complications. Electronically Signed: By: Meda Klinefelter M.D. On: 03/09/2023  14:26   MM CLIP PLACEMENT LEFT  Result Date: 03/09/2023 CLINICAL DATA:  Status post stereotactic guided biopsy EXAM: 3D DIAGNOSTIC LEFT MAMMOGRAM POST STEREOTACTIC BIOPSY COMPARISON:  Previous exam(s). FINDINGS: 3D Mammographic images were obtained following stereotactic guided biopsy of calcifications. The X shaped biopsy marking clip is inferiorly displaced by approximately 2 cm. IMPRESSION: Inferior displacement of the X shaped biopsy marking clip from site of biopsy. Final Assessment: Post Procedure Mammograms for Marker Placement Electronically Signed   By: Meda Klinefelter M.D.   On: 03/09/2023 14:27      Assessment & Plan:  Carotid artery disease, unspecified laterality, unspecified type [facility]) Assessment & Plan: 05/20/21: Right Carotid: Velocities in the right ICA are consistent with a 1-39% stenosis.                The ECA appears <50% stenosed.   Left Carotid: Velocities in the left ICA are consistent with a 40-59% stenosis.               The ECA appears <50% stenosed. Continue lipitor.     Aortic atherosclerosis (HCC) Assessment & Plan: Continue lipitor.     Left low back pain, unspecified chronicity, unspecified whether sciatica present Assessment & Plan: Seeing Dr Yves Dill.  S/p epidural injections.  Had previous reported helped.  Reports - today - no significant improvement.  Wants to monitor.     Stage 3b chronic kidney  disease [facility]) Assessment & Plan: Unable to take ACE inhibitor secondary to reaction (swelling).  Continue to avoid anti-inflammatories.  Blood pressure as outlined.  Continue current medication regimen as outlined.  Follow metabolic panel.  Continue follow-up with nephrology.   Gastroesophageal reflux disease, unspecified whether esophagitis present Assessment & Plan: If takes protonix regularly - no symptoms.  Follow.    Hypercholesterolemia Assessment & Plan: Continue on Lipitor.  Low-cholesterol diet and exercise.  Follow lipid panel liver  function testing.   Hyperglycemia Assessment & Plan: Low-carb diet and exercise.  Follow met b and A1c.   Primary hypertension Assessment & Plan: Blood pressure as outlined. Continue amlodipine, metoprolol and hydralazine.  Continue to follow pressures.  No changes today.  Follow metabolic panel.   Hypothyroidism, unspecified type Assessment & Plan: On thyroid replacement.  Follow TSH.   Other orders -     Amitriptyline HCl; Take 1 tablet (25 mg total) by mouth at bedtime.  Dispense: 90 tablet; Refill: 1 -     amLODIPine Besylate; Take 1 tablet (10 mg total) by mouth daily.  Dispense: 90 tablet; Refill: 1 -     Levothyroxine Sodium; Take 1 tablet (75 mcg total) by mouth daily before breakfast.  Dispense: 90 tablet; Refill: 1 -     Metoprolol Succinate ER; TAKE 1 TABLET TWICE A DAY  WITH OR IMMEDIATELY        FOLLOWING MEALS  Dispense: 180 tablet; Refill: 1     Dale [place], MD

## 2023-06-06 DIAGNOSIS — N1832 Chronic kidney disease, stage 3b: Secondary | ICD-10-CM | POA: Diagnosis not present

## 2023-06-06 DIAGNOSIS — I129 Hypertensive chronic kidney disease with stage 1 through stage 4 chronic kidney disease, or unspecified chronic kidney disease: Secondary | ICD-10-CM | POA: Diagnosis not present

## 2023-06-06 DIAGNOSIS — R600 Localized edema: Secondary | ICD-10-CM | POA: Diagnosis not present

## 2023-06-06 DIAGNOSIS — I10 Essential (primary) hypertension: Secondary | ICD-10-CM | POA: Diagnosis not present

## 2023-06-13 DIAGNOSIS — R600 Localized edema: Secondary | ICD-10-CM | POA: Diagnosis not present

## 2023-06-13 DIAGNOSIS — N1832 Chronic kidney disease, stage 3b: Secondary | ICD-10-CM | POA: Diagnosis not present

## 2023-06-13 DIAGNOSIS — I10 Essential (primary) hypertension: Secondary | ICD-10-CM | POA: Diagnosis not present

## 2023-06-13 DIAGNOSIS — I129 Hypertensive chronic kidney disease with stage 1 through stage 4 chronic kidney disease, or unspecified chronic kidney disease: Secondary | ICD-10-CM | POA: Diagnosis not present

## 2023-07-06 DIAGNOSIS — Z23 Encounter for immunization: Secondary | ICD-10-CM | POA: Diagnosis not present

## 2023-07-28 ENCOUNTER — Telehealth: Payer: Self-pay

## 2023-07-28 MED ORDER — AMITRIPTYLINE HCL 25 MG PO TABS: 25.0000 mg | ORAL_TABLET | Freq: Every day | ORAL | 1 refills | Status: DC

## 2023-07-28 NOTE — Telephone Encounter (Signed)
Medication has been refilled. Pt notified via voicemail

## 2023-07-28 NOTE — Telephone Encounter (Signed)
Prescription Request  07/28/2023  LOV: Visit date not found  What is the name of the medication or equipment? amitriptyline (ELAVIL) 25 MG tablet  Have you contacted your pharmacy to request a refill? Yes   Which pharmacy would you like this sent to?  [facility] - [place], [place] - [address] [facility] AT Portal to Registered Caremark Sites [address] [place] [address] Phone: [phone] Fax: [phone]    Patient notified that their request is being sent to the clinical staff for review and that they should receive a response within 2 business days.   Please advise at [facility] [phone]  Patient states her pharmacy needs [place] to text them to verify she is on this medication.  Patient states this is a 90-day supply.  Patient states she has one pill left.

## 2023-08-03 ENCOUNTER — Ambulatory Visit: Payer: Medicare Other | Admitting: *Deleted

## 2023-08-03 VITALS — Ht 62.0 in | Wt 150.0 lb

## 2023-08-03 DIAGNOSIS — Z0000 Encounter for general adult medical examination without abnormal findings: Secondary | ICD-10-CM | POA: Diagnosis not present

## 2023-08-03 NOTE — Progress Notes (Signed)
Subjective:   Elizabeth Henderson is a 89 y.o. female who presents for Medicare Annual (Subsequent) preventive examination.  Visit Complete: Virtual I connected with  Elizabeth Henderson on 08/03/23 by a audio enabled telemedicine application and verified that I am speaking with the correct person using two identifiers.  Patient Location: Home  Provider Location: Office/Clinic  I discussed the limitations of evaluation and management by telemedicine. The patient expressed understanding and agreed to proceed.  Vital Signs: Because this visit was a virtual/telehealth visit, some criteria may be missing or patient reported. Any vitals not documented were not able to be obtained and vitals that have been documented are patient reported.   Cardiac Risk Factors include: advanced age (>5men, >65 women);dyslipidemia;hypertension;Other (see comment), Risk factor comments: CAD     Objective:    Today's Vitals   08/03/23 1114  Weight: 150 lb (68 kg)  Height: 5\' 2"  (1.575 m)   Body mass index is 27.44 kg/m.     08/03/2023   11:29 AM 07/30/2022    3:49 PM 07/29/2021    2:57 PM 05/22/2020    1:45 PM 11/09/2017    9:20 AM 11/04/2017   11:24 AM 10/12/2017   10:21 AM  Advanced Directives  Does Patient Have a Medical Advance Directive? No Yes No No No No No  Type of Furniture conservator/restorer;Living will       Does patient want to make changes to medical advance directive?  No - Patient declined       Copy of Healthcare Power of Attorney in Chart?  No - copy requested       Would patient like information on creating a medical advance directive? No - Patient declined  No - Patient declined No - Patient declined No - Patient declined No - Patient declined No - Patient declined    Current Medications (verified) Outpatient Encounter Medications as of 08/03/2023  Medication Sig   amitriptyline (ELAVIL) 25 MG tablet Take 1 tablet (25 mg total) by mouth at bedtime.   amLODipine  (NORVASC) 10 MG tablet Take 1 tablet (10 mg total) by mouth daily.   aspirin 81 MG tablet Take 81 mg by mouth daily.   atorvastatin (LIPITOR) 40 MG tablet Take 1 tablet (40 mg total) by mouth daily.   Calcium Carbonate-Vitamin D 600-400 MG-UNIT tablet Take 1 tablet by mouth daily.   Cholecalciferol (VITAMIN D3) 1000 units CAPS Take by mouth.   hydrALAZINE (APRESOLINE) 50 MG tablet Take 1 tablet (50 mg total) by mouth 2 (two) times daily.   levothyroxine (SYNTHROID) 75 MCG tablet Take 1 tablet (75 mcg total) by mouth daily before breakfast.   metoprolol succinate (TOPROL-XL) 50 MG 24 hr tablet TAKE 1 TABLET TWICE A DAY  WITH OR IMMEDIATELY        FOLLOWING MEALS   pantoprazole (PROTONIX) 40 MG tablet TAKE 1 TABLET DAILY   No facility-administered encounter medications on file as of 08/03/2023.    Allergies (verified) Ace inhibitors, Prinivil [lisinopril], Rofecoxib, Flaxseed (linseed), Mercury, Ofloxacin, and Sulfa antibiotics   History: Past Medical History:  Diagnosis Date   Arthritis    lower back, hands   Benign esophageal stricture    Complication of anesthesia    Diverticulosis    Diverticulosis    Dysphagia    Family history of adverse reaction to anesthesia    brother had a hard time waking up with hernia surgery as a child.   GERD (gastroesophageal reflux disease)  Headache(784.0)    h/o recurrent vascular headaches. no longer gets these   Hypercholesterolemia    Hypertension    Hypothyroidism    IBS (irritable bowel syndrome)    Leg swelling    Neck fullness 2016   dr. Lorin Picket reviewed and found it to be nothing.  prinivil seemed to cause face and neck and tongue swelling   PONV (postoperative nausea and vomiting)    Transient global amnesia    x1, over 25 yrs ago after migraine   Urinary tract bacterial infections    Vertigo    x1 - several yrs ago.  OK after Epley maneuver   Past Surgical History:  Procedure Laterality Date   ABDOMINAL HYSTERECTOMY      APPENDECTOMY     BREAST BIOPSY Left 03/09/2023   Left Stereo X clip- path pending   BREAST BIOPSY Left 03/09/2023   MM LT BREAST BX W LOC DEV 1ST LESION IMAGE BX SPEC STEREO GUIDE 03/09/2023 [facility]   CATARACT EXTRACTION W/PHACO Right 10/12/2017   Procedure: CATARACT EXTRACTION PHACO AND INTRAOCULAR LENS PLACEMENT (IOC) RIGHT;  Surgeon: Lockie Mola, MD;  Location: [facility];  Service: Ophthalmology;  Laterality: Right;   CATARACT EXTRACTION W/PHACO Left 11/09/2017   Procedure: CATARACT EXTRACTION PHACO AND INTRAOCULAR LENS PLACEMENT (IOC) LEFT toric;  Surgeon: Lockie Mola, MD;  Location: [facility];  Service: Ophthalmology;  Laterality: Left;   CHOLECYSTECTOMY  09/20/1988   CYSTOSCOPY     x2   ESOPHAGOGASTRODUODENOSCOPY (EGD) WITH PROPOFOL N/A 08/28/2015   Procedure: ESOPHAGOGASTRODUODENOSCOPY (EGD) WITH PROPOFOL;  Surgeon: Wallace Cullens, MD;  Location: [facility];  Service: Gastroenterology;  Laterality: N/A;   ESOPHAGOGASTRODUODENOSCOPY (EGD) WITH PROPOFOL N/A 04/04/2017   Procedure: ESOPHAGOGASTRODUODENOSCOPY (EGD) WITH PROPOFOL;  Surgeon: Christena Deem, MD;  Location: [facility] END[facility];  Service: Endoscopy;  Laterality: N/A;   PARTIAL HYSTERECTOMY     PARTIAL KNEE ARTHROPLASTY Left 04/15/2016   Procedure: UNICOMPARTMENTAL KNEE;  Surgeon: Christena Flake, MD;  Location: [facility];  Service: Orthopedics;  Laterality: Left;   TONSILLECTOMY     Family History  Problem Relation Age of Onset   Heart disease Father    Heart disease Mother    Heart disease Brother    Heart disease Brother    Breast cancer Neg Hx    Colon cancer Neg Hx    Social History   Socioeconomic History   Marital status: Married    Spouse name: Not on file   Number of children: 2   Years of education: Not on file   Highest education level: Not on file  Occupational History   Occupation: retired  Tobacco Use   Smoking status: Never   Smokeless tobacco: Never   Vaping Use   Vaping status: Never Used  Substance and Sexual Activity   Alcohol use: No    Alcohol/week: 0.0 standard drinks of alcohol    Comment: seldom, a glass of wine 1-3 per year.   Drug use: No   Sexual activity: Not Currently  Other Topics Concern   Not on file  Social History Narrative   Married   Social Determinants of Health   Financial Resource Strain: Low Risk  (08/03/2023)   Overall Financial Resource Strain (CARDIA)    Difficulty of Paying Living Expenses: Not hard at all  Food Insecurity: No Food Insecurity (08/03/2023)   Hunger Vital Sign    Worried About Running Out of Food in the Last Year: Never true    Ran Out of Food  in the Last Year: Never true  Transportation Needs: No Transportation Needs (08/03/2023)   PRAPARE - Administrator, Civil Service (Medical): No    Lack of Transportation (Non-Medical): No  Physical Activity: Inactive (08/03/2023)   Exercise Vital Sign    Days of Exercise per Week: 0 days    Minutes of Exercise per Session: 0 min  Stress: No Stress Concern Present (08/03/2023)   [employer] of Occupational Health - Occupational Stress Questionnaire    Feeling of Stress : Only a little  Social Connections: Socially Integrated (08/03/2023)   Social Connection and Isolation Panel [NHANES]    Frequency of Communication with Friends and Family: More than three times a week    Frequency of Social Gatherings with Friends and Family: More than three times a week    Attends Religious Services: More than 4 times per year    Active Member of [employer] or Organizations: Yes    Attends Engineer, structural: More than 4 times per year    Marital Status: Married    Tobacco Counseling Counseling given: Not Answered   Clinical Intake:  Pre-visit preparation completed: Yes  Pain : No/denies pain     BMI - recorded: 27.44 Nutritional Status: BMI 25 -29 Overweight Nutritional Risks: None Diabetes: No  How often do you  need to have someone help you when you read instructions, pamphlets, or other written materials from your doctor or pharmacy?: 1 - Never  Interpreter Needed?: No  Information entered by :: R. Marcell Chavarin LPN   Activities of Daily Living    08/03/2023   11:16 AM  In your present state of health, do you have any difficulty performing the following activities:  Hearing? 0  Vision? 0  Comment glasses  Difficulty concentrating or making decisions? 1  Walking or climbing stairs? 1  Comment climbing stairs  Dressing or bathing? 0  Doing errands, shopping? 0  Preparing Food and eating ? N  Using the Toilet? N  In the past six months, have you accidently leaked urine? Y  Comment a few drops at times  Do you have problems with loss of bowel control? N  Managing your Medications? N  Managing your Finances? N  Housekeeping or managing your Housekeeping? N    Patient Care Team: Dale Paradise, MD as PCP - General (Internal Medicine)  Indicate any recent Medical Services you may have received from other than [facility] providers in the past year (date may be approximate).     Assessment:   This is a routine wellness examination for [place].  Hearing/Vision screen Hearing Screening - Comments:: No issues Vision Screening - Comments:: glasses   Goals Addressed             This Visit's Progress    Patient Stated       Wants to stay active and keep moving       Depression Screen    08/03/2023   11:23 AM 07/30/2022    3:50 PM 04/13/2022    1:06 PM 01/08/2022   10:11 AM 07/29/2021    2:55 PM 08/27/2020   12:18 PM 05/22/2020    1:42 PM  PHQ 2/9 Scores  PHQ - 2 Score 0 0 0 0 0 0 0  PHQ- 9 Score 1          Fall Risk    08/03/2023   11:19 AM 07/30/2022    3:59 PM 04/13/2022    1:06 PM 01/08/2022   10:11 AM  07/29/2021    2:58 PM  Fall Risk   Falls in the past year? 1 0 0 0 0  Number falls in past yr: 0 0 0  0  Injury with Fall? 1 0 0  0  Comment some bruising and soreness, did not  see a doctor      Risk for fall due to : History of fall(s);Impaired balance/gait  No Fall Risks No Fall Risks   Follow up Falls evaluation completed;Falls prevention discussed Falls evaluation completed;Falls prevention discussed Follow up appointment Falls evaluation completed Falls evaluation completed    MEDICARE RISK AT HOME: Medicare Risk at Home Any stairs in or around the home?: No If so, are there any without handrails?: No Home free of loose throw rugs in walkways, pet beds, electrical cords, etc?: Yes Adequate lighting in your home to reduce risk of falls?: Yes Life alert?: No Use of a cane, walker or w/c?: No Grab bars in the bathroom?: Yes Shower chair or bench in shower?: No Elevated toilet seat or a handicapped toilet?: Yes    Cognitive Function:    11/04/2017   11:48 AM 11/01/2016    4:20 PM 10/27/2015   10:37 AM  MMSE - Mini Mental State Exam  Orientation to time 5 5 5   Orientation to Place 5 5 5   Registration 3 3 3   Attention/ Calculation 5 5 5   Recall 2 3 3   Language- name 2 objects 2 2 2   Language- repeat 1 1 1   Language- follow 3 step command 3 3 3   Language- read & follow direction 1 1 1   Write a sentence 1 1 1   Copy design 1 1 1   Total score 29 30 30         08/03/2023   11:31 AM 07/30/2022    3:59 PM 07/29/2021    3:18 PM 05/22/2020    1:47 PM  6CIT Screen  What Year? 0 points 0 points 0 points 0 points  What month? 0 points 0 points 0 points 0 points  What time? 0 points 0 points 0 points 0 points  Count back from 20 0 points 0 points 0 points   Months in reverse 0 points 0 points 0 points   Repeat phrase 0 points 0 points 0 points   Total Score 0 points 0 points 0 points     Immunizations Immunization History  Administered Date(s) Administered   Fluad Quad(high Dose 65+) 08/01/2020, 07/25/2022   Influenza Split 07/17/2013   Influenza, High Dose Seasonal PF 07/09/2016, 05/31/2017, 06/26/2018, 06/04/2019, 06/30/2021   Influenza,inj,Quad  PF,6+ Mos 06/20/2014, 07/02/2015   Influenza-Unspecified 07/23/2012, 06/12/2013, 06/20/2014, 07/02/2015, 07/09/2016   Moderna Covid-19 Vaccine Bivalent Booster 33yrs & up 07/24/2021   Moderna SARS-COV2 Booster Vaccination 07/30/2022   Moderna Sars-Covid-2 Vaccination 10/17/2019, 11/14/2019, 07/25/2020   Pneumococcal Conjugate-13 12/17/2013   Pneumococcal Polysaccharide-23 01/28/2017   Zoster Recombinant(Shingrix) 04/07/2020, 06/13/2020   Zoster, Live 12/23/2011    TDAP status: Due, Education has been provided regarding the importance of this vaccine. Advised may receive this vaccine at local pharmacy or Health Dept. Aware to provide a copy of the vaccination record if obtained from local pharmacy or Health Dept. Verbalized acceptance and understanding.  Flu Vaccine status: Up to date  Pneumococcal vaccine status: Up to date  Covid-19 vaccine status: Completed vaccines  Qualifies for Shingles Vaccine? Yes   Zostavax completed Yes   Shingrix Completed?: Yes  Screening Tests Health Maintenance  Topic Date Due   DTaP/Tdap/Td (1 - Tdap)  Never done   COVID-19 Vaccine (6 - 2023-24 season) 05/22/2023   Medicare Annual Wellness (AWV)  07/31/2023   INFLUENZA VACCINE  12/19/2023 (Originally 04/21/2023)   MAMMOGRAM  02/25/2024   Pneumonia Vaccine 40+ Years old  Completed   DEXA SCAN  Completed   Zoster Vaccines- Shingrix  Completed   HPV VACCINES  Aged Out    Health Maintenance  Health Maintenance Due  Topic Date Due   DTaP/Tdap/Td (1 - Tdap) Never done   COVID-19 Vaccine (6 - 2023-24 season) 05/22/2023   Medicare Annual Wellness (AWV)  07/31/2023    Colorectal cancer screening: No longer required.   Mammogram status: Completed 02/2023. Repeat every year  Bone Density status: Completed 09/2009. Results reflect: Bone density results: NORMAL. Repeat every 2 years. Wants to discuss with PCP at next visit  Lung Cancer Screening: (Low Dose CT Chest recommended if Age 60-80 years, 20  pack-year currently smoking OR have quit w/in 15years.) does not qualify.   Additional Screening:  Hepatitis C Screening: does not qualify; Completed NA age  Vision Screening: Recommended annual ophthalmology exams for early detection of glaucoma and other disorders of the eye. Is the patient up to date with their annual eye exam?  Yes  Who is the provider or what is the name of the office in which the patient attends annual eye exams? [facility] If pt is not established with a provider, would they like to be referred to a provider to establish care? No .   Dental Screening: Recommended annual dental exams for proper oral hygiene    Community Resource Referral / Chronic Care Management: CRR required this visit?  No   CCM required this visit?  No     Plan:     I have personally reviewed and noted the following in the patient's chart:   Medical and social history Use of alcohol, tobacco or illicit drugs  Current medications and supplements including opioid prescriptions. Patient is not currently taking opioid prescriptions. Functional ability and status Nutritional status Physical activity Advanced directives List of other physicians Hospitalizations, surgeries, and ER visits in previous 12 months Vitals Screenings to include cognitive, depression, and falls Referrals and appointments  In addition, I have reviewed and discussed with patient certain preventive protocols, quality metrics, and best practice recommendations. A written personalized care plan for preventive services as well as general preventive health recommendations were provided to patient.     Sydell Axon, LPN   [id number]   After Visit Summary: (Mail) Due to this being a telephonic visit, the after visit summary with patients personalized plan was offered to patient via mail   Nurse Notes: None

## 2023-08-03 NOTE — Patient Instructions (Signed)
Ms. Middaugh , Thank you for taking time to come for your Medicare Wellness Visit. I appreciate your ongoing commitment to your health goals. Please review the following plan we discussed and let me know if I can assist you in the future.   Referrals/Orders/Follow-Ups/Clinician Recommendations: Remember to update you tetanus vaccine  This is a list of the screening recommended for you and due dates:  Health Maintenance  Topic Date Due   DTaP/Tdap/Td vaccine (1 - Tdap) Never done   COVID-19 Vaccine (6 - 2023-24 season) 05/22/2023   Flu Shot  12/19/2023*   Mammogram  02/25/2024   Medicare Annual Wellness Visit  08/02/2024   Pneumonia Vaccine  Completed   DEXA scan (bone density measurement)  Completed   Zoster (Shingles) Vaccine  Completed   HPV Vaccine  Aged Out  *Topic was postponed. The date shown is not the original due date.    Advanced directives: (Provided) Advance directive discussed with you today. I have provided a copy for you to complete at home and have notarized. Once this is complete, please bring a copy in to our office so we can scan it into your chart.  Will mail to patient  Next Medicare Annual Wellness Visit scheduled for next year: Yes 08/06/24 11:30

## 2023-08-17 ENCOUNTER — Telehealth: Payer: Self-pay | Admitting: Internal Medicine

## 2023-08-17 DIAGNOSIS — R739 Hyperglycemia, unspecified: Secondary | ICD-10-CM

## 2023-08-17 DIAGNOSIS — E7800 Pure hypercholesterolemia, unspecified: Secondary | ICD-10-CM

## 2023-08-17 NOTE — Telephone Encounter (Signed)
Patient need lab orders.

## 2023-08-24 ENCOUNTER — Other Ambulatory Visit (INDEPENDENT_AMBULATORY_CARE_PROVIDER_SITE_OTHER): Payer: Medicare Other

## 2023-08-24 DIAGNOSIS — R739 Hyperglycemia, unspecified: Secondary | ICD-10-CM

## 2023-08-24 DIAGNOSIS — E7800 Pure hypercholesterolemia, unspecified: Secondary | ICD-10-CM | POA: Diagnosis not present

## 2023-08-24 LAB — LIPID PANEL
Cholesterol: 138 mg/dL (ref 0–200)
HDL: 41.4 mg/dL (ref >39.00)
LDL Cholesterol: 63 mg/dL (ref 0–99)
NonHDL: 96.61
Total CHOL/HDL Ratio: 3
Triglycerides: 169 mg/dL — ABNORMAL HIGH (ref 0.0–149.0)
VLDL: 33.8 mg/dL (ref 0.0–40.0)

## 2023-08-24 LAB — BASIC METABOLIC PANEL
BUN: 21 mg/dL (ref 6–23)
CO2: 27 meq/L (ref 19–32)
Calcium: 9.6 mg/dL (ref 8.4–10.5)
Chloride: 104 meq/L (ref 96–112)
Creatinine, Ser: 1.46 mg/dL — ABNORMAL HIGH (ref 0.40–1.20)
GFR: 31.81 mL/min — ABNORMAL LOW (ref >60.00)
Glucose, Bld: 100 mg/dL — ABNORMAL HIGH (ref 70–99)
Potassium: 4.5 meq/L (ref 3.5–5.1)
Sodium: 138 meq/L (ref 135–145)

## 2023-08-24 LAB — HEPATIC FUNCTION PANEL
ALT: 9 U/L (ref 0–35)
AST: 17 U/L (ref 0–37)
Albumin: 4.1 g/dL (ref 3.5–5.2)
Alkaline Phosphatase: 102 U/L (ref 39–117)
Bilirubin, Direct: 0.2 mg/dL (ref 0.0–0.3)
Total Bilirubin: 0.6 mg/dL (ref 0.2–1.2)
Total Protein: 7.1 g/dL (ref 6.0–8.3)

## 2023-08-24 LAB — HEMOGLOBIN A1C: Hgb A1c MFr Bld: 5.7 % (ref 4.6–6.5)

## 2023-08-26 ENCOUNTER — Ambulatory Visit (INDEPENDENT_AMBULATORY_CARE_PROVIDER_SITE_OTHER): Payer: Medicare Other | Admitting: Internal Medicine

## 2023-08-26 ENCOUNTER — Encounter: Payer: Self-pay | Admitting: Internal Medicine

## 2023-08-26 VITALS — BP 128/70 | HR 80 | Temp 98.2°F | Resp 16 | Ht 62.0 in | Wt 151.4 lb

## 2023-08-26 DIAGNOSIS — E039 Hypothyroidism, unspecified: Secondary | ICD-10-CM

## 2023-08-26 DIAGNOSIS — E7800 Pure hypercholesterolemia, unspecified: Secondary | ICD-10-CM

## 2023-08-26 DIAGNOSIS — N1832 Chronic kidney disease, stage 3b: Secondary | ICD-10-CM

## 2023-08-26 DIAGNOSIS — R739 Hyperglycemia, unspecified: Secondary | ICD-10-CM | POA: Diagnosis not present

## 2023-08-26 DIAGNOSIS — I779 Disorder of arteries and arterioles, unspecified: Secondary | ICD-10-CM | POA: Diagnosis not present

## 2023-08-26 DIAGNOSIS — W19XXXD Unspecified fall, subsequent encounter: Secondary | ICD-10-CM | POA: Diagnosis not present

## 2023-08-26 DIAGNOSIS — I700 Atherosclerosis of aorta: Secondary | ICD-10-CM

## 2023-08-26 DIAGNOSIS — R0989 Other specified symptoms and signs involving the circulatory and respiratory systems: Secondary | ICD-10-CM | POA: Diagnosis not present

## 2023-08-26 DIAGNOSIS — I10 Essential (primary) hypertension: Secondary | ICD-10-CM

## 2023-08-26 DIAGNOSIS — Z0000 Encounter for general adult medical examination without abnormal findings: Secondary | ICD-10-CM

## 2023-08-26 MED ORDER — LEVOTHYROXINE SODIUM 75 MCG PO TABS: 75.0000 ug | ORAL_TABLET | Freq: Every day | ORAL | 1 refills | Status: DC

## 2023-08-26 MED ORDER — METOPROLOL SUCCINATE ER 50 MG PO TB24: ORAL_TABLET | ORAL | 1 refills | Status: DC

## 2023-08-26 MED ORDER — PANTOPRAZOLE SODIUM 40 MG PO TBEC: 40.0000 mg | DELAYED_RELEASE_TABLET | Freq: Every day | ORAL | 3 refills | Status: DC

## 2023-08-26 MED ORDER — AMLODIPINE BESYLATE 10 MG PO TABS: 10.0000 mg | ORAL_TABLET | Freq: Every day | ORAL | 1 refills | Status: DC

## 2023-08-26 NOTE — Progress Notes (Unsigned)
Subjective:    [name], female    DOB: Mar 18, 1932, 89 y.o.   MRN: [id number]  Patient here for  Chief Complaint  Patient presents with   Annual Exam    HPI With past history of hypertension, here to follow up regarding her blood pressure, CKD and cholesterol and for a complete physical exam. She saw Dr Thedore Mins 06/13/23 - stable. Reports she is doing relatively well. Tries to stay active. No chest pain or sob reported. She did trip on a chair. Stumped her toe and fell into the bathroom. This occurred one month ago. No head injury. No residual injury from the fall.  Denies any increased dizziness or unsteadiness.    Past Medical History:  Diagnosis Date   Arthritis    lower back, hands   Benign esophageal stricture    Complication of anesthesia    Diverticulosis    Diverticulosis    Dysphagia    Family history of adverse reaction to anesthesia    brother had a hard time waking up with hernia surgery as a child.   GERD (gastroesophageal reflux disease)    Headache(784.0)    h/o recurrent vascular headaches. no longer gets these   Hypercholesterolemia    Hypertension    Hypothyroidism    IBS (irritable bowel syndrome)    Leg swelling    Neck fullness 2016   dr. Lorin Picket reviewed and found it to be nothing.  prinivil seemed to cause face and neck and tongue swelling   PONV (postoperative nausea and vomiting)    Transient global amnesia    x1, over 25 yrs ago after migraine   Urinary tract bacterial infections    Vertigo    x1 - several yrs ago.  OK after Epley maneuver   Past Surgical History:  Procedure Laterality Date   ABDOMINAL HYSTERECTOMY     APPENDECTOMY     BREAST BIOPSY Left 03/09/2023   Left Stereo X clip- path pending   BREAST BIOPSY Left 03/09/2023   MM LT BREAST BX W LOC DEV 1ST LESION IMAGE BX SPEC STEREO GUIDE 03/09/2023 [facility]   CATARACT EXTRACTION W/PHACO Right 10/12/2017   Procedure: CATARACT EXTRACTION PHACO AND INTRAOCULAR LENS  PLACEMENT (IOC) RIGHT;  Surgeon: Lockie Mola, MD;  Location: [facility];  Service: Ophthalmology;  Laterality: Right;   CATARACT EXTRACTION W/PHACO Left 11/09/2017   Procedure: CATARACT EXTRACTION PHACO AND INTRAOCULAR LENS PLACEMENT (IOC) LEFT toric;  Surgeon: Lockie Mola, MD;  Location: [facility];  Service: Ophthalmology;  Laterality: Left;   CHOLECYSTECTOMY  09/20/1988   CYSTOSCOPY     x2   ESOPHAGOGASTRODUODENOSCOPY (EGD) WITH PROPOFOL N/A 08/28/2015   Procedure: ESOPHAGOGASTRODUODENOSCOPY (EGD) WITH PROPOFOL;  Surgeon: Wallace Cullens, MD;  Location: [facility];  Service: Gastroenterology;  Laterality: N/A;   ESOPHAGOGASTRODUODENOSCOPY (EGD) WITH PROPOFOL N/A 04/04/2017   Procedure: ESOPHAGOGASTRODUODENOSCOPY (EGD) WITH PROPOFOL;  Surgeon: Christena Deem, MD;  Location: [facility] END[facility];  Service: Endoscopy;  Laterality: N/A;   PARTIAL HYSTERECTOMY     PARTIAL KNEE ARTHROPLASTY Left 04/15/2016   Procedure: UNICOMPARTMENTAL KNEE;  Surgeon: Christena Flake, MD;  Location: [facility];  Service: Orthopedics;  Laterality: Left;   TONSILLECTOMY     Family History  Problem Relation Age of Onset   Heart disease Father    Heart disease Mother    Heart disease Brother    Heart disease Brother    Breast cancer Neg Hx    Colon cancer Neg Hx  Social History   Socioeconomic History   Marital status: Married    Spouse name: Not on file   Number of children: 2   Years of education: Not on file   Highest education level: Not on file  Occupational History   Occupation: retired  Tobacco Use   Smoking status: Never   Smokeless tobacco: Never  Vaping Use   Vaping status: Never Used  Substance and Sexual Activity   Alcohol use: No    Alcohol/week: 0.0 standard drinks of alcohol    Comment: seldom, a glass of wine 1-3 per year.   Drug use: No   Sexual activity: Not Currently  Other Topics Concern   Not on file  Social History Narrative   Married    Social Determinants of Health   Financial Resource Strain: Low Risk  (08/03/2023)   Overall Financial Resource Strain (CARDIA)    Difficulty of Paying Living Expenses: Not hard at all  Food Insecurity: No Food Insecurity (08/03/2023)   Hunger Vital Sign    Worried About Running Out of Food in the Last Year: Never true    Ran Out of Food in the Last Year: Never true  Transportation Needs: No Transportation Needs (08/03/2023)   PRAPARE - Administrator, Civil Service (Medical): No    Lack of Transportation (Non-Medical): No  Physical Activity: Inactive (08/03/2023)   Exercise Vital Sign    Days of Exercise per Week: 0 days    Minutes of Exercise per Session: 0 min  Stress: No Stress Concern Present (08/03/2023)   [employer] of Occupational Health - Occupational Stress Questionnaire    Feeling of Stress : Only a little  Social Connections: Socially Integrated (08/03/2023)   Social Connection and Isolation Panel [NHANES]    Frequency of Communication with Friends and Family: More than three times a week    Frequency of Social Gatherings with Friends and Family: More than three times a week    Attends Religious Services: More than 4 times per year    Active Member of [employer] or Organizations: Yes    Attends Engineer, structural: More than 4 times per year    Marital Status: Married     Review of Systems  Constitutional:  Negative for appetite change and unexpected weight change.  HENT:  Negative for congestion, sinus pressure and sore throat.   Eyes:  Negative for pain and visual disturbance.  Respiratory:  Negative for cough, chest tightness and shortness of breath.   Cardiovascular:  Negative for chest pain and palpitations.  Gastrointestinal:  Negative for abdominal pain, diarrhea, nausea and vomiting.  Genitourinary:  Negative for difficulty urinating and dysuria.  Musculoskeletal:  Negative for joint swelling and myalgias.  Skin:  Negative for  color change and rash.  Neurological:  Negative for dizziness and headaches.  Hematological:  Negative for adenopathy. Does not bruise/bleed easily.  Psychiatric/Behavioral:  Negative for agitation and dysphoric mood.        Objective:     BP 128/70   Pulse 80   Temp 98.2 F (36.8 C)   Resp 16   Ht 5\' 2"  (1.575 m)   Wt 151 lb 6.4 oz (68.7 kg)   SpO2 98%   BMI 27.69 kg/m  Wt Readings from Last 3 Encounters:  08/26/23 151 lb 6.4 oz (68.7 kg)  08/03/23 150 lb (68 kg)  04/26/23 154 lb (69.9 kg)    Physical Exam Vitals reviewed.  Constitutional:  General: She is not in acute distress.    Appearance: Normal appearance. She is well-developed.  HENT:     Head: Normocephalic and atraumatic.     Right Ear: External ear normal.     Left Ear: External ear normal.     Mouth/Throat:     Pharynx: No oropharyngeal exudate or posterior oropharyngeal erythema.  Eyes:     General: No scleral icterus.       Right eye: No discharge.        Left eye: No discharge.     Conjunctiva/sclera: Conjunctivae normal.  Neck:     Thyroid: No thyromegaly.  Cardiovascular:     Rate and Rhythm: Normal rate and regular rhythm.  Pulmonary:     Effort: No tachypnea, accessory muscle usage or respiratory distress.     Breath sounds: Normal breath sounds. No decreased breath sounds or wheezing.  Chest:  Breasts:    Right: No inverted nipple, mass, nipple discharge or tenderness (no axillary adenopathy).     Left: No inverted nipple, mass, nipple discharge or tenderness (no axilarry adenopathy).  Abdominal:     General: Bowel sounds are normal.     Palpations: Abdomen is soft.     Tenderness: There is no abdominal tenderness.  Musculoskeletal:        General: No swelling or tenderness.     Cervical back: Neck supple.  Lymphadenopathy:     Cervical: No cervical adenopathy.  Skin:    Findings: No erythema or rash.  Neurological:     Mental Status: She is alert and oriented to person, place,  and time.  Psychiatric:        Mood and Affect: Mood normal.        Behavior: Behavior normal.      Outpatient Encounter Medications as of 08/26/2023  Medication Sig   amitriptyline (ELAVIL) 25 MG tablet Take 1 tablet (25 mg total) by mouth at bedtime.   amLODipine (NORVASC) 10 MG tablet Take 1 tablet (10 mg total) by mouth daily.   aspirin 81 MG tablet Take 81 mg by mouth daily.   atorvastatin (LIPITOR) 40 MG tablet Take 1 tablet (40 mg total) by mouth daily.   Calcium Carbonate-Vitamin D 600-400 MG-UNIT tablet Take 1 tablet by mouth daily.   Cholecalciferol (VITAMIN D3) 1000 units CAPS Take by mouth.   hydrALAZINE (APRESOLINE) 50 MG tablet Take 1 tablet (50 mg total) by mouth 2 (two) times daily.   levothyroxine (SYNTHROID) 75 MCG tablet Take 1 tablet (75 mcg total) by mouth daily before breakfast.   metoprolol succinate (TOPROL-XL) 50 MG 24 hr tablet TAKE 1 TABLET TWICE A DAY  WITH OR IMMEDIATELY        FOLLOWING MEALS   pantoprazole (PROTONIX) 40 MG tablet Take 1 tablet (40 mg total) by mouth daily.   [DISCONTINUED] amLODipine (NORVASC) 10 MG tablet Take 1 tablet (10 mg total) by mouth daily.   [DISCONTINUED] levothyroxine (SYNTHROID) 75 MCG tablet Take 1 tablet (75 mcg total) by mouth daily before breakfast.   [DISCONTINUED] metoprolol succinate (TOPROL-XL) 50 MG 24 hr tablet TAKE 1 TABLET TWICE A DAY  WITH OR IMMEDIATELY        FOLLOWING MEALS   [DISCONTINUED] pantoprazole (PROTONIX) 40 MG tablet TAKE 1 TABLET DAILY   No facility-administered encounter medications on file as of 08/26/2023.     Lab Results  Component Value Date   WBC 7.2 04/22/2023   HGB 12.3 04/22/2023   HCT 38.0 04/22/2023   PLT  192.0 04/22/2023   GLUCOSE 100 (H) 08/24/2023   CHOL 138 08/24/2023   TRIG 169.0 (H) 08/24/2023   HDL 41.40 08/24/2023   LDLDIRECT 64.9 12/08/2012   LDLCALC 63 08/24/2023   ALT 9 08/24/2023   AST 17 08/24/2023   NA 138 08/24/2023   K 4.5 08/24/2023   CL 104 08/24/2023    CREATININE 1.46 (H) 08/24/2023   BUN 21 08/24/2023   CO2 27 08/24/2023   TSH 3.12 04/22/2023   INR 1.03 04/07/2016   HGBA1C 5.7 08/24/2023    MM LT BREAST BX W LOC DEV 1ST LESION IMAGE BX SPEC STEREO GUIDE  Addendum Date: 03/17/2023   ADDENDUM REPORT: 03/17/2023 14:16 ADDENDUM: Pathology revealed BENIGN BREAST TISSUE WITH FOCAL DUCT DILATION AND FEATURES OF RUPTURE WITH ASSOCIATED CALCIFICATIONS, FOCAL APOCRINE METAPLASIA of the LEFT breast, upper outer quadrant, (x clip). This was found to be concordant by Dr. Meda Klinefelter. Pathology results were discussed with the patient by telephone by Randa Lynn, RN Nurse Navigator. The patient reported doing well after the biopsy with tenderness at the site. Post biopsy instructions and care were reviewed and questions were answered. The patient was encouraged to call [facility] for any additional concerns. The patient was instructed to return for annual screening mammography in June 2025. Pathology results reported by Rene Kocher, RN on 03/17/2023. Electronically Signed   By: Meda Klinefelter M.D.   On: 03/17/2023 14:16   Result Date: 03/17/2023 CLINICAL DATA:  Indeterminate LEFT breast calcifications EXAM: LEFT BREAST STEREOTACTIC CORE NEEDLE BIOPSY COMPARISON:  Previous exam(s). FINDINGS: The patient and I discussed the procedure of stereotactic-guided biopsy including benefits and alternatives. We discussed the high likelihood of a successful procedure. We discussed the risks of the procedure including infection, bleeding, tissue injury, clip migration, and inadequate sampling. Informed written consent was given. The usual time out protocol was performed immediately prior to the procedure. Using sterile technique and 1% lidocaine and 1% lidocaine with epinephrine as local anesthetic, under stereotactic guidance, a 9 gauge vacuum assisted device was used to perform core needle biopsy of calcifications in the upper  outer quadrant of the left breast using a superior approach. Specimen radiograph was performed showing representative calcifications in 1 of 6 samples. Specimens with calcifications are identified for pathology. Lesion quadrant: Upper outer quadrant At the conclusion of the procedure, an X shaped tissue marker clip was deployed into the biopsy cavity. Follow-up 2-view mammogram was performed and dictated separately. IMPRESSION: Stereotactic-guided biopsy of indeterminate calcifications. No apparent complications. Electronically Signed: By: Meda Klinefelter M.D. On: 03/09/2023 14:26   MM CLIP PLACEMENT LEFT  Result Date: 03/09/2023 CLINICAL DATA:  Status post stereotactic guided biopsy EXAM: 3D DIAGNOSTIC LEFT MAMMOGRAM POST STEREOTACTIC BIOPSY COMPARISON:  Previous exam(s). FINDINGS: 3D Mammographic images were obtained following stereotactic guided biopsy of calcifications. The X shaped biopsy marking clip is inferiorly displaced by approximately 2 cm. IMPRESSION: Inferior displacement of the X shaped biopsy marking clip from site of biopsy. Final Assessment: Post Procedure Mammograms for Marker Placement Electronically Signed   By: Meda Klinefelter M.D.   On: 03/09/2023 14:27      Assessment & Plan:  Hypercholesterolemia Assessment & Plan: Continue on Lipitor.  Low-cholesterol diet and exercise.  Follow lipid panel liver function testing.  Orders: -     Lipid panel; Future -     Hepatic function panel; Future -     Basic metabolic panel; Future  Hyperglycemia Assessment & Plan: Low-carb diet and  exercise.  Follow met b and A1c.  Orders: -     Hemoglobin A1c; Future  Right carotid bruit Assessment & Plan: Carotid ultrasound 04/2021 - right1-39% and left 40-59%.  Continue risk factor modification.    Hypothyroidism, unspecified type Assessment & Plan: On thyroid replacement.  Follow TSH.   Primary hypertension Assessment & Plan: Blood pressure as outlined. Continue amlodipine,  metoprolol and hydralazine.  Continue to follow pressures.  No changes today.  Follow metabolic panel.   Health care maintenance Assessment & Plan: Physical today 08/26/23. Colonoscopy 02/2012 - diverticulosis. Mammogram 02/25/23- Birads 0. Recommended f/u left breast mammogram and possible ultrasound. F/u left breast mammogram - recommended biopsy.  Biopsy - ok.  Recommended continued annual screening mammogram in 02/2024.    Stage 3b chronic kidney disease (HCC) Assessment & Plan: Unable to take ACE inhibitor secondary to reaction (swelling).  Continue to avoid anti-inflammatories.  Blood pressure as outlined.  Continue current medication regimen as outlined.  Follow metabolic panel.  Continue follow-up with nephrology.   Carotid artery disease, unspecified laterality, unspecified type [facility]) Assessment & Plan: 05/20/21: Right Carotid: Velocities in the right ICA are consistent with a 1-39% stenosis.                The ECA appears <50% stenosed.   Left Carotid: Velocities in the left ICA are consistent with a 40-59% stenosis.               The ECA appears <50% stenosed. Continue lipitor.     Aortic atherosclerosis (HCC) Assessment & Plan: Continue lipitor.     Fall, subsequent encounter Assessment & Plan: Fall one moth ago. No residual problems.     Other orders -     amLODIPine Besylate; Take 1 tablet (10 mg total) by mouth daily.  Dispense: 90 tablet; Refill: 1 -     Levothyroxine Sodium; Take 1 tablet (75 mcg total) by mouth daily before breakfast.  Dispense: 90 tablet; Refill: 1 -     Metoprolol Succinate ER; TAKE 1 TABLET TWICE A DAY  WITH OR IMMEDIATELY        FOLLOWING MEALS  Dispense: 180 tablet; Refill: 1 -     Pantoprazole Sodium; Take 1 tablet (40 mg total) by mouth daily.  Dispense: 90 tablet; Refill: 3     Dale [place], MD

## 2023-08-28 ENCOUNTER — Encounter: Payer: Self-pay | Admitting: Internal Medicine

## 2023-08-28 DIAGNOSIS — W19XXXA Unspecified fall, initial encounter: Secondary | ICD-10-CM | POA: Insufficient documentation

## 2023-08-28 NOTE — Assessment & Plan Note (Signed)
Physical today 08/26/23. Colonoscopy 02/2012 - diverticulosis. Mammogram 02/25/23- Birads 0. Recommended f/u left breast mammogram and possible ultrasound. F/u left breast mammogram - recommended biopsy.  Biopsy - ok.  Recommended continued annual screening mammogram in 02/2024.

## 2023-08-28 NOTE — Assessment & Plan Note (Signed)
Continue on Lipitor.  Low-cholesterol diet and exercise.  Follow lipid panel liver function testing.

## 2023-08-28 NOTE — Assessment & Plan Note (Addendum)
Fall one moth ago. No residual problems.

## 2023-08-28 NOTE — Assessment & Plan Note (Signed)
On thyroid replacement.  Follow TSH 

## 2023-08-28 NOTE — Assessment & Plan Note (Signed)
Continue lipitor  ?

## 2023-08-28 NOTE — Assessment & Plan Note (Signed)
05/20/21: Right Carotid: Velocities in the right ICA are consistent with a 1-39% stenosis.                The ECA appears <50% stenosed.   Left Carotid: Velocities in the left ICA are consistent with a 40-59% stenosis.               The ECA appears <50% stenosed. Continue lipitor.   

## 2023-08-28 NOTE — Assessment & Plan Note (Signed)
Low-carb diet and exercise.  Follow met b and A1c. ?

## 2023-08-28 NOTE — Assessment & Plan Note (Signed)
Carotid ultrasound 04/2021 - right1-39% and left 40-59%.  Continue risk factor modification.

## 2023-08-28 NOTE — Assessment & Plan Note (Signed)
Blood pressure as outlined. Continue amlodipine, metoprolol and hydralazine.  Continue to follow pressures.  No changes today.  Follow metabolic panel. 

## 2023-08-28 NOTE — Assessment & Plan Note (Signed)
Unable to take ACE inhibitor secondary to reaction (swelling).  Continue to avoid anti-inflammatories.  Blood pressure as outlined.  Continue current medication regimen as outlined.  Follow metabolic panel.  Continue follow-up with nephrology.

## 2023-09-15 ENCOUNTER — Other Ambulatory Visit: Payer: Self-pay | Admitting: Internal Medicine

## 2023-09-23 DIAGNOSIS — D2261 Melanocytic nevi of right upper limb, including shoulder: Secondary | ICD-10-CM | POA: Diagnosis not present

## 2023-09-23 DIAGNOSIS — L821 Other seborrheic keratosis: Secondary | ICD-10-CM | POA: Diagnosis not present

## 2023-09-23 DIAGNOSIS — D2262 Melanocytic nevi of left upper limb, including shoulder: Secondary | ICD-10-CM | POA: Diagnosis not present

## 2023-09-23 DIAGNOSIS — D2271 Melanocytic nevi of right lower limb, including hip: Secondary | ICD-10-CM | POA: Diagnosis not present

## 2023-09-23 DIAGNOSIS — D2272 Melanocytic nevi of left lower limb, including hip: Secondary | ICD-10-CM | POA: Diagnosis not present

## 2023-09-23 DIAGNOSIS — D225 Melanocytic nevi of trunk: Secondary | ICD-10-CM | POA: Diagnosis not present

## 2023-11-24 ENCOUNTER — Other Ambulatory Visit: Payer: Self-pay | Admitting: Internal Medicine

## 2023-11-24 NOTE — Telephone Encounter (Signed)
 Copied from CRM [id number]. Topic: Clinical - Medication Refill >> Nov 24, 2023  2:52 PM [place] J wrote: Most Recent Primary Care Visit:  Provider: Dale [place]  Department: [facility]  Visit Type: OFFICE VISIT  Date: 08/26/2023  Medication: levothyroxine (SYNTHROID) 75 MCG tablet  Has the patient contacted their pharmacy? Yes (Agent: If no, request that the patient contact the pharmacy for the refill. If patient does not wish to contact the pharmacy document the reason why and proceed with request.) (Agent: If yes, when and what did the pharmacy advise?)  Is this the correct pharmacy for this prescription? Yes If no, delete pharmacy and type the correct one.  This is the patient's preferred pharmacy:  [facility] [address] [place], [place], [place] [address] Phone Number: [phone]  Has the prescription been filled recently? No  Is the patient out of the medication? Yes  Has the patient been seen for an appointment in the last year OR does the patient have an upcoming appointment? Yes  Can we respond through MyChart? Yes  Agent: Please be advised that Rx refills may take up to 3 business days. We ask that you follow-up with your pharmacy.

## 2023-11-25 MED ORDER — LEVOTHYROXINE SODIUM 75 MCG PO TABS: 75.0000 ug | ORAL_TABLET | Freq: Every day | ORAL | 1 refills | Status: DC

## 2023-11-28 ENCOUNTER — Other Ambulatory Visit: Payer: Self-pay | Admitting: Internal Medicine

## 2023-11-28 NOTE — Telephone Encounter (Signed)
 Copied from CRM [id number]. Topic: Clinical - Medication Refill >> Nov 28, 2023  3:55 PM Adaysia C wrote: Most Recent Primary Care Visit:  Provider: Dale [place]  Department: [facility]  Visit Type: OFFICE VISIT  Date: 08/26/2023  Medication: levothyroxine (SYNTHROID) 75 MCG tablet  Has the patient contacted their pharmacy? Yes, they did not receive the prescription RX refill request because it was sent to the wrong pharmacy  (Agent: If no, request that the patient contact the pharmacy for the refill. If patient does not wish to contact the pharmacy document the reason why and proceed with request.) (Agent: If yes, when and what did the pharmacy advise?)  Is this the correct pharmacy for this prescription? Yes If no, delete pharmacy and type the correct one.  This is the patient's preferred pharmacy:   [facility] [address] [place] - [address] [place] [address] Phone: [phone] Fax: [phone]  Has the prescription been filled recently? No  Is the patient out of the medication? Yes  Has the patient been seen for an appointment in the last year OR does the patient have an upcoming appointment? Yes  Can we respond through MyChart? No  Agent: Please be advised that Rx refills may take up to 3 business days. We ask that you follow-up with your pharmacy.

## 2023-11-28 NOTE — Telephone Encounter (Signed)
 Last Fill: 11/25/23- pt requesting alternate pharmacy.    Routing to provider for review/authorization.

## 2023-11-29 NOTE — Telephone Encounter (Signed)
 Lvm for pt to give office a call back to confirm where she would like medication to be sent

## 2023-11-29 NOTE — Telephone Encounter (Signed)
 Patient would like a call back regarding her refill

## 2023-12-06 DIAGNOSIS — I129 Hypertensive chronic kidney disease with stage 1 through stage 4 chronic kidney disease, or unspecified chronic kidney disease: Secondary | ICD-10-CM | POA: Diagnosis not present

## 2023-12-06 DIAGNOSIS — N1832 Chronic kidney disease, stage 3b: Secondary | ICD-10-CM | POA: Diagnosis not present

## 2023-12-06 DIAGNOSIS — R600 Localized edema: Secondary | ICD-10-CM | POA: Diagnosis not present

## 2023-12-06 DIAGNOSIS — I10 Essential (primary) hypertension: Secondary | ICD-10-CM | POA: Diagnosis not present

## 2023-12-12 DIAGNOSIS — R600 Localized edema: Secondary | ICD-10-CM | POA: Diagnosis not present

## 2023-12-12 DIAGNOSIS — N1832 Chronic kidney disease, stage 3b: Secondary | ICD-10-CM | POA: Diagnosis not present

## 2023-12-12 DIAGNOSIS — I129 Hypertensive chronic kidney disease with stage 1 through stage 4 chronic kidney disease, or unspecified chronic kidney disease: Secondary | ICD-10-CM | POA: Diagnosis not present

## 2023-12-12 DIAGNOSIS — I10 Essential (primary) hypertension: Secondary | ICD-10-CM | POA: Diagnosis not present

## 2023-12-23 ENCOUNTER — Other Ambulatory Visit (INDEPENDENT_AMBULATORY_CARE_PROVIDER_SITE_OTHER): Payer: 59

## 2023-12-23 DIAGNOSIS — E7800 Pure hypercholesterolemia, unspecified: Secondary | ICD-10-CM

## 2023-12-23 DIAGNOSIS — R739 Hyperglycemia, unspecified: Secondary | ICD-10-CM | POA: Diagnosis not present

## 2023-12-23 LAB — BASIC METABOLIC PANEL WITH GFR
BUN: 28 mg/dL — ABNORMAL HIGH (ref 6–23)
CO2: 26 meq/L (ref 19–32)
Calcium: 9.6 mg/dL (ref 8.4–10.5)
Chloride: 103 meq/L (ref 96–112)
Creatinine, Ser: 1.38 mg/dL — ABNORMAL HIGH (ref 0.40–1.20)
GFR: 33.96 mL/min — ABNORMAL LOW (ref >60.00)
Glucose, Bld: 112 mg/dL — ABNORMAL HIGH (ref 70–99)
Potassium: 4.3 meq/L (ref 3.5–5.1)
Sodium: 138 meq/L (ref 135–145)

## 2023-12-23 LAB — HEPATIC FUNCTION PANEL
ALT: 12 U/L (ref 0–35)
AST: 17 U/L (ref 0–37)
Albumin: 4.2 g/dL (ref 3.5–5.2)
Alkaline Phosphatase: 102 U/L (ref 39–117)
Bilirubin, Direct: 0.1 mg/dL (ref 0.0–0.3)
Total Bilirubin: 0.5 mg/dL (ref 0.2–1.2)
Total Protein: 7.3 g/dL (ref 6.0–8.3)

## 2023-12-23 LAB — LIPID PANEL
Cholesterol: 144 mg/dL (ref 0–200)
HDL: 47.9 mg/dL (ref >39.00)
LDL Cholesterol: 68 mg/dL (ref 0–99)
NonHDL: 95.75
Total CHOL/HDL Ratio: 3
Triglycerides: 137 mg/dL (ref 0.0–149.0)
VLDL: 27.4 mg/dL (ref 0.0–40.0)

## 2023-12-23 LAB — HEMOGLOBIN A1C: Hgb A1c MFr Bld: 5.7 % (ref 4.6–6.5)

## 2023-12-28 ENCOUNTER — Encounter: Payer: Self-pay | Admitting: Internal Medicine

## 2023-12-28 ENCOUNTER — Ambulatory Visit (INDEPENDENT_AMBULATORY_CARE_PROVIDER_SITE_OTHER): Payer: 59 | Admitting: Internal Medicine

## 2023-12-28 VITALS — BP 126/68 | HR 75 | Temp 98.0°F | Resp 16 | Ht 62.0 in | Wt 149.8 lb

## 2023-12-28 DIAGNOSIS — E7800 Pure hypercholesterolemia, unspecified: Secondary | ICD-10-CM

## 2023-12-28 DIAGNOSIS — W19XXXD Unspecified fall, subsequent encounter: Secondary | ICD-10-CM

## 2023-12-28 DIAGNOSIS — N1832 Chronic kidney disease, stage 3b: Secondary | ICD-10-CM

## 2023-12-28 DIAGNOSIS — I779 Disorder of arteries and arterioles, unspecified: Secondary | ICD-10-CM | POA: Diagnosis not present

## 2023-12-28 DIAGNOSIS — R739 Hyperglycemia, unspecified: Secondary | ICD-10-CM | POA: Diagnosis not present

## 2023-12-28 DIAGNOSIS — I10 Essential (primary) hypertension: Secondary | ICD-10-CM | POA: Diagnosis not present

## 2023-12-28 DIAGNOSIS — K219 Gastro-esophageal reflux disease without esophagitis: Secondary | ICD-10-CM

## 2023-12-28 DIAGNOSIS — I700 Atherosclerosis of aorta: Secondary | ICD-10-CM

## 2023-12-28 DIAGNOSIS — E039 Hypothyroidism, unspecified: Secondary | ICD-10-CM | POA: Diagnosis not present

## 2023-12-28 MED ORDER — ATORVASTATIN CALCIUM 40 MG PO TABS: 40.0000 mg | ORAL_TABLET | Freq: Every day | ORAL | 3 refills | Status: DC

## 2023-12-28 MED ORDER — HYDRALAZINE HCL 50 MG PO TABS: 50.0000 mg | ORAL_TABLET | Freq: Two times a day (BID) | ORAL | 3 refills | Status: DC

## 2023-12-28 MED ORDER — METOPROLOL SUCCINATE ER 50 MG PO TB24: ORAL_TABLET | ORAL | 1 refills | Status: DC

## 2023-12-28 MED ORDER — AMLODIPINE BESYLATE 10 MG PO TABS: 10.0000 mg | ORAL_TABLET | Freq: Every day | ORAL | 1 refills | Status: DC

## 2023-12-28 MED ORDER — AMITRIPTYLINE HCL 25 MG PO TABS: 25.0000 mg | ORAL_TABLET | Freq: Every day | ORAL | 1 refills | Status: DC

## 2023-12-28 MED ORDER — LEVOTHYROXINE SODIUM 75 MCG PO TABS: 75.0000 ug | ORAL_TABLET | Freq: Every day | ORAL | 1 refills | Status: DC

## 2023-12-28 NOTE — Progress Notes (Signed)
 Subjective:    [name], female    DOB: 1932-03-09, 89 y.o.   MRN: [id number]  Patient here for  Chief Complaint  Patient presents with   Medical Management of Chronic Issues    HPI Here for a scheduled follow up -  here to follow up regarding her blood pressure, CKD and cholesterol. Had f/u Dr Zelda Hickman - CKD - GFR 33. Discussed recent labs.  Kidney function stable on our recent check. She reports she is doing relatively well. No chest pain reported breathing stable. No increased cough or congestion. Had a fall one month ago. No head injury. Marvell Slider forward and landed on her knees. Bruise left knee - resolved. No residual problems.    Past Medical History:  Diagnosis Date   Arthritis    lower back, hands   Benign esophageal stricture    Complication of anesthesia    Diverticulosis    Diverticulosis    Dysphagia    Family history of adverse reaction to anesthesia    brother had a hard time waking up with hernia surgery as a child.   GERD (gastroesophageal reflux disease)    Headache(784.0)    h/o recurrent vascular headaches. no longer gets these   Hypercholesterolemia    Hypertension    Hypothyroidism    IBS (irritable bowel syndrome)    Leg swelling    Neck fullness 2016   dr. Geralyn Knee reviewed and found it to be nothing.  prinivil seemed to cause face and neck and tongue swelling   PONV (postoperative nausea and vomiting)    Transient global amnesia    x1, over 25 yrs ago after migraine   Urinary tract bacterial infections    Vertigo    x1 - several yrs ago.  OK after Epley maneuver   Past Surgical History:  Procedure Laterality Date   ABDOMINAL HYSTERECTOMY     APPENDECTOMY     BREAST BIOPSY Left 03/09/2023   Left Stereo X clip- path pending   BREAST BIOPSY Left 03/09/2023   MM LT BREAST BX W LOC DEV 1ST LESION IMAGE BX SPEC STEREO GUIDE 03/09/2023 [facility]   CATARACT EXTRACTION W/PHACO Right 10/12/2017   Procedure: CATARACT EXTRACTION PHACO AND  INTRAOCULAR LENS PLACEMENT (IOC) RIGHT;  Surgeon: Annell Kidney, MD;  Location: [facility];  Service: Ophthalmology;  Laterality: Right;   CATARACT EXTRACTION W/PHACO Left 11/09/2017   Procedure: CATARACT EXTRACTION PHACO AND INTRAOCULAR LENS PLACEMENT (IOC) LEFT toric;  Surgeon: Annell Kidney, MD;  Location: [facility];  Service: Ophthalmology;  Laterality: Left;   CHOLECYSTECTOMY  09/20/1988   CYSTOSCOPY     x2   ESOPHAGOGASTRODUODENOSCOPY (EGD) WITH PROPOFOL N/A 08/28/2015   Procedure: ESOPHAGOGASTRODUODENOSCOPY (EGD) WITH PROPOFOL;  Surgeon: Stephens Eis, MD;  Location: [facility];  Service: Gastroenterology;  Laterality: N/A;   ESOPHAGOGASTRODUODENOSCOPY (EGD) WITH PROPOFOL N/A 04/04/2017   Procedure: ESOPHAGOGASTRODUODENOSCOPY (EGD) WITH PROPOFOL;  Surgeon: Deveron Fly, MD;  Location: [facility] END[facility]COPY;  Service: Endoscopy;  Laterality: N/A;   PARTIAL HYSTERECTOMY     PARTIAL KNEE ARTHROPLASTY Left 04/15/2016   Procedure: UNICOMPARTMENTAL KNEE;  Surgeon: Elner Hahn, MD;  Location: [facility];  Service: Orthopedics;  Laterality: Left;   TONSILLECTOMY     Family History  Problem Relation Age of Onset   Heart disease Father    Heart disease Mother    Heart disease Brother    Heart disease Brother    Breast cancer Neg Hx    Colon cancer Neg  Hx    Social History   Socioeconomic History   Marital status: Married    Spouse name: Not on file   Number of children: 2   Years of education: Not on file   Highest education level: Not on file  Occupational History   Occupation: retired  Tobacco Use   Smoking status: Never   Smokeless tobacco: Never  Vaping Use   Vaping status: Never Used  Substance and Sexual Activity   Alcohol use: No    Alcohol/week: 0.0 standard drinks of alcohol    Comment: seldom, a glass of wine 1-3 per year.   Drug use: No   Sexual activity: Not Currently  Other Topics Concern   Not on file  Social History Narrative    Married   Social Drivers of Health   Financial Resource Strain: Low Risk  (08/03/2023)   Overall Financial Resource Strain (CARDIA)    Difficulty of Paying Living Expenses: Not hard at all  Food Insecurity: No Food Insecurity (08/03/2023)   Hunger Vital Sign    Worried About Running Out of Food in the Last Year: Never true    Ran Out of Food in the Last Year: Never true  Transportation Needs: No Transportation Needs (08/03/2023)   PRAPARE - Administrator, Civil Service (Medical): No    Lack of Transportation (Non-Medical): No  Physical Activity: Inactive (08/03/2023)   Exercise Vital Sign    Days of Exercise per Week: 0 days    Minutes of Exercise per Session: 0 min  Stress: No Stress Concern Present (08/03/2023)   [employer] of Occupational Health - Occupational Stress Questionnaire    Feeling of Stress : Only a little  Social Connections: Socially Integrated (08/03/2023)   Social Connection and Isolation Panel [NHANES]    Frequency of Communication with Friends and Family: More than three times a week    Frequency of Social Gatherings with Friends and Family: More than three times a week    Attends Religious Services: More than 4 times per year    Active Member of [employer] or Organizations: Yes    Attends Engineer, structural: More than 4 times per year    Marital Status: Married     Review of Systems  Constitutional:  Negative for appetite change and unexpected weight change.  HENT:  Negative for congestion and sinus pressure.   Respiratory:  Negative for cough, chest tightness and shortness of breath.   Cardiovascular:  Negative for chest pain and palpitations.       No increased lower extremity swelling. Stable.   Gastrointestinal:  Negative for abdominal pain, diarrhea, nausea and vomiting.  Genitourinary:  Negative for difficulty urinating and dysuria.  Musculoskeletal:  Negative for joint swelling and myalgias.  Skin:  Negative for color  change and rash.  Neurological:  Negative for dizziness and light-headedness.  Psychiatric/Behavioral:  Negative for agitation and dysphoric mood.        Objective:     BP 126/68   Pulse 75   Temp 98 F (36.7 C)   Resp 16   Ht 5\' 2"  (1.575 m)   Wt 149 lb 12.8 oz (67.9 kg)   SpO2 98%   BMI 27.40 kg/m  Wt Readings from Last 3 Encounters:  12/28/23 149 lb 12.8 oz (67.9 kg)  08/26/23 151 lb 6.4 oz (68.7 kg)  08/03/23 150 lb (68 kg)    Physical Exam Vitals reviewed.  Constitutional:      General: She  is not in acute distress.    Appearance: Normal appearance.  HENT:     Head: Normocephalic and atraumatic.     Right Ear: External ear normal.     Left Ear: External ear normal.     Mouth/Throat:     Pharynx: No oropharyngeal exudate or posterior oropharyngeal erythema.  Eyes:     General: No scleral icterus.       Right eye: No discharge.        Left eye: No discharge.     Conjunctiva/sclera: Conjunctivae normal.  Neck:     Thyroid: No thyromegaly.  Cardiovascular:     Rate and Rhythm: Normal rate and regular rhythm.  Pulmonary:     Effort: No respiratory distress.     Breath sounds: Normal breath sounds. No wheezing.  Abdominal:     General: Bowel sounds are normal.     Palpations: Abdomen is soft.     Tenderness: There is no abdominal tenderness.  Musculoskeletal:        General: No tenderness.     Cervical back: Neck supple. No tenderness.     Comments: No increased swelling. Stable.   Lymphadenopathy:     Cervical: No cervical adenopathy.  Skin:    Findings: No erythema or rash.  Neurological:     Mental Status: She is alert.  Psychiatric:        Mood and Affect: Mood normal.        Behavior: Behavior normal.         Outpatient Encounter Medications as of 12/28/2023  Medication Sig   aspirin 81 MG tablet Take 81 mg by mouth daily.   Calcium Carbonate-Vitamin D 600-400 MG-UNIT tablet Take 1 tablet by mouth daily.   Cholecalciferol (VITAMIN D3) 1000  units CAPS Take by mouth.   pantoprazole (PROTONIX) 40 MG tablet Take 1 tablet (40 mg total) by mouth daily.   amitriptyline (ELAVIL) 25 MG tablet Take 1 tablet (25 mg total) by mouth at bedtime.   amLODipine (NORVASC) 10 MG tablet Take 1 tablet (10 mg total) by mouth daily.   atorvastatin (LIPITOR) 40 MG tablet Take 1 tablet (40 mg total) by mouth daily.   hydrALAZINE (APRESOLINE) 50 MG tablet Take 1 tablet (50 mg total) by mouth 2 (two) times daily.   levothyroxine (SYNTHROID) 75 MCG tablet Take 1 tablet (75 mcg total) by mouth daily before breakfast.   metoprolol succinate (TOPROL-XL) 50 MG 24 hr tablet TAKE 1 TABLET TWICE A DAY  WITH OR IMMEDIATELY        FOLLOWING MEALS   [DISCONTINUED] amitriptyline (ELAVIL) 25 MG tablet TAKE 1 TABLET BY MOUTH EVERYDAY AT BEDTIME   [DISCONTINUED] amLODipine (NORVASC) 10 MG tablet Take 1 tablet (10 mg total) by mouth daily.   [DISCONTINUED] atorvastatin (LIPITOR) 40 MG tablet Take 1 tablet (40 mg total) by mouth daily.   [DISCONTINUED] hydrALAZINE (APRESOLINE) 50 MG tablet Take 1 tablet (50 mg total) by mouth 2 (two) times daily.   [DISCONTINUED] levothyroxine (SYNTHROID) 75 MCG tablet Take 1 tablet (75 mcg total) by mouth daily before breakfast.   [DISCONTINUED] metoprolol succinate (TOPROL-XL) 50 MG 24 hr tablet TAKE 1 TABLET TWICE A DAY  WITH OR IMMEDIATELY        FOLLOWING MEALS   No facility-administered encounter medications on file as of 12/28/2023.     Lab Results  Component Value Date   WBC 7.2 04/22/2023   HGB 12.3 04/22/2023   HCT 38.0 04/22/2023   PLT 192.0 04/22/2023  GLUCOSE 112 (H) 12/23/2023   CHOL 144 12/23/2023   TRIG 137.0 12/23/2023   HDL 47.90 12/23/2023   LDLDIRECT 64.9 12/08/2012   LDLCALC 68 12/23/2023   ALT 12 12/23/2023   AST 17 12/23/2023   NA 138 12/23/2023   K 4.3 12/23/2023   CL 103 12/23/2023   CREATININE 1.38 (H) 12/23/2023   BUN 28 (H) 12/23/2023   CO2 26 12/23/2023   TSH 3.12 04/22/2023   INR 1.03  04/07/2016   HGBA1C 5.7 12/23/2023    MM LT BREAST BX W LOC DEV 1ST LESION IMAGE BX SPEC STEREO GUIDE Addendum Date: 03/17/2023 ADDENDUM REPORT: 03/17/2023 14:16 ADDENDUM: Pathology revealed BENIGN BREAST TISSUE WITH FOCAL DUCT DILATION AND FEATURES OF RUPTURE WITH ASSOCIATED CALCIFICATIONS, FOCAL APOCRINE METAPLASIA of the LEFT breast, upper outer quadrant, (x clip). This was found to be concordant by Dr. Clancy Crimes. Pathology results were discussed with the patient by telephone by Ladonna Pickup, RN Nurse Navigator. The patient reported doing well after the biopsy with tenderness at the site. Post biopsy instructions and care were reviewed and questions were answered. The patient was encouraged to call [facility] at [facility] for any additional concerns. The patient was instructed to return for annual screening mammography in June 2025. Pathology results reported by Kraig Peru, RN on 03/17/2023. Electronically Signed   By: Clancy Crimes M.D.   On: 03/17/2023 14:16   Result Date: 03/17/2023 CLINICAL DATA:  Indeterminate LEFT breast calcifications EXAM: LEFT BREAST STEREOTACTIC CORE NEEDLE BIOPSY COMPARISON:  Previous exam(s). FINDINGS: The patient and I discussed the procedure of stereotactic-guided biopsy including benefits and alternatives. We discussed the high likelihood of a successful procedure. We discussed the risks of the procedure including infection, bleeding, tissue injury, clip migration, and inadequate sampling. Informed written consent was given. The usual time out protocol was performed immediately prior to the procedure. Using sterile technique and 1% lidocaine and 1% lidocaine with epinephrine as local anesthetic, under stereotactic guidance, a 9 gauge vacuum assisted device was used to perform core needle biopsy of calcifications in the upper outer quadrant of the left breast using a superior approach. Specimen radiograph was performed showing  representative calcifications in 1 of 6 samples. Specimens with calcifications are identified for pathology. Lesion quadrant: Upper outer quadrant At the conclusion of the procedure, an X shaped tissue marker clip was deployed into the biopsy cavity. Follow-up 2-view mammogram was performed and dictated separately. IMPRESSION: Stereotactic-guided biopsy of indeterminate calcifications. No apparent complications. Electronically Signed: By: Clancy Crimes M.D. On: 03/09/2023 14:26   MM CLIP PLACEMENT LEFT Result Date: 03/09/2023 CLINICAL DATA:  Status post stereotactic guided biopsy EXAM: 3D DIAGNOSTIC LEFT MAMMOGRAM POST STEREOTACTIC BIOPSY COMPARISON:  Previous exam(s). FINDINGS: 3D Mammographic images were obtained following stereotactic guided biopsy of calcifications. The X shaped biopsy marking clip is inferiorly displaced by approximately 2 cm. IMPRESSION: Inferior displacement of the X shaped biopsy marking clip from site of biopsy. Final Assessment: Post Procedure Mammograms for Marker Placement Electronically Signed   By: Clancy Crimes M.D.   On: 03/09/2023 14:27      Assessment & Plan:  Aortic atherosclerosis (HCC) Assessment & Plan: Continue lipitor.    Hypercholesterolemia Assessment & Plan: Continue on Lipitor.  Low-cholesterol diet and exercise.  Follow lipid panel liver function testing. No changes today.  Lab Results  Component Value Date   CHOL 144 12/23/2023   HDL 47.90 12/23/2023   LDLCALC 68 12/23/2023   LDLDIRECT 64.9 12/08/2012   TRIG  137.0 12/23/2023   CHOLHDL 3 12/23/2023     Orders: -     Lipid panel; Future -     Hepatic function panel; Future -     Basic metabolic panel with GFR; Future  Hyperglycemia Assessment & Plan: Low carb diet and exercise. Follow met b and A1c.   Orders: -     Hemoglobin A1c; Future  Primary hypertension Assessment & Plan: Blood pressure as outlined. Continue amlodipine, metoprolol and hydralazine.  Continue to follow  pressures.  No changes today.  Follow metabolic panel.   Orders: -     TSH; Future -     CBC with Differential/Platelet; Future  Carotid artery disease, unspecified laterality, unspecified type [facility]) Assessment & Plan: 05/20/21: Right Carotid: Velocities in the right ICA are consistent with a 1-39% stenosis.                The ECA appears <50% stenosed.   Left Carotid: Velocities in the left ICA are consistent with a 40-59% stenosis.               The ECA appears <50% stenosed. Continue lipitor.    Stage 3b chronic kidney disease (HCC) Assessment & Plan: Unable to take ACE inhibitor secondary to reaction (swelling).  Continue to avoid anti-inflammatories.  Blood pressure as outlined.  Continue current medication regimen as outlined.  Follow metabolic panel.  Continue follow-up with nephrology. Recent GFR stable - 33. Follow.    Fall, subsequent encounter Assessment & Plan: Recent fall - one month ago. No head injury. No residual problems. Follow    Gastroesophageal reflux disease, unspecified whether esophagitis present Assessment & Plan: No upper symptoms reported. Continue protonix.    Hypothyroidism, unspecified type Assessment & Plan: On thyroid replacement.  Follow tsh.    Other orders -     Amitriptyline HCl; Take 1 tablet (25 mg total) by mouth at bedtime.  Dispense: 90 tablet; Refill: 1 -     amLODIPine Besylate; Take 1 tablet (10 mg total) by mouth daily.  Dispense: 90 tablet; Refill: 1 -     Atorvastatin Calcium; Take 1 tablet (40 mg total) by mouth daily.  Dispense: 90 tablet; Refill: 3 -     hydrALAZINE HCl; Take 1 tablet (50 mg total) by mouth 2 (two) times daily.  Dispense: 180 tablet; Refill: 3 -     Levothyroxine Sodium; Take 1 tablet (75 mcg total) by mouth daily before breakfast.  Dispense: 90 tablet; Refill: 1 -     Metoprolol Succinate ER; TAKE 1 TABLET TWICE A DAY  WITH OR IMMEDIATELY        FOLLOWING MEALS  Dispense: 180 tablet; Refill:  1     Dellar Fenton, MD

## 2024-01-01 ENCOUNTER — Encounter: Payer: Self-pay | Admitting: Internal Medicine

## 2024-01-01 NOTE — Assessment & Plan Note (Signed)
 Unable to take ACE inhibitor secondary to reaction (swelling).  Continue to avoid anti-inflammatories.  Blood pressure as outlined.  Continue current medication regimen as outlined.  Follow metabolic panel.  Continue follow-up with nephrology. Recent GFR stable - 33. Follow.

## 2024-01-01 NOTE — Assessment & Plan Note (Signed)
 On thyroid replacement.  Follow tsh.

## 2024-01-01 NOTE — Assessment & Plan Note (Signed)
 Continue lipitor  ?

## 2024-01-01 NOTE — Assessment & Plan Note (Signed)
 Low-carb diet and exercise.  Follow met b and A1c.

## 2024-01-01 NOTE — Assessment & Plan Note (Signed)
 Recent fall - one month ago. No head injury. No residual problems. Follow

## 2024-01-01 NOTE — Assessment & Plan Note (Signed)
05/20/21: Right Carotid: Velocities in the right ICA are consistent with a 1-39% stenosis.                The ECA appears <50% stenosed.   Left Carotid: Velocities in the left ICA are consistent with a 40-59% stenosis.               The ECA appears <50% stenosed. Continue lipitor.   

## 2024-01-01 NOTE — Assessment & Plan Note (Signed)
 No upper symptoms reported.  Continue protonix.

## 2024-01-01 NOTE — Assessment & Plan Note (Signed)
Blood pressure as outlined. Continue amlodipine, metoprolol and hydralazine.  Continue to follow pressures.  No changes today.  Follow metabolic panel. 

## 2024-01-01 NOTE — Assessment & Plan Note (Addendum)
 Continue on Lipitor.  Low-cholesterol diet and exercise.  Follow lipid panel liver function testing. No changes today.  Lab Results  Component Value Date   CHOL 144 12/23/2023   HDL 47.90 12/23/2023   LDLCALC 68 12/23/2023   LDLDIRECT 64.9 12/08/2012   TRIG 137.0 12/23/2023   CHOLHDL 3 12/23/2023

## 2024-01-13 DIAGNOSIS — Z96652 Presence of left artificial knee joint: Secondary | ICD-10-CM | POA: Diagnosis not present

## 2024-01-13 DIAGNOSIS — M1712 Unilateral primary osteoarthritis, left knee: Secondary | ICD-10-CM | POA: Diagnosis not present

## 2024-01-13 DIAGNOSIS — M25562 Pain in left knee: Secondary | ICD-10-CM | POA: Diagnosis not present

## 2024-01-14 ENCOUNTER — Other Ambulatory Visit: Payer: Self-pay | Admitting: Internal Medicine

## 2024-01-19 ENCOUNTER — Other Ambulatory Visit: Payer: Self-pay | Admitting: Student

## 2024-01-19 DIAGNOSIS — Z96652 Presence of left artificial knee joint: Secondary | ICD-10-CM

## 2024-01-19 DIAGNOSIS — M25562 Pain in left knee: Secondary | ICD-10-CM

## 2024-01-25 ENCOUNTER — Encounter
Admission: RE | Admit: 2024-01-25 | Discharge: 2024-01-25 | Disposition: A | Discharge status: Home / Self Care | Source: Ambulatory Visit | Attending: Student | Admitting: Student

## 2024-01-25 DIAGNOSIS — Z471 Aftercare following joint replacement surgery: Secondary | ICD-10-CM | POA: Diagnosis not present

## 2024-01-25 DIAGNOSIS — M25462 Effusion, left knee: Secondary | ICD-10-CM | POA: Diagnosis not present

## 2024-01-25 DIAGNOSIS — M25562 Pain in left knee: Secondary | ICD-10-CM | POA: Diagnosis present

## 2024-01-25 DIAGNOSIS — Z96652 Presence of left artificial knee joint: Secondary | ICD-10-CM | POA: Diagnosis not present

## 2024-01-25 MED ORDER — TECHNETIUM TC 99M MEDRONATE IV KIT
20.0000 | PACK | Freq: Once | INTRAVENOUS | Status: AC | PRN
  Administered 2024-01-25: 21.44 via INTRAVENOUS

## 2024-03-02 DIAGNOSIS — Z96652 Presence of left artificial knee joint: Secondary | ICD-10-CM | POA: Diagnosis not present

## 2024-03-02 DIAGNOSIS — M1712 Unilateral primary osteoarthritis, left knee: Secondary | ICD-10-CM | POA: Diagnosis not present

## 2024-03-14 ENCOUNTER — Other Ambulatory Visit: Payer: Self-pay | Admitting: Internal Medicine

## 2024-04-09 DIAGNOSIS — H353131 Nonexudative age-related macular degeneration, bilateral, early dry stage: Secondary | ICD-10-CM | POA: Diagnosis not present

## 2024-04-09 DIAGNOSIS — H43813 Vitreous degeneration, bilateral: Secondary | ICD-10-CM | POA: Diagnosis not present

## 2024-04-09 DIAGNOSIS — Z961 Presence of intraocular lens: Secondary | ICD-10-CM | POA: Diagnosis not present

## 2024-04-16 ENCOUNTER — Other Ambulatory Visit: Payer: Self-pay | Admitting: Internal Medicine

## 2024-04-16 DIAGNOSIS — Z1231 Encounter for screening mammogram for malignant neoplasm of breast: Secondary | ICD-10-CM

## 2024-04-30 ENCOUNTER — Other Ambulatory Visit

## 2024-05-01 ENCOUNTER — Encounter

## 2024-05-04 ENCOUNTER — Ambulatory Visit: Admitting: Internal Medicine

## 2024-05-14 ENCOUNTER — Other Ambulatory Visit

## 2024-05-18 ENCOUNTER — Ambulatory Visit: Admitting: Internal Medicine

## 2024-05-18 DIAGNOSIS — Z96652 Presence of left artificial knee joint: Secondary | ICD-10-CM | POA: Diagnosis not present

## 2024-05-18 DIAGNOSIS — M1712 Unilateral primary osteoarthritis, left knee: Secondary | ICD-10-CM | POA: Diagnosis not present

## 2024-05-22 ENCOUNTER — Ambulatory Visit
Admission: RE | Admit: 2024-05-22 | Discharge: 2024-05-22 | Disposition: A | Discharge status: Home / Self Care | Source: Ambulatory Visit | Attending: Internal Medicine | Admitting: Internal Medicine

## 2024-05-22 DIAGNOSIS — Z1231 Encounter for screening mammogram for malignant neoplasm of breast: Secondary | ICD-10-CM | POA: Diagnosis not present

## 2024-06-05 ENCOUNTER — Ambulatory Visit (INDEPENDENT_AMBULATORY_CARE_PROVIDER_SITE_OTHER)

## 2024-06-05 ENCOUNTER — Ambulatory Visit: Admitting: Internal Medicine

## 2024-06-05 VITALS — BP 130/70 | HR 86 | Resp 16 | Ht 62.0 in | Wt 142.8 lb

## 2024-06-05 DIAGNOSIS — K449 Diaphragmatic hernia without obstruction or gangrene: Secondary | ICD-10-CM | POA: Diagnosis not present

## 2024-06-05 DIAGNOSIS — R9389 Abnormal findings on diagnostic imaging of other specified body structures: Secondary | ICD-10-CM | POA: Diagnosis not present

## 2024-06-05 DIAGNOSIS — R0989 Other specified symptoms and signs involving the circulatory and respiratory systems: Secondary | ICD-10-CM | POA: Diagnosis not present

## 2024-06-05 DIAGNOSIS — R059 Cough, unspecified: Secondary | ICD-10-CM

## 2024-06-05 DIAGNOSIS — I10 Essential (primary) hypertension: Secondary | ICD-10-CM

## 2024-06-05 LAB — POCT INFLUENZA A/B
Influenza A, POC: NEGATIVE
Influenza B, POC: NEGATIVE

## 2024-06-05 LAB — POC COVID19 BINAXNOW: SARS Coronavirus 2 Ag: NEGATIVE

## 2024-06-05 MED ORDER — PREDNISONE 10 MG PO TABS: ORAL_TABLET | ORAL | 0 refills | Status: DC

## 2024-06-05 MED ORDER — CEFDINIR 300 MG PO CAPS
300.0000 mg | ORAL_CAPSULE | Freq: Two times a day (BID) | ORAL | 0 refills | Status: DC
Start: 2024-06-05 — End: 2024-08-04

## 2024-06-05 NOTE — Progress Notes (Signed)
 Subjective:    [name], female    DOB: [birth year], 89 y.o.   MRN: [id number]  Patient here for  Chief Complaint  Patient presents with   Cough   Nasal Congestion    HPI Here for work in appt. Work in with concerns regarding increased cough and congestion. Reports symptoms started apprxoimately 05/30/24 - 05/31/24. Noticed increased head congestion. Increased nasal congestion. Increased cough. Reports having coughing fits. Productive yellow mucus. Throat irritated from coughing. No vomiting or diarrhea. Decreased po intake. No sob. Husband sick. No known exposure to covid or flu.    Past Medical History:  Diagnosis Date   Arthritis    lower back, hands   Benign esophageal stricture    Complication of anesthesia    Diverticulosis    Diverticulosis    Dysphagia    Family history of adverse reaction to anesthesia    brother had a hard time waking up with hernia surgery as a child.   GERD (gastroesophageal reflux disease)    Headache(784.0)    h/o recurrent vascular headaches. no longer gets these   Hypercholesterolemia    Hypertension    Hypothyroidism    IBS (irritable bowel syndrome)    Leg swelling    Neck fullness 2016   dr. glendia reviewed and found it to be nothing.  prinivil seemed to cause face and neck and tongue swelling   PONV (postoperative nausea and vomiting)    Transient global amnesia    x1, over 25 yrs ago after migraine   Urinary tract bacterial infections    Vertigo    x1 - several yrs ago.  OK after Epley maneuver   Past Surgical History:  Procedure Laterality Date   ABDOMINAL HYSTERECTOMY     APPENDECTOMY     BREAST BIOPSY Left 03/09/2023   Left Stereo X clip-  BENIGN BREAST TISSUE WITH FOCAL DUCT DILATION AND FEATURES OF RUPTURE WITH ASSOCIATED CALCIFICATIONS, FOCAL APOCRINE METAPLASIA of the LEFT breast, upper outer quadrant,   BREAST BIOPSY Left 03/09/2023   MM LT BREAST BX W LOC DEV 1ST LESION IMAGE BX SPEC STEREO GUIDE  03/09/2023 [facility]   CATARACT EXTRACTION W/PHACO Right 10/12/2017   Procedure: CATARACT EXTRACTION PHACO AND INTRAOCULAR LENS PLACEMENT (IOC) RIGHT;  Surgeon: Mittie Gaskin, MD;  Location: [facility];  Service: Ophthalmology;  Laterality: Right;   CATARACT EXTRACTION W/PHACO Left 11/09/2017   Procedure: CATARACT EXTRACTION PHACO AND INTRAOCULAR LENS PLACEMENT (IOC) LEFT toric;  Surgeon: Mittie Gaskin, MD;  Location: [facility];  Service: Ophthalmology;  Laterality: Left;   CHOLECYSTECTOMY  09/20/1988   CYSTOSCOPY     x2   ESOPHAGOGASTRODUODENOSCOPY (EGD) WITH PROPOFOL  N/A 08/28/2015   Procedure: ESOPHAGOGASTRODUODENOSCOPY (EGD) WITH PROPOFOL ;  Surgeon: Deward CINDERELLA Piedmont, MD;  Location: [facility];  Service: Gastroenterology;  Laterality: N/A;   ESOPHAGOGASTRODUODENOSCOPY (EGD) WITH PROPOFOL  N/A 04/04/2017   Procedure: ESOPHAGOGASTRODUODENOSCOPY (EGD) WITH PROPOFOL ;  Surgeon: Gaylyn Gladis PENNER, MD;  Location: [facility];  Service: Endoscopy;  Laterality: N/A;   PARTIAL HYSTERECTOMY     PARTIAL KNEE ARTHROPLASTY Left 04/15/2016   Procedure: UNICOMPARTMENTAL KNEE;  Surgeon: Norleen JINNY Maltos, MD;  Location: [facility];  Service: Orthopedics;  Laterality: Left;   TONSILLECTOMY     Family History  Problem Relation Age of Onset   Heart disease Father    Heart disease Mother    Heart disease Brother    Heart disease Brother    Breast cancer Neg Hx    Colon cancer  Neg Hx    Social History   Socioeconomic History   Marital status: Married    Spouse name: Not on file   Number of children: 2   Years of education: Not on file   Highest education level: Not on file  Occupational History   Occupation: retired  Tobacco Use   Smoking status: Never   Smokeless tobacco: Never  Vaping Use   Vaping status: Never Used  Substance and Sexual Activity   Alcohol use: No    Alcohol/week: 0.0 standard drinks of alcohol    Comment: seldom, a glass of wine 1-3 per  year.   Drug use: No   Sexual activity: Not Currently  Other Topics Concern   Not on file  Social History Narrative   Married   Social Drivers of Health   Financial Resource Strain: Low Risk  (05/18/2024)   Received from [facility]   Overall Financial Resource Strain (CARDIA)    Difficulty of Paying Living Expenses: Not hard at all  Food Insecurity: No Food Insecurity (05/18/2024)   Received from [facility]   Hunger Vital Sign    Within the past 12 months, you worried that your food would run out before you got the money to buy more.: Never true    Within the past 12 months, the food you bought just didn't last and you didn't have money to get more.: Never true  Transportation Needs: No Transportation Needs (05/18/2024)   Received from [facility]    In the past 12 months, has lack of transportation kept you from medical appointments or from getting medications?: No    Lack of Transportation (Non-Medical): No  Physical Activity: Inactive (08/03/2023)   Exercise Vital Sign    Days of Exercise per Week: 0 days    Minutes of Exercise per Session: 0 min  Stress: No Stress Concern Present (08/03/2023)   [employer] of Occupational Health - Occupational Stress Questionnaire    Feeling of Stress : Only a little  Social Connections: Socially Integrated (08/03/2023)   Social Connection and Isolation Panel    Frequency of Communication with Friends and Family: More than three times a week    Frequency of Social Gatherings with Friends and Family: More than three times a week    Attends Religious Services: More than 4 times per year    Active Member of [employer] or Organizations: Yes    Attends Engineer, structural: More than 4 times per year    Marital Status: Married     Review of Systems  Constitutional:  Positive for appetite change. Negative for fever.  HENT:  Positive for congestion and  postnasal drip.        Irritated throat.   Respiratory:  Positive for cough. Negative for chest tightness and shortness of breath.   Cardiovascular:  Negative for chest pain and palpitations.       No increased swelling.   Gastrointestinal:  Negative for abdominal pain, diarrhea, nausea and vomiting.       Decreased po intake.   Genitourinary:  Negative for difficulty urinating and dysuria.  Musculoskeletal:  Negative for joint swelling and myalgias.  Skin:  Negative for color change and rash.  Neurological:  Negative for dizziness and headaches.  Psychiatric/Behavioral:  Negative for agitation and dysphoric mood.        Objective:     BP 130/70   Pulse 86   Resp  16   Ht 5' 2 (1.575 m)   Wt 142 lb 12.8 oz (64.8 kg)   SpO2 97%   BMI 26.12 kg/m  Wt Readings from Last 3 Encounters:  06/05/24 142 lb 12.8 oz (64.8 kg)  12/28/23 149 lb 12.8 oz (67.9 kg)  08/26/23 151 lb 6.4 oz (68.7 kg)    Physical Exam Vitals reviewed.  Constitutional:      General: She is not in acute distress.    Appearance: Normal appearance.  HENT:     Head: Normocephalic and atraumatic.     Right Ear: External ear normal.     Left Ear: External ear normal.  Eyes:     General: No scleral icterus.       Right eye: No discharge.        Left eye: No discharge.     Conjunctiva/sclera: Conjunctivae normal.  Neck:     Thyroid : No thyromegaly.  Cardiovascular:     Rate and Rhythm: Normal rate and regular rhythm.  Pulmonary:     Effort: No respiratory distress.     Breath sounds: Normal breath sounds. No wheezing.  Abdominal:     General: Bowel sounds are normal.     Palpations: Abdomen is soft.     Tenderness: There is no abdominal tenderness.  Musculoskeletal:        General: No swelling or tenderness.     Cervical back: Neck supple. No tenderness.  Lymphadenopathy:     Cervical: No cervical adenopathy.  Skin:    Findings: No erythema or rash.  Neurological:     Mental Status: She is alert.   Psychiatric:        Mood and Affect: Mood normal.        Behavior: Behavior normal.         Outpatient Encounter Medications as of 06/05/2024  Medication Sig   cefdinir  (OMNICEF ) 300 MG capsule Take 1 capsule (300 mg total) by mouth 2 (two) times daily.   predniSONE  (DELTASONE ) 10 MG tablet Take 4 tablets x 1 day and then decrease by 1/2 tablet per day until down to zero mg.   amitriptyline  (ELAVIL ) 25 MG tablet TAKE 1 TABLET BY MOUTH EVERYDAY AT BEDTIME   amLODipine  (NORVASC ) 10 MG tablet Take 1 tablet (10 mg total) by mouth daily.   aspirin  81 MG tablet Take 81 mg by mouth daily.   atorvastatin  (LIPITOR) 40 MG tablet TAKE 1 TABLET DAILY   Calcium  Carbonate-Vitamin D  600-400 MG-UNIT tablet Take 1 tablet by mouth daily.   Cholecalciferol  (VITAMIN D3) 1000 units CAPS Take by mouth.   hydrALAZINE  (APRESOLINE ) 50 MG tablet TAKE 1 TABLET TWICE A DAY   levothyroxine  (SYNTHROID ) 75 MCG tablet Take 1 tablet (75 mcg total) by mouth daily before breakfast.   metoprolol  succinate (TOPROL -XL) 50 MG 24 hr tablet TAKE 1 TABLET TWICE A DAY  WITH OR IMMEDIATELY        FOLLOWING MEALS   pantoprazole  (PROTONIX ) 40 MG tablet Take 1 tablet (40 mg total) by mouth daily.   No facility-administered encounter medications on file as of 06/05/2024.     Lab Results  Component Value Date   WBC 7.2 04/22/2023   HGB 12.3 04/22/2023   HCT 38.0 04/22/2023   PLT 192.0 04/22/2023   GLUCOSE 112 (H) 12/23/2023   CHOL 144 12/23/2023   TRIG 137.0 12/23/2023   HDL 47.90 12/23/2023   LDLDIRECT 64.9 12/08/2012   LDLCALC 68 12/23/2023   ALT 12 12/23/2023   AST  17 12/23/2023   NA 138 12/23/2023   K 4.3 12/23/2023   CL 103 12/23/2023   CREATININE 1.38 (H) 12/23/2023   BUN 28 (H) 12/23/2023   CO2 26 12/23/2023   TSH 3.12 04/22/2023   INR 1.03 04/07/2016   HGBA1C 5.7 12/23/2023    MM 3D SCREENING MAMMOGRAM BILATERAL BREAST Result Date: 05/25/2024 CLINICAL DATA:  Screening. EXAM: DIGITAL SCREENING BILATERAL  MAMMOGRAM WITH TOMOSYNTHESIS AND CAD TECHNIQUE: Bilateral screening digital craniocaudal and mediolateral oblique mammograms were obtained. Bilateral screening digital breast tomosynthesis was performed. The images were evaluated with computer-aided detection. COMPARISON:  Previous exam(s). ACR Breast Density Category b: There are scattered areas of fibroglandular density. FINDINGS: There are no findings suspicious for malignancy. IMPRESSION: No mammographic evidence of malignancy. A result letter of this screening mammogram will be mailed directly to the patient. RECOMMENDATION: Screening mammogram in one year. (Code:SM-B-01Y) BI-RADS CATEGORY  1: Negative. Electronically Signed   By: Alm Parkins M.D.   On: 05/25/2024 10:52       Assessment & Plan:  Cough, unspecified type Assessment & Plan: Increased cough and congestion as outlined. No sob. Coughing fits. Covid and flu swab negative. Will check cxr. Treat with omnicef  bid. Also, given increased cough and coughing fits, will treat with prednisone  taper as directed. Robitussin DM and saline nasal spray and steroid nasal spray as directed. Rest. Fluids. Follow.  Call with update.   Orders: -     POC COVID-19 BinaxNow -     POCT Influenza A/B -     DG Chest 2 View; Future  Primary hypertension Assessment & Plan: Blood pressure as outlined. Continue amlodipine , metoprolol  and hydralazine .  Continue to follow pressures.    Other orders -     predniSONE ; Take 4 tablets x 1 day and then decrease by 1/2 tablet per day until down to zero mg.  Dispense: 18 tablet; Refill: 0 -     Cefdinir ; Take 1 capsule (300 mg total) by mouth 2 (two) times daily.  Dispense: 14 capsule; Refill: 0     Allena Hamilton, MD

## 2024-06-05 NOTE — Patient Instructions (Signed)
 Nasacort  nasal spray - 2 sprays each nostril one time per day.   Robitussin twice a day as needed for cough and congestion.

## 2024-06-06 ENCOUNTER — Ambulatory Visit: Payer: Self-pay | Admitting: Internal Medicine

## 2024-06-09 ENCOUNTER — Encounter: Payer: Self-pay | Admitting: Internal Medicine

## 2024-06-09 DIAGNOSIS — R059 Cough, unspecified: Secondary | ICD-10-CM | POA: Insufficient documentation

## 2024-06-09 NOTE — Assessment & Plan Note (Signed)
 Increased cough and congestion as outlined. No sob. Coughing fits. Covid and flu swab negative. Will check cxr. Treat with omnicef  bid. Also, given increased cough and coughing fits, will treat with prednisone  taper as directed. Robitussin DM and saline nasal spray and steroid nasal spray as directed. Rest. Fluids. Follow.  Call with update.

## 2024-06-09 NOTE — Assessment & Plan Note (Signed)
 Blood pressure as outlined. Continue amlodipine , metoprolol  and hydralazine .  Continue to follow pressures.

## 2024-06-12 ENCOUNTER — Ambulatory Visit: Payer: Self-pay

## 2024-06-12 NOTE — Telephone Encounter (Signed)
 Please confirm if she has taken pepto bismol. This can make her stool dark.  If not, concern regarding possible blood. Confirm no abdominal pain, acid reflux. She was recently on antibiotics. Needs to start a probiotic daily if not taking. Continue bland foods. Will need to be seen if persistent or if acute issues as outlined.

## 2024-06-12 NOTE — Telephone Encounter (Signed)
 Attempted to reach patient. Phone line busy.

## 2024-06-12 NOTE — Telephone Encounter (Signed)
 FYI Only or Action Required?: FYI only for provider.  Patient was last seen in primary care on 06/05/2024 by Glendia Shad, MD.  Called Nurse Triage reporting Diarrhea.  Symptoms began yesterday.  Interventions attempted: Prescription medications: last dose Abx  last night .  Symptoms are: unchanged.  Triage Disposition: See Physician Within 24 Hours  Patient/caregiver understands and will follow disposition?:      Copied from CRM #[id number]. Topic: Clinical - Red Word Triage >> Jun 12, 2024 11:41 AM Elizabeth Henderson wrote: Red Word that prompted transfer to Nurse Triage: Black diarrhea,nausea Reason for Disposition  [1] Recent antibiotic therapy (i.e., within last 2 months) AND [2] diarrhea present > 3 days since antibiotic was stopped  Answer Assessment - Initial Assessment Questions Advised appt- pt stated will call back in am if worsening. Advised pt that black stools can indicate possible blood loss and needs to be seen irregardless. Pt's husband suffered a stroke and is being transferred for PT- pt unable to make appt at this time        1. DIARRHEA SEVERITY: How bad is the diarrhea? How many more stools have you had in the past 24 hours than normal?      Mild  2. ONSET: When did the diarrhea begin?      Late yesterday afternoon during the night  3. STOOL DESCRIPTION:  How loose or watery is the diarrhea? What is the stool color? Is there any blood or mucous in the stool?     Black in color  4. VOMITING: Are you also vomiting? If Yes, ask: How many times in the past 24 hours?      no 5. ABDOMEN PAIN: Are you having any abdomen pain? If Yes, ask: What does it feel like? (e.g., crampy, dull, intermittent, constant)      no 6. ABDOMEN PAIN SEVERITY: If present, ask: How bad is the pain?  (e.g., Scale 1-10; mild, moderate, or severe)     no 7. ORAL INTAKE: If vomiting, Have you been able to drink liquids? How much liquids have you had in the past 24  hours?     So far today has not took in any fluids due to nausea  8. HYDRATION: Any signs of dehydration? (e.g., dry mouth [not just dry lips], too weak to stand, dizziness, new weight loss) When did you last urinate?     Mouth stays dry is not drinking water so far today  10. ANTIBIOTIC USE: Are you taking antibiotics now or have you taken antibiotics in the past 2 months?       In the past 2 months finished yesterday  11. OTHER SYMPTOMS: Do you have any other symptoms? (e.g., fever, blood in stool)       Indigestion, couldn't eat supper, nausea, cough  Protocols used: Diarrhea-A-AH

## 2024-06-12 NOTE — Telephone Encounter (Signed)
 No pepto bismol, no belly pain, no acid reflux. She is going to start a probiotic and continue bland foods. Agreed to be seen if this occurs again. Did not feel like she needs to be seen at this time.

## 2024-06-12 NOTE — Telephone Encounter (Signed)
 Patient says that she had 3 loose black stools. She has not had any black or loose stools today since she got up. The last time it was loose was 3 AM. She has no appetite. No belly pain, nausea, fever, chills, etc. She is trying to eat bland foods (toast, saltine crackers, apple sauce) and drink water. So far has been able to tolerate. Seems to be better this afternoon. She says she does not want to be seen anywhere because she does not think is it is necessary. She says she has been under a lot of stress with her husband recently having a stroke and him still being in the hospital. She has not noticed any bright red blood. Denies any other acute symptoms. Advised if her symptoms return- she has to be evaluated to confirm nothing acute going on with her.

## 2024-06-13 ENCOUNTER — Ambulatory Visit: Payer: Self-pay

## 2024-06-13 NOTE — Telephone Encounter (Signed)
 FYI Only or Action Required?: FYI only for provider.  Patient was last seen in primary care on 06/05/2024 by Glendia Shad, MD.  Called Nurse Triage reporting Diarrhea.  Symptoms began yesterday.  Interventions attempted: Rest, hydration, or home remedies.  Symptoms are: gradually improving.  Triage Disposition: Home  Patient/caregiver understands and will follow disposition?: yes    Copied from CRM #[id number]. Topic: Clinical - Red Word Triage >> Jun 12, 2024 11:41 AM Burnard DEL wrote: Red Word that prompted transfer to Nurse Triage: Black diarrhea,nausea >> Jun 13, 2024 11:46 AM Rea BROCKS wrote: Patient called back and is requesting to speak with a nurse. States still having the diarrhea and it's still loose. Requesting if she should take something for it.  Reason for Disposition . [1] MILD diarrhea AND [2] taking antibiotics    No diarrhea  Answer Assessment - Initial Assessment Questions Pt called to update she is feeling better no appt wanted     1. DIARRHEA SEVERITY: How bad is the diarrhea? How many more stools have you had in the past 24 hours than normal?      Semi formed   3. STOOL DESCRIPTION:  How loose or watery is the diarrhea? What is the stool color? Is there any blood or mucous in the stool?     Semi formed brown/no 4. VOMITING: Are you also vomiting? If Yes, ask: How many times in the past 24 hours?      no 5. ABDOMEN PAIN: Are you having any abdomen pain? If Yes, ask: What does it feel like? (e.g., crampy, dull, intermittent, constant)      no 6. ABDOMEN PAIN SEVERITY: If present, ask: How bad is the pain?  (e.g., Scale 1-10; mild, moderate, or severe)     0/10  7. ORAL INTAKE: If vomiting, Have you been able to drink liquids? How much liquids have you had in the past 24 hours?     NA 8. HYDRATION: Any signs of dehydration? (e.g., dry mouth [not just dry lips], too weak to stand, dizziness, new weight loss) When did you last  urinate?     Weak  10. ANTIBIOTIC USE: Are you taking antibiotics now or have you taken antibiotics in the past 2 months?       yes 11. OTHER SYMPTOMS: Do you have any other symptoms? (e.g., fever, blood in stool)       weakness  Protocols used: Diarrhea-A-AH

## 2024-06-13 NOTE — Telephone Encounter (Signed)
 Patient was just calling to let me know that she is doing better today- no diarrhea

## 2024-06-20 ENCOUNTER — Telehealth: Payer: Self-pay | Admitting: Internal Medicine

## 2024-06-20 NOTE — Telephone Encounter (Signed)
 Left message:  Please call the office to reschedule your 07/09/2024 appointment with Dr Glendia to another day.  E2C2 please reschedule appt

## 2024-07-02 ENCOUNTER — Other Ambulatory Visit

## 2024-07-09 ENCOUNTER — Ambulatory Visit: Admitting: Internal Medicine

## 2024-08-03 ENCOUNTER — Other Ambulatory Visit (INDEPENDENT_AMBULATORY_CARE_PROVIDER_SITE_OTHER)

## 2024-08-03 DIAGNOSIS — R739 Hyperglycemia, unspecified: Secondary | ICD-10-CM

## 2024-08-03 DIAGNOSIS — I10 Essential (primary) hypertension: Secondary | ICD-10-CM

## 2024-08-03 DIAGNOSIS — E7800 Pure hypercholesterolemia, unspecified: Secondary | ICD-10-CM | POA: Diagnosis not present

## 2024-08-03 LAB — CBC WITH DIFFERENTIAL/PLATELET
Basophils Absolute: 0.1 K/uL (ref 0.0–0.1)
Basophils Relative: 1.3 % (ref 0.0–3.0)
Eosinophils Absolute: 0.2 K/uL (ref 0.0–0.7)
Eosinophils Relative: 3.6 % (ref 0.0–5.0)
HCT: 37.1 % (ref 36.0–46.0)
Hemoglobin: 12.5 g/dL (ref 12.0–15.0)
Lymphocytes Relative: 10.8 % — ABNORMAL LOW (ref 12.0–46.0)
Lymphs Abs: 0.7 K/uL (ref 0.7–4.0)
MCHC: 33.7 g/dL (ref 30.0–36.0)
MCV: 91.5 fl (ref 78.0–100.0)
Monocytes Absolute: 0.5 K/uL (ref 0.1–1.0)
Monocytes Relative: 7.5 % (ref 3.0–12.0)
Neutro Abs: 5.1 K/uL (ref 1.4–7.7)
Neutrophils Relative %: 76.8 % (ref 43.0–77.0)
Platelets: 173 K/uL (ref 150.0–400.0)
RBC: 4.06 Mil/uL (ref 3.87–5.11)
RDW: 13.5 % (ref 11.5–15.5)
WBC: 6.6 K/uL (ref 4.0–10.5)

## 2024-08-03 LAB — BASIC METABOLIC PANEL WITH GFR
BUN: 18 mg/dL (ref 6–23)
CO2: 26 meq/L (ref 19–32)
Calcium: 9.6 mg/dL (ref 8.4–10.5)
Chloride: 104 meq/L (ref 96–112)
Creatinine, Ser: 1.18 mg/dL (ref 0.40–1.20)
GFR: 40.8 mL/min — ABNORMAL LOW (ref >60.00)
Glucose, Bld: 103 mg/dL — ABNORMAL HIGH (ref 70–99)
Potassium: 3.9 meq/L (ref 3.5–5.1)
Sodium: 139 meq/L (ref 135–145)

## 2024-08-03 LAB — HEPATIC FUNCTION PANEL
ALT: 9 U/L (ref 0–35)
AST: 19 U/L (ref 0–37)
Albumin: 4 g/dL (ref 3.5–5.2)
Alkaline Phosphatase: 92 U/L (ref 39–117)
Bilirubin, Direct: 0.1 mg/dL (ref 0.0–0.3)
Total Bilirubin: 0.6 mg/dL (ref 0.2–1.2)
Total Protein: 7 g/dL (ref 6.0–8.3)

## 2024-08-03 LAB — LIPID PANEL
Cholesterol: 144 mg/dL (ref 0–200)
HDL: 50.6 mg/dL (ref >39.00)
LDL Cholesterol: 70 mg/dL (ref 0–99)
NonHDL: 93.68
Total CHOL/HDL Ratio: 3
Triglycerides: 118 mg/dL (ref 0.0–149.0)
VLDL: 23.6 mg/dL (ref 0.0–40.0)

## 2024-08-03 LAB — TSH: TSH: 1.76 u[IU]/mL (ref 0.35–5.50)

## 2024-08-04 ENCOUNTER — Ambulatory Visit
Admission: EM | Admit: 2024-08-04 | Discharge: 2024-08-04 | Disposition: A | Discharge status: Home / Self Care | Attending: Emergency Medicine | Admitting: Emergency Medicine

## 2024-08-04 DIAGNOSIS — N3001 Acute cystitis with hematuria: Secondary | ICD-10-CM | POA: Insufficient documentation

## 2024-08-04 LAB — POCT URINE DIPSTICK
Glucose, UA: NEGATIVE mg/dL
Nitrite, UA: NEGATIVE
POC PROTEIN,UA: >=300 — AB
Spec Grav, UA: >=1.03 — AB (ref 1.010–1.025)
Urobilinogen, UA: 1 U/dL
pH, UA: 6 (ref 5.0–8.0)

## 2024-08-04 MED ORDER — CEPHALEXIN 500 MG PO CAPS: 500.0000 mg | ORAL_CAPSULE | Freq: Two times a day (BID) | ORAL | 0 refills | Status: AC

## 2024-08-04 NOTE — ED Provider Notes (Signed)
 CAY RALPH PELT    CSN: [id number] Arrival date & time: 08/04/24  1232      History   Chief Complaint Chief Complaint  Patient presents with   Urinary Frequency   Dysuria    HPI Elizabeth Henderson is a 89 y.o. female.  Patient presents with dysuria and urinary frequency since this morning.  No fever, abdominal pain, hematuria, flank pain.  No OTC medications taken.  Patient's medical history includes CKD with most recent GFR 33.96 on 12/23/2023.  The history is provided by the patient and medical records.    Past Medical History:  Diagnosis Date   Arthritis    lower back, hands   Benign esophageal stricture    Complication of anesthesia    Diverticulosis    Diverticulosis    Dysphagia    Family history of adverse reaction to anesthesia    brother had a hard time waking up with hernia surgery as a child.   GERD (gastroesophageal reflux disease)    Headache(784.0)    h/o recurrent vascular headaches. no longer gets these   Hypercholesterolemia    Hypertension    Hypothyroidism    IBS (irritable bowel syndrome)    Leg swelling    Neck fullness 2016   dr. glendia reviewed and found it to be nothing.  prinivil seemed to cause face and neck and tongue swelling   PONV (postoperative nausea and vomiting)    Transient global amnesia    x1, over 25 yrs ago after migraine   Urinary tract bacterial infections    Vertigo    x1 - several yrs ago.  OK after Epley maneuver    Patient Active Problem List   Diagnosis Date Noted   Cough 06/09/2024   Fall 08/28/2023   UTI (urinary tract infection) 08/22/2022   Nasal lesion 08/22/2022   Chest pain 09/09/2021   Hyponatremia 09/09/2021   Carotid artery disease 05/27/2021   Aortic atherosclerosis 05/11/2021   Constipation 09/01/2020   Breast cancer screening 05/12/2020   Hypertensive renal disease 04/15/2020   CKD (chronic kidney disease), stage III (HCC) 12/30/2018   Black stools 02/11/2018   Nonspecific abnormal finding  in stool contents 02/11/2018   Status post left partial knee replacement 04/15/2016   Other specified postprocedural states 04/15/2016   Primary osteoarthritis of left knee 03/29/2016   Left knee pain 12/29/2015   Thyroid  nodule 08/31/2015   Dysphagia 07/20/2015   Hypothyroidism 12/28/2014   Health care maintenance 12/28/2014   Asymptomatic menopausal state 08/09/2014   Pelvic and perineal pain 08/09/2014   Right carotid bruit 06/20/2014   Hyperglycemia 12/21/2013   Back pain 12/21/2013   Leg swelling 02/18/2013   Hypercholesterolemia 08/11/2012   Hypertension 08/11/2012   IBS (irritable bowel syndrome) 08/11/2012   GERD (gastroesophageal reflux disease) 08/11/2012    Past Surgical History:  Procedure Laterality Date   ABDOMINAL HYSTERECTOMY     APPENDECTOMY     BREAST BIOPSY Left 03/09/2023   Left Stereo X clip-  BENIGN BREAST TISSUE WITH FOCAL DUCT DILATION AND FEATURES OF RUPTURE WITH ASSOCIATED CALCIFICATIONS, FOCAL APOCRINE METAPLASIA of the LEFT breast, upper outer quadrant,   BREAST BIOPSY Left 03/09/2023   MM LT BREAST BX W LOC DEV 1ST LESION IMAGE BX SPEC STEREO GUIDE 03/09/2023 [facility]   CATARACT EXTRACTION W/PHACO Right 10/12/2017   Procedure: CATARACT EXTRACTION PHACO AND INTRAOCULAR LENS PLACEMENT (IOC) RIGHT;  Surgeon: Mittie Gaskin, MD;  Location: [facility];  Service: Ophthalmology;  Laterality: Right;   CATARACT  EXTRACTION W/PHACO Left 11/09/2017   Procedure: CATARACT EXTRACTION PHACO AND INTRAOCULAR LENS PLACEMENT (IOC) LEFT toric;  Surgeon: Mittie Gaskin, MD;  Location: [facility];  Service: Ophthalmology;  Laterality: Left;   CHOLECYSTECTOMY  09/20/1988   CYSTOSCOPY     x2   ESOPHAGOGASTRODUODENOSCOPY (EGD) WITH PROPOFOL  N/A 08/28/2015   Procedure: ESOPHAGOGASTRODUODENOSCOPY (EGD) WITH PROPOFOL ;  Surgeon: Deward CINDERELLA Piedmont, MD;  Location: [facility] ENDOS[facility];  Service: Gastroenterology;  Laterality: N/A;   ESOPHAGOGASTRODUODENOSCOPY  (EGD) WITH PROPOFOL  N/A 04/04/2017   Procedure: ESOPHAGOGASTRODUODENOSCOPY (EGD) WITH PROPOFOL ;  Surgeon: Gaylyn Gladis PENNER, MD;  Location: [facility] END[facility];  Service: Endoscopy;  Laterality: N/A;   PARTIAL HYSTERECTOMY     PARTIAL KNEE ARTHROPLASTY Left 04/15/2016   Procedure: UNICOMPARTMENTAL KNEE;  Surgeon: Norleen JINNY Maltos, MD;  Location: [facility] OR[facility];  Service: Orthopedics;  Laterality: Left;   TONSILLECTOMY      OB History   No obstetric history on file.      Home Medications    Prior to Admission medications   Medication Sig Start Date End Date Taking? Authorizing Provider  cephALEXin  (KEFLEX ) 500 MG capsule Take 1 capsule (500 mg total) by mouth 2 (two) times daily for 5 days. 08/04/24 08/09/24 Yes Corlis Burnard DEL, NP  amitriptyline  (ELAVIL ) 25 MG tablet TAKE 1 TABLET BY MOUTH EVERYDAY AT BEDTIME 03/14/24   Glendia Shad, MD  amLODipine  (NORVASC ) 10 MG tablet Take 1 tablet (10 mg total) by mouth daily. 12/28/23   Glendia Shad, MD  aspirin  81 MG tablet Take 81 mg by mouth daily.    [provider]  atorvastatin  (LIPITOR) 40 MG tablet TAKE 1 TABLET DAILY 01/16/24   Glendia Shad, MD  Calcium  Carbonate-Vitamin D  600-400 MG-UNIT tablet Take 1 tablet by mouth daily.    [provider]  Cholecalciferol  (VITAMIN D3) 1000 units CAPS Take by mouth.    [provider]  hydrALAZINE  (APRESOLINE ) 50 MG tablet TAKE 1 TABLET TWICE A DAY 01/16/24   Glendia Shad, MD  levothyroxine  (SYNTHROID ) 75 MCG tablet Take 1 tablet (75 mcg total) by mouth daily before breakfast. 12/28/23   Glendia Shad, MD  metoprolol  succinate (TOPROL -XL) 50 MG 24 hr tablet TAKE 1 TABLET TWICE A DAY  WITH OR IMMEDIATELY        FOLLOWING MEALS 12/28/23   Glendia Shad, MD  pantoprazole  (PROTONIX ) 40 MG tablet Take 1 tablet (40 mg total) by mouth daily. 08/26/23   Glendia Shad, MD  predniSONE  (DELTASONE ) 10 MG tablet Take 4 tablets x 1 day and then decrease by 1/2 tablet per day until down to zero mg.  06/05/24   Glendia Shad, MD    Family History Family History  Problem Relation Age of Onset   Heart disease Father    Heart disease Mother    Heart disease Brother    Heart disease Brother    Breast cancer Neg Hx    Colon cancer Neg Hx     Social History Social History   Tobacco Use   Smoking status: Never   Smokeless tobacco: Never  Vaping Use   Vaping status: Never Used  Substance Use Topics   Alcohol use: No    Alcohol/week: 0.0 standard drinks of alcohol    Comment: seldom, a glass of wine 1-3 per year.   Drug use: No     Allergies   Ace inhibitors, Prinivil [lisinopril], Rofecoxib, Flaxseed (linseed), Mercury, Ofloxacin, and Sulfa antibiotics   Review of Systems Review of Systems  Constitutional:  Negative for chills and  fever.  Gastrointestinal:  Negative for abdominal pain.  Genitourinary:  Positive for dysuria and frequency. Negative for flank pain and hematuria.     Physical Exam Triage Vital Signs ED Triage Vitals  Encounter Vitals Group     BP 08/04/24 1242 136/73     Girls Systolic BP Percentile --      Girls Diastolic BP Percentile --      Boys Systolic BP Percentile --      Boys Diastolic BP Percentile --      Pulse Rate 08/04/24 1242 84     Resp 08/04/24 1242 18     Temp 08/04/24 1242 98 F (36.7 C)     Temp src --      SpO2 08/04/24 1242 97 %     Weight --      Height --      Head Circumference --      Peak Flow --      Pain Score 08/04/24 1248 0     Pain Loc --      Pain Education --      Exclude from Growth Chart --    No data found.  Updated Vital Signs BP 136/73   Pulse 84   Temp 98 F (36.7 C)   Resp 18   SpO2 97%   Visual Acuity Right Eye Distance:   Left Eye Distance:   Bilateral Distance:    Right Eye Near:   Left Eye Near:    Bilateral Near:     Physical Exam Constitutional:      General: She is not in acute distress. HENT:     Mouth/Throat:     Mouth: Mucous membranes are moist.  Cardiovascular:      Rate and Rhythm: Normal rate and regular rhythm.     Heart sounds: Normal heart sounds.  Pulmonary:     Effort: Pulmonary effort is normal. No respiratory distress.     Breath sounds: Normal breath sounds.  Abdominal:     General: Bowel sounds are normal.     Palpations: Abdomen is soft.     Tenderness: There is no abdominal tenderness. There is no right CVA tenderness, left CVA tenderness, guarding or rebound.  Neurological:     Mental Status: She is alert.      UC Treatments / Results  Labs (all labs ordered are listed, but only abnormal results are displayed) Labs Reviewed  POCT URINE DIPSTICK - Abnormal; Notable for the following components:      Result Value   Clarity, UA cloudy (*)    Bilirubin, UA moderate (*)    Ketones, POC UA small (15) (*)    Spec Grav, UA >=1.030 (*)    Blood, UA large (*)    POC PROTEIN,UA >=300 (*)    Leukocytes, UA Large (3+) (*)    All other components within normal limits  URINE CULTURE    EKG   Radiology No results found.  Procedures Procedures (including critical care time)  Medications Ordered in UC Medications - No data to display  Initial Impression / Assessment and Plan / UC Course  I have reviewed the triage vital signs and the nursing notes.  Pertinent labs & imaging results that were available during my care of the patient were reviewed by me and considered in my medical decision making (see chart for details).    Acute cystitis with hematuria.  Afebrile and vital signs are stable.  Treating today with cephalexin  (renal dosing).  Urine  culture pending.  Discussed with patient that we will call her if the culture shows need to change or discontinue the antibiotic.  Instructed her to follow-up with her PCP.  Education provided on UTI.  She agrees to plan of care.  Final Clinical Impressions(s) / UC Diagnoses   Final diagnoses:  Acute cystitis with hematuria     Discharge Instructions      Take the antibiotic as  directed.  The urine culture is pending.  We will call you if it shows the need to change or discontinue your antibiotic.    Follow up with your primary care provider.        ED Prescriptions     Medication Sig Dispense Auth. Provider   cephALEXin  (KEFLEX ) 500 MG capsule Take 1 capsule (500 mg total) by mouth 2 (two) times daily for 5 days. 10 capsule Corlis Burnard DEL, NP      PDMP not reviewed this encounter.   Corlis Burnard DEL, NP 08/04/24 [phone]

## 2024-08-04 NOTE — ED Triage Notes (Signed)
 Patient to Urgent Care with complaints of  urinary frequency/ burning.   Symptoms started this morning.

## 2024-08-04 NOTE — Discharge Instructions (Signed)
Take the antibiotic as directed.  The urine culture is pending.  We will call you if it shows the need to change or discontinue your antibiotic.    Follow up with your primary care provider.    

## 2024-08-06 ENCOUNTER — Telehealth: Payer: Self-pay | Admitting: *Deleted

## 2024-08-06 ENCOUNTER — Telehealth: Payer: Self-pay | Admitting: Emergency Medicine

## 2024-08-06 ENCOUNTER — Ambulatory Visit (HOSPITAL_COMMUNITY): Payer: Self-pay

## 2024-08-06 LAB — URINE CULTURE: Culture: 80000 — AB

## 2024-08-06 LAB — HEMOGLOBIN A1C: Hgb A1c MFr Bld: 5.7 % (ref 4.6–6.5)

## 2024-08-06 MED ORDER — NITROFURANTOIN MONOHYD MACRO 100 MG PO CAPS: 100.0000 mg | ORAL_CAPSULE | Freq: Two times a day (BID) | ORAL | 0 refills | Status: DC

## 2024-08-06 NOTE — Telephone Encounter (Signed)
 Pt went to Urgent Care

## 2024-08-06 NOTE — Telephone Encounter (Signed)
 Patient notified in clinic that she is intolerable to cephalexin , calls nausea and she has discontinued medicine, did help with urinary symptoms, prescribed Macrobid as has intolerance to sulfa and fluoroquinolones, discussed with patient that susceptibility report has not resulted and there may be additional changes to her medicine, verbalized understanding

## 2024-08-07 ENCOUNTER — Ambulatory Visit: Payer: Self-pay | Admitting: Internal Medicine

## 2024-08-10 ENCOUNTER — Encounter: Payer: Self-pay | Admitting: Internal Medicine

## 2024-08-10 ENCOUNTER — Ambulatory Visit: Admitting: Internal Medicine

## 2024-08-10 VITALS — BP 130/60 | HR 99 | Temp 97.8°F | Ht 62.0 in | Wt 129.5 lb

## 2024-08-10 DIAGNOSIS — M25562 Pain in left knee: Secondary | ICD-10-CM

## 2024-08-10 DIAGNOSIS — E7800 Pure hypercholesterolemia, unspecified: Secondary | ICD-10-CM

## 2024-08-10 DIAGNOSIS — R739 Hyperglycemia, unspecified: Secondary | ICD-10-CM | POA: Diagnosis not present

## 2024-08-10 DIAGNOSIS — N1832 Chronic kidney disease, stage 3b: Secondary | ICD-10-CM

## 2024-08-10 DIAGNOSIS — K219 Gastro-esophageal reflux disease without esophagitis: Secondary | ICD-10-CM | POA: Diagnosis not present

## 2024-08-10 DIAGNOSIS — R2689 Other abnormalities of gait and mobility: Secondary | ICD-10-CM | POA: Diagnosis not present

## 2024-08-10 DIAGNOSIS — R634 Abnormal weight loss: Secondary | ICD-10-CM

## 2024-08-10 DIAGNOSIS — I10 Essential (primary) hypertension: Secondary | ICD-10-CM | POA: Diagnosis not present

## 2024-08-10 DIAGNOSIS — E039 Hypothyroidism, unspecified: Secondary | ICD-10-CM | POA: Diagnosis not present

## 2024-08-10 DIAGNOSIS — N3000 Acute cystitis without hematuria: Secondary | ICD-10-CM

## 2024-08-10 MED ORDER — ONDANSETRON HCL 4 MG PO TABS: 4.0000 mg | ORAL_TABLET | Freq: Two times a day (BID) | ORAL | 0 refills | Status: AC | PRN

## 2024-08-10 MED ORDER — PANTOPRAZOLE SODIUM 40 MG PO TBEC: 40.0000 mg | DELAYED_RELEASE_TABLET | Freq: Every day | ORAL | 3 refills | Status: AC

## 2024-08-10 MED ORDER — ATORVASTATIN CALCIUM 40 MG PO TABS: 40.0000 mg | ORAL_TABLET | Freq: Every day | ORAL | 3 refills | Status: AC

## 2024-08-10 MED ORDER — METOPROLOL SUCCINATE ER 50 MG PO TB24: ORAL_TABLET | ORAL | 1 refills | Status: DC

## 2024-08-10 MED ORDER — LEVOTHYROXINE SODIUM 75 MCG PO TABS: 75.0000 ug | ORAL_TABLET | Freq: Every day | ORAL | 1 refills | Status: DC

## 2024-08-10 MED ORDER — AMITRIPTYLINE HCL 25 MG PO TABS: 25.0000 mg | ORAL_TABLET | Freq: Every day | ORAL | 1 refills | Status: DC

## 2024-08-10 MED ORDER — HYDRALAZINE HCL 50 MG PO TABS: 50.0000 mg | ORAL_TABLET | Freq: Two times a day (BID) | ORAL | 3 refills | Status: AC

## 2024-08-10 MED ORDER — AMLODIPINE BESYLATE 10 MG PO TABS: 10.0000 mg | ORAL_TABLET | Freq: Every day | ORAL | 1 refills | Status: DC

## 2024-08-10 NOTE — Progress Notes (Unsigned)
 Subjective:    [name], female    DOB: June 23, 1932, 89 y.o.   MRN: [id number]  Patient here for  Chief Complaint  Patient presents with  . Medical Management of Chronic Issues    4 mth f/u & review labs    HPI Here for a scheduled follow up - here to follow up regarding her blood pressure, CKD and cholesterol. Had f/u Dr Dennise - CKD - GFR 33. Saw ortho 01/13/24 - left knee pain. CT scan of the knee which showed no evidence for occult fractures or obvious loosening of the components. F/u 03/02/24 - s/p injection. Helped. F/u 05/18/24 - recommended gel injections.  Recent UTI - 08/04/24. Treated with keflex  and then macrobid .    Past Medical History:  Diagnosis Date  . Arthritis    lower back, hands  . Benign esophageal stricture   . Complication of anesthesia   . Diverticulosis   . Diverticulosis   . Dysphagia   . Family history of adverse reaction to anesthesia    brother had a hard time waking up with hernia surgery as a child.  SABRA GERD (gastroesophageal reflux disease)   . Headache(784.0)    h/o recurrent vascular headaches. no longer gets these  . Hypercholesterolemia   . Hypertension   . Hypothyroidism   . IBS (irritable bowel syndrome)   . Leg swelling   . Neck fullness 2016   dr. glendia reviewed and found it to be nothing.  prinivil seemed to cause face and neck and tongue swelling  . PONV (postoperative nausea and vomiting)   . Transient global amnesia    x1, over 25 yrs ago after migraine  . Urinary tract bacterial infections   . Vertigo    x1 - several yrs ago.  OK after Epley maneuver   Past Surgical History:  Procedure Laterality Date  . ABDOMINAL HYSTERECTOMY    . APPENDECTOMY    . BREAST BIOPSY Left 03/09/2023   Left Stereo X clip-  BENIGN BREAST TISSUE WITH FOCAL DUCT DILATION AND FEATURES OF RUPTURE WITH ASSOCIATED CALCIFICATIONS, FOCAL APOCRINE METAPLASIA of the LEFT breast, upper outer quadrant,  . BREAST BIOPSY Left 03/09/2023   MM  LT BREAST BX W LOC DEV 1ST LESION IMAGE BX SPEC STEREO GUIDE 03/09/2023 [facility]  . CATARACT EXTRACTION W/PHACO Right 10/12/2017   Procedure: CATARACT EXTRACTION PHACO AND INTRAOCULAR LENS PLACEMENT (IOC) RIGHT;  Surgeon: Mittie Gaskin, MD;  Location: [facility];  Service: Ophthalmology;  Laterality: Right;  . CATARACT EXTRACTION W/PHACO Left 11/09/2017   Procedure: CATARACT EXTRACTION PHACO AND INTRAOCULAR LENS PLACEMENT (IOC) LEFT toric;  Surgeon: Mittie Gaskin, MD;  Location: [facility];  Service: Ophthalmology;  Laterality: Left;  . CHOLECYSTECTOMY  09/20/1988  . CYSTOSCOPY     x2  . ESOPHAGOGASTRODUODENOSCOPY (EGD) WITH PROPOFOL  N/A 08/28/2015   Procedure: ESOPHAGOGASTRODUODENOSCOPY (EGD) WITH PROPOFOL ;  Surgeon: Deward CINDERELLA Piedmont, MD;  Location: [facility] ENDOSCOPY;  Service: Gastroenterology;  Laterality: N/A;  . ESOPHAGOGASTRODUODENOSCOPY (EGD) WITH PROPOFOL  N/A 04/04/2017   Procedure: ESOPHAGOGASTRODUODENOSCOPY (EGD) WITH PROPOFOL ;  Surgeon: Gaylyn Gladis PENNER, MD;  Location: [facility] END[facility];  Service: Endoscopy;  Laterality: N/A;  . PARTIAL HYSTERECTOMY    . PARTIAL KNEE ARTHROPLASTY Left 04/15/2016   Procedure: UNICOMPARTMENTAL KNEE;  Surgeon: Norleen JINNY Maltos, MD;  Location: [facility];  Service: Orthopedics;  Laterality: Left;  . TONSILLECTOMY     Family History  Problem Relation Age of Onset  . Heart disease Father   . Heart disease  Mother   . Heart disease Brother   . Heart disease Brother   . Breast cancer Neg Hx   . Colon cancer Neg Hx    Social History   Socioeconomic History  . Marital status: Married    Spouse name: Not on file  . Number of children: 2  . Years of education: Not on file  . Highest education level: Not on file  Occupational History  . Occupation: retired  Tobacco Use  . Smoking status: Never  . Smokeless tobacco: Never  Vaping Use  . Vaping status: Never Used  Substance and Sexual Activity  . Alcohol use: No     Alcohol/week: 0.0 standard drinks of alcohol    Comment: seldom, a glass of wine 1-3 per year.  . Drug use: No  . Sexual activity: Not Currently  Other Topics Concern  . Not on file  Social History Narrative   Married   Social Drivers of Health   Financial Resource Strain: Low Risk  (05/18/2024)   Received from [facility]   Overall Financial Resource Strain (CARDIA)   . Difficulty of Paying Living Expenses: Not hard at all  Food Insecurity: No Food Insecurity (05/18/2024)   Received from [facility]   Hunger Vital Sign   . Within the past 12 months, you worried that your food would run out before you got the money to buy more.: Never true   . Within the past 12 months, the food you bought just didn't last and you didn't have money to get more.: Never true  Transportation Needs: No Transportation Needs (05/18/2024)   Received from [facility]   [facility] - Transportation   . In the past 12 months, has lack of transportation kept you from medical appointments or from getting medications?: No   . Lack of Transportation (Non-Medical): No  Physical Activity: Inactive (08/03/2023)   Exercise Vital Sign   . Days of Exercise per Week: 0 days   . Minutes of Exercise per Session: 0 min  Stress: No Stress Concern Present (08/03/2023)   [employer] - Occupational Stress Questionnaire   . Feeling of Stress : Only a little  Social Connections: Socially Integrated (08/03/2023)   Social Connection and Isolation Panel   . Frequency of Communication with Friends and Family: More than three times a week   . Frequency of Social Gatherings with Friends and Family: More than three times a week   . Attends Religious Services: More than 4 times per year   . Active Member of Clubs or Organizations: Yes   . Attends Banker Meetings: More than 4 times per year   . Marital Status: Married     Review of  Systems     Objective:     BP 130/60   Pulse 99   Temp 97.8 F (36.6 C) (Oral)   Ht 5' 2 (1.575 m)   Wt 129 lb 8 oz (58.7 kg)   SpO2 97%   BMI 23.69 kg/m  Wt Readings from Last 3 Encounters:  08/10/24 129 lb 8 oz (58.7 kg)  06/05/24 142 lb 12.8 oz (64.8 kg)  12/28/23 149 lb 12.8 oz (67.9 kg)    Physical Exam  {Perform Simple Foot Exam  Perform Detailed exam:1} {Insert foot Exam (Optional):30965}   Outpatient Encounter Medications as of 08/10/2024  Medication Sig  . amitriptyline  (ELAVIL ) 25 MG tablet TAKE 1 TABLET BY MOUTH EVERYDAY AT BEDTIME  .  amLODipine  (NORVASC ) 10 MG tablet Take 1 tablet (10 mg total) by mouth daily.  . aspirin  81 MG tablet Take 81 mg by mouth daily.  . atorvastatin  (LIPITOR) 40 MG tablet TAKE 1 TABLET DAILY  . Calcium  Carbonate-Vitamin D  600-400 MG-UNIT tablet Take 1 tablet by mouth daily.  . Cholecalciferol  (VITAMIN D3) 1000 units CAPS Take by mouth.  . hydrALAZINE  (APRESOLINE ) 50 MG tablet TAKE 1 TABLET TWICE A DAY  . levothyroxine  (SYNTHROID ) 75 MCG tablet Take 1 tablet (75 mcg total) by mouth daily before breakfast.  . metoprolol  succinate (TOPROL -XL) 50 MG 24 hr tablet TAKE 1 TABLET TWICE A DAY  WITH OR IMMEDIATELY        FOLLOWING MEALS  . pantoprazole  (PROTONIX ) 40 MG tablet Take 1 tablet (40 mg total) by mouth daily.  . [EXPIRED] cephALEXin  (KEFLEX ) 500 MG capsule Take 1 capsule (500 mg total) by mouth 2 (two) times daily for 5 days.  . [DISCONTINUED] nitrofurantoin , macrocrystal-monohydrate, (MACROBID ) 100 MG capsule Take 1 capsule (100 mg total) by mouth 2 (two) times daily. (Patient not taking: Reported on 08/10/2024)  . [DISCONTINUED] predniSONE  (DELTASONE ) 10 MG tablet Take 4 tablets x 1 day and then decrease by 1/2 tablet per day until down to zero mg.   No facility-administered encounter medications on file as of 08/10/2024.     Lab Results  Component Value Date   WBC 6.6 08/03/2024   HGB 12.5 08/03/2024   HCT 37.1 08/03/2024    PLT 173.0 08/03/2024   GLUCOSE 103 (H) 08/03/2024   CHOL 144 08/03/2024   TRIG 118.0 08/03/2024   HDL 50.60 08/03/2024   LDLDIRECT 64.9 12/08/2012   LDLCALC 70 08/03/2024   ALT 9 08/03/2024   AST 19 08/03/2024   NA 139 08/03/2024   K 3.9 08/03/2024   CL 104 08/03/2024   CREATININE 1.18 08/03/2024   BUN 18 08/03/2024   CO2 26 08/03/2024   TSH 1.76 08/03/2024   INR 1.03 04/07/2016   HGBA1C 5.7 08/03/2024    No results found.     Assessment & Plan:  Hypercholesterolemia  Hyperglycemia  Primary hypertension     Allena Hamilton, MD

## 2024-08-12 ENCOUNTER — Encounter: Payer: Self-pay | Admitting: Internal Medicine

## 2024-08-12 ENCOUNTER — Telehealth: Payer: Self-pay | Admitting: Internal Medicine

## 2024-08-12 DIAGNOSIS — R2689 Other abnormalities of gait and mobility: Secondary | ICD-10-CM | POA: Insufficient documentation

## 2024-08-12 DIAGNOSIS — R634 Abnormal weight loss: Secondary | ICD-10-CM | POA: Insufficient documentation

## 2024-08-12 NOTE — Telephone Encounter (Signed)
 Please notify - I printed rx for walker. Forgot to give to her during Ms Eunice's appt.  We can mail, fax or they can pick up - whatever is more convenient. Sorry for the inconvenience.

## 2024-08-12 NOTE — Assessment & Plan Note (Signed)
 Continue on Lipitor.  Low-cholesterol diet and exercise.  Discussed recent labs.  Lab Results  Component Value Date   CHOL 144 08/03/2024   HDL 50.60 08/03/2024   LDLCALC 70 08/03/2024   LDLDIRECT 64.9 12/08/2012   TRIG 118.0 08/03/2024   CHOLHDL 3 08/03/2024

## 2024-08-12 NOTE — Assessment & Plan Note (Signed)
 Unable to take ACE inhibitor secondary to reaction (swelling).  Continue to avoid anti-inflammatories.  Blood pressure as outlined.  Continue current medication regimen as outlined.  Follow metabolic panel.  Continue follow-up with nephrology. Recent GFR improved when compared to our recent checks - 40. Follow.

## 2024-08-12 NOTE — Assessment & Plan Note (Signed)
 Weight loss as outlined. Discussed. Increased stress. No other acute symptoms currently. Recent UTI and GI symptoms related to abx. Follow. Encourage increased po intake. Follow closely. Call with update. Monitor weight.

## 2024-08-12 NOTE — Assessment & Plan Note (Signed)
 Continues on protonix . Will continue for now. Plans to discuss taper in future.

## 2024-08-12 NOTE — Assessment & Plan Note (Signed)
 Recent UTI. Discussed. Intolerance to macrobid  and keflex . No urinary symptoms now. With no symptoms, will hold on further abx. No need to check urine if no urinary symptoms. Follow.

## 2024-08-12 NOTE — Assessment & Plan Note (Signed)
 Blood pressure as outlined. Continue amlodipine , metoprolol  and hydralazine .  No change in medication. Follow pressures. Follow metabolic panel.

## 2024-08-12 NOTE — Assessment & Plan Note (Signed)
 Concerns regarding balance issues as outlined. Using a cane now. Discussed walker. Rx for walker. Discussed PT. Wants to hold at this time. Follow. Notify me if desires PT.

## 2024-08-12 NOTE — Assessment & Plan Note (Signed)
 Seeing ortho. Planning gel injections. Follow.

## 2024-08-12 NOTE — Assessment & Plan Note (Signed)
 On thyroid replacement.  Follow tsh.

## 2024-08-12 NOTE — Assessment & Plan Note (Signed)
 Follow met b and A1c.

## 2024-08-14 NOTE — Telephone Encounter (Signed)
 Mailed to patient

## 2024-08-22 DIAGNOSIS — M25562 Pain in left knee: Secondary | ICD-10-CM | POA: Diagnosis not present

## 2024-08-22 DIAGNOSIS — M1712 Unilateral primary osteoarthritis, left knee: Secondary | ICD-10-CM | POA: Diagnosis not present

## 2024-08-22 DIAGNOSIS — Z96652 Presence of left artificial knee joint: Secondary | ICD-10-CM | POA: Diagnosis not present

## 2024-08-29 DIAGNOSIS — Z96652 Presence of left artificial knee joint: Secondary | ICD-10-CM | POA: Diagnosis not present

## 2024-08-29 DIAGNOSIS — M25562 Pain in left knee: Secondary | ICD-10-CM | POA: Diagnosis not present

## 2024-08-29 DIAGNOSIS — G8929 Other chronic pain: Secondary | ICD-10-CM | POA: Diagnosis not present

## 2024-08-29 DIAGNOSIS — M1712 Unilateral primary osteoarthritis, left knee: Secondary | ICD-10-CM | POA: Diagnosis not present

## 2024-10-01 ENCOUNTER — Ambulatory Visit: Admitting: Internal Medicine

## 2024-10-01 ENCOUNTER — Encounter: Payer: Self-pay | Admitting: Internal Medicine

## 2024-10-01 VITALS — BP 120/70 | HR 72 | Temp 98.1°F | Ht 62.0 in | Wt 126.6 lb

## 2024-10-01 DIAGNOSIS — K219 Gastro-esophageal reflux disease without esophagitis: Secondary | ICD-10-CM | POA: Diagnosis not present

## 2024-10-01 DIAGNOSIS — R739 Hyperglycemia, unspecified: Secondary | ICD-10-CM

## 2024-10-01 DIAGNOSIS — R634 Abnormal weight loss: Secondary | ICD-10-CM

## 2024-10-01 DIAGNOSIS — F439 Reaction to severe stress, unspecified: Secondary | ICD-10-CM

## 2024-10-01 DIAGNOSIS — I10 Essential (primary) hypertension: Secondary | ICD-10-CM

## 2024-10-01 DIAGNOSIS — E7800 Pure hypercholesterolemia, unspecified: Secondary | ICD-10-CM | POA: Diagnosis not present

## 2024-10-01 DIAGNOSIS — E039 Hypothyroidism, unspecified: Secondary | ICD-10-CM | POA: Diagnosis not present

## 2024-10-01 DIAGNOSIS — M7989 Other specified soft tissue disorders: Secondary | ICD-10-CM

## 2024-10-01 DIAGNOSIS — N1832 Chronic kidney disease, stage 3b: Secondary | ICD-10-CM | POA: Diagnosis not present

## 2024-10-01 DIAGNOSIS — M1712 Unilateral primary osteoarthritis, left knee: Secondary | ICD-10-CM | POA: Diagnosis not present

## 2024-10-01 MED ORDER — AMLODIPINE BESYLATE 10 MG PO TABS: 10.0000 mg | ORAL_TABLET | Freq: Every day | ORAL | 1 refills | Status: AC

## 2024-10-01 MED ORDER — METOPROLOL SUCCINATE ER 50 MG PO TB24: ORAL_TABLET | ORAL | 1 refills | Status: AC

## 2024-10-01 MED ORDER — AMITRIPTYLINE HCL 25 MG PO TABS: 25.0000 mg | ORAL_TABLET | Freq: Every day | ORAL | 1 refills | Status: AC

## 2024-10-01 MED ORDER — LEVOTHYROXINE SODIUM 75 MCG PO TABS: 75.0000 ug | ORAL_TABLET | Freq: Every day | ORAL | 1 refills | Status: AC

## 2024-10-01 NOTE — Progress Notes (Signed)
 "  Subjective:    [name], female    DOB: 04-Oct-1931, 89 y.o.   MRN: [id number]  Patient here for  Chief Complaint  Patient presents with   Medical Management of Chronic Issues    HPI Here for a scheduled follow up - here to follow up regarding her blood pressure, CKD and cholesterol. Had f/u Dr Dennise - CKD - GFR 33. Saw ortho 01/13/24 - left knee pain. CT scan of the knee which showed no evidence for occult fractures or obvious loosening of the components. F/u 03/02/24 - s/p injection. Helped. F/u 05/18/24 - recommended gel injections. Increased stress. Discussed stress related to her husband's health issues. He is doing better. Has good support. Breathing stable. No abdominal pain or bowel change reported.    Past Medical History:  Diagnosis Date   Arthritis    lower back, hands   Benign esophageal stricture    Complication of anesthesia    Diverticulosis    Diverticulosis    Dysphagia    Family history of adverse reaction to anesthesia    brother had a hard time waking up with hernia surgery as a child.   GERD (gastroesophageal reflux disease)    Headache(784.0)    h/o recurrent vascular headaches. no longer gets these   Hypercholesterolemia    Hypertension    Hypothyroidism    IBS (irritable bowel syndrome)    Leg swelling    Neck fullness 2016   dr. glendia reviewed and found it to be nothing.  prinivil seemed to cause face and neck and tongue swelling   PONV (postoperative nausea and vomiting)    Transient global amnesia    x1, over 25 yrs ago after migraine   Urinary tract bacterial infections    Vertigo    x1 - several yrs ago.  OK after Epley maneuver   Past Surgical History:  Procedure Laterality Date   ABDOMINAL HYSTERECTOMY     APPENDECTOMY     BREAST BIOPSY Left 03/09/2023   Left Stereo X clip-  BENIGN BREAST TISSUE WITH FOCAL DUCT DILATION AND FEATURES OF RUPTURE WITH ASSOCIATED CALCIFICATIONS, FOCAL APOCRINE METAPLASIA of the LEFT breast,  upper outer quadrant,   BREAST BIOPSY Left 03/09/2023   MM LT BREAST BX W LOC DEV 1ST LESION IMAGE BX SPEC STEREO GUIDE 03/09/2023 [facility]   CATARACT EXTRACTION W/PHACO Right 10/12/2017   Procedure: CATARACT EXTRACTION PHACO AND INTRAOCULAR LENS PLACEMENT (IOC) RIGHT;  Surgeon: Mittie Gaskin, MD;  Location: [facility];  Service: Ophthalmology;  Laterality: Right;   CATARACT EXTRACTION W/PHACO Left 11/09/2017   Procedure: CATARACT EXTRACTION PHACO AND INTRAOCULAR LENS PLACEMENT (IOC) LEFT toric;  Surgeon: Mittie Gaskin, MD;  Location: [facility];  Service: Ophthalmology;  Laterality: Left;   CHOLECYSTECTOMY  09/20/1988   CYSTOSCOPY     x2   ESOPHAGOGASTRODUODENOSCOPY (EGD) WITH PROPOFOL  N/A 08/28/2015   Procedure: ESOPHAGOGASTRODUODENOSCOPY (EGD) WITH PROPOFOL ;  Surgeon: Deward CINDERELLA Piedmont, MD;  Location: [facility];  Service: Gastroenterology;  Laterality: N/A;   ESOPHAGOGASTRODUODENOSCOPY (EGD) WITH PROPOFOL  N/A 04/04/2017   Procedure: ESOPHAGOGASTRODUODENOSCOPY (EGD) WITH PROPOFOL ;  Surgeon: Gaylyn Gladis PENNER, MD;  Location: [facility];  Service: Endoscopy;  Laterality: N/A;   PARTIAL HYSTERECTOMY     PARTIAL KNEE ARTHROPLASTY Left 04/15/2016   Procedure: UNICOMPARTMENTAL KNEE;  Surgeon: Norleen JINNY Maltos, MD;  Location: [facility] OR[facility];  Service: Orthopedics;  Laterality: Left;   TONSILLECTOMY     Family History  Problem Relation Age of Onset   Heart  disease Father    Heart disease Mother    Heart disease Brother    Heart disease Brother    Breast cancer Neg Hx    Colon cancer Neg Hx    Social History   Socioeconomic History   Marital status: Married    Spouse name: Not on file   Number of children: 2   Years of education: Not on file   Highest education level: Not on file  Occupational History   Occupation: retired  Tobacco Use   Smoking status: Never   Smokeless tobacco: Never  Vaping Use   Vaping status: Never Used  Substance and Sexual  Activity   Alcohol use: No    Alcohol/week: 0.0 standard drinks of alcohol    Comment: seldom, a glass of wine 1-3 per year.   Drug use: No   Sexual activity: Not Currently  Other Topics Concern   Not on file  Social History Narrative   Married   Social Drivers of Health   Tobacco Use: Low Risk (10/07/2024)   Patient History    Smoking Tobacco Use: Never    Smokeless Tobacco Use: Never    Passive Exposure: Not on file  Recent Concern: Tobacco Use - Medium Risk (09/05/2024)   Received from [facility]   Patient History    Smoking Tobacco Use: Former    Smokeless Tobacco Use: Never    Passive Exposure: Not on file  Financial Resource Strain: Low Risk  (08/22/2024)   Received from [facility]   Overall Financial Resource Strain (CARDIA)    Difficulty of Paying Living Expenses: Not hard at all  Food Insecurity: No Food Insecurity (08/22/2024)   Received from [facility]   Epic    Within the past 12 months, you worried that your food would run out before you got the money to buy more.: Never true    Within the past 12 months, the food you bought just didn't last and you didn't have money to get more.: Never true  Transportation Needs: No Transportation Needs (08/22/2024)   Received from [facility] - Transportation    In the past 12 months, has lack of transportation kept you from medical appointments or from getting medications?: No    Lack of Transportation (Non-Medical): No  Physical Activity: Inactive (08/03/2023)   Exercise Vital Sign    Days of Exercise per Week: 0 days    Minutes of Exercise per Session: 0 min  Stress: No Stress Concern Present (08/03/2023)   [employer] - Occupational Stress Questionnaire    Feeling of Stress : Only a little  Social Connections: Socially Integrated (08/03/2023)   Social Connection and Isolation Panel    Frequency of  Communication with Friends and Family: More than three times a week    Frequency of Social Gatherings with Friends and Family: More than three times a week    Attends Religious Services: More than 4 times per year    Active Member of Clubs or Organizations: Yes    Attends Banker Meetings: More than 4 times per year    Marital Status: Married  Depression (PHQ2-9): Medium Risk (08/10/2024)   Depression (PHQ2-9)    PHQ-2 Score: 6  Alcohol Screen: Low Risk (08/03/2023)   Alcohol Screen    Last Alcohol Screening Score (AUDIT): 1  Housing: Low Risk  (08/29/2024)   Received from [facility]  Epic    In the last 12 months, was there a time when you were not able to pay the mortgage or rent on time?: No    In the past 12 months, how many times have you moved where you were living?: 0    At any time in the past 12 months, were you homeless or living in a shelter (including now)?: No  Utilities: Not At Risk (08/22/2024)   Received from [facility]   Epic    In the past 12 months has the electric, gas, oil, or water company threatened to shut off services in your home?: No  Health Literacy: Adequate Health Literacy (08/03/2023)   B1300 Health Literacy    Frequency of need for help with medical instructions: Never     Review of Systems  Constitutional:  Negative for appetite change and unexpected weight change.  HENT:  Negative for congestion and sinus pressure.   Respiratory:  Negative for cough and chest tightness.        Breathing stable.   Cardiovascular:  Negative for chest pain and palpitations.       No increased swelling. Improved.   Gastrointestinal:  Negative for abdominal pain, diarrhea, nausea and vomiting.  Genitourinary:  Negative for difficulty urinating and dysuria.  Musculoskeletal:  Negative for joint swelling and myalgias.  Skin:  Negative for color change and rash.  Neurological:  Negative for dizziness and  light-headedness.  Psychiatric/Behavioral:  Negative for agitation and dysphoric mood.        Objective:     BP 120/70   Pulse 72   Temp 98.1 F (36.7 C) (Oral)   Ht 5' 2 (1.575 m)   Wt 126 lb 9.6 oz (57.4 kg)   SpO2 97%   BMI 23.16 kg/m  Wt Readings from Last 3 Encounters:  10/01/24 126 lb 9.6 oz (57.4 kg)  08/10/24 129 lb 8 oz (58.7 kg)  06/05/24 142 lb 12.8 oz (64.8 kg)    Physical Exam Vitals reviewed.  Constitutional:      General: She is not in acute distress.    Appearance: Normal appearance.  HENT:     Head: Normocephalic and atraumatic.     Right Ear: External ear normal.     Left Ear: External ear normal.     Mouth/Throat:     Pharynx: No oropharyngeal exudate or posterior oropharyngeal erythema.  Eyes:     General: No scleral icterus.       Right eye: No discharge.        Left eye: No discharge.     Conjunctiva/sclera: Conjunctivae normal.  Neck:     Thyroid : No thyromegaly.  Cardiovascular:     Rate and Rhythm: Normal rate and regular rhythm.  Pulmonary:     Effort: No respiratory distress.     Breath sounds: Normal breath sounds. No wheezing.  Abdominal:     General: Bowel sounds are normal.     Palpations: Abdomen is soft.     Tenderness: There is no abdominal tenderness.  Musculoskeletal:        General: No tenderness.     Cervical back: Neck supple. No tenderness.     Comments: No increased swelling.   Lymphadenopathy:     Cervical: No cervical adenopathy.  Skin:    Findings: No erythema or rash.  Neurological:     Mental Status: She is alert.  Psychiatric:        Mood and Affect: Mood normal.  Behavior: Behavior normal.         Outpatient Encounter Medications as of 10/01/2024  Medication Sig   aspirin  81 MG tablet Take 81 mg by mouth daily.   atorvastatin  (LIPITOR) 40 MG tablet Take 1 tablet (40 mg total) by mouth daily.   Calcium  Carbonate-Vitamin D  600-400 MG-UNIT tablet Take 1 tablet by mouth daily.   Cholecalciferol   (VITAMIN D3) 1000 units CAPS Take by mouth.   hydrALAZINE  (APRESOLINE ) 50 MG tablet Take 1 tablet (50 mg total) by mouth 2 (two) times daily.   ondansetron  (ZOFRAN ) 4 MG tablet Take 1 tablet (4 mg total) by mouth 2 (two) times daily as needed for nausea or vomiting.   pantoprazole  (PROTONIX ) 40 MG tablet Take 1 tablet (40 mg total) by mouth daily.   amitriptyline  (ELAVIL ) 25 MG tablet Take 1 tablet (25 mg total) by mouth at bedtime.   amLODipine  (NORVASC ) 10 MG tablet Take 1 tablet (10 mg total) by mouth daily.   levothyroxine  (SYNTHROID ) 75 MCG tablet Take 1 tablet (75 mcg total) by mouth daily before breakfast.   metoprolol  succinate (TOPROL -XL) 50 MG 24 hr tablet TAKE 1 TABLET TWICE A DAY  WITH OR IMMEDIATELY        FOLLOWING MEALS   [DISCONTINUED] amitriptyline  (ELAVIL ) 25 MG tablet Take 1 tablet (25 mg total) by mouth at bedtime.   [DISCONTINUED] amLODipine  (NORVASC ) 10 MG tablet Take 1 tablet (10 mg total) by mouth daily.   [DISCONTINUED] levothyroxine  (SYNTHROID ) 75 MCG tablet Take 1 tablet (75 mcg total) by mouth daily before breakfast.   [DISCONTINUED] metoprolol  succinate (TOPROL -XL) 50 MG 24 hr tablet TAKE 1 TABLET TWICE A DAY  WITH OR IMMEDIATELY        FOLLOWING MEALS   No facility-administered encounter medications on file as of 10/01/2024.     Lab Results  Component Value Date   WBC 6.6 08/03/2024   HGB 12.5 08/03/2024   HCT 37.1 08/03/2024   PLT 173.0 08/03/2024   GLUCOSE 103 (H) 08/03/2024   CHOL 144 08/03/2024   TRIG 118.0 08/03/2024   HDL 50.60 08/03/2024   LDLDIRECT 64.9 12/08/2012   LDLCALC 70 08/03/2024   ALT 9 08/03/2024   AST 19 08/03/2024   NA 139 08/03/2024   K 3.9 08/03/2024   CL 104 08/03/2024   CREATININE 1.18 08/03/2024   BUN 18 08/03/2024   CO2 26 08/03/2024   TSH 1.76 08/03/2024   INR 1.03 04/07/2016   HGBA1C 5.7 08/03/2024       Assessment & Plan:  Weight loss Assessment & Plan: Weight down a few more pounds from last check. No GI symptoms  reported. Encourage increased po intake. Follow.    Primary osteoarthritis of left knee Assessment & Plan: Saw ortho 01/13/24 - left knee pain. CT scan of the knee which showed no evidence for occult fractures or obvious loosening of the components. F/u 03/02/24 - s/p injection. Helped. F/u 05/18/24 - recommended gel injections.    Leg swelling Assessment & Plan: Improved.    Hypothyroidism, unspecified type Assessment & Plan: On thyroid  replacement. Follow tsh.    Primary hypertension Assessment & Plan: Blood pressure as outlined. Continue amlodipine , metoprolol  and hydralazine .  No change in medication. Follow pressures. Follow metabolic panel.    Hyperglycemia Assessment & Plan: Follow met b and A1c.    Hypercholesterolemia Assessment & Plan: Continue on Lipitor.  Low-cholesterol diet and exercise.  Follow lipid panel.  Lab Results  Component Value Date   CHOL 144 08/03/2024  HDL 50.60 08/03/2024   LDLCALC 70 08/03/2024   LDLDIRECT 64.9 12/08/2012   TRIG 118.0 08/03/2024   CHOLHDL 3 08/03/2024      Gastroesophageal reflux disease, unspecified whether esophagitis present Assessment & Plan: On protonix . No upper symptoms reported.    Stage 3b chronic kidney disease (HCC) Assessment & Plan: Unable to take ACE inhibitor secondary to reaction (swelling).  Continue to avoid anti-inflammatories.  Blood pressure as outlined.  Continue current medication regimen as outlined.  Follow metabolic panel.  Continue follow-up with nephrology - GFR 33..    Stress Assessment & Plan: Increased stress as outlined. Discussed. Husband is doing better. Has good support. Follow.    Other orders -     Amitriptyline  HCl; Take 1 tablet (25 mg total) by mouth at bedtime.  Dispense: 90 tablet; Refill: 1 -     amLODIPine  Besylate; Take 1 tablet (10 mg total) by mouth daily.  Dispense: 90 tablet; Refill: 1 -     Levothyroxine  Sodium; Take 1 tablet (75 mcg total) by mouth daily before  breakfast.  Dispense: 90 tablet; Refill: 1 -     Metoprolol  Succinate ER; TAKE 1 TABLET TWICE A DAY  WITH OR IMMEDIATELY        FOLLOWING MEALS  Dispense: 180 tablet; Refill: 1     Allena Hamilton, MD "

## 2024-10-07 ENCOUNTER — Encounter: Payer: Self-pay | Admitting: Internal Medicine

## 2024-10-07 DIAGNOSIS — F439 Reaction to severe stress, unspecified: Secondary | ICD-10-CM | POA: Insufficient documentation

## 2024-10-07 NOTE — Assessment & Plan Note (Signed)
 Follow met b and A1c.

## 2024-10-07 NOTE — Assessment & Plan Note (Signed)
 Increased stress as outlined. Discussed. Husband is doing better. Has good support. Follow.

## 2024-10-07 NOTE — Assessment & Plan Note (Signed)
 Blood pressure as outlined. Continue amlodipine , metoprolol  and hydralazine .  No change in medication. Follow pressures. Follow metabolic panel.

## 2024-10-07 NOTE — Assessment & Plan Note (Signed)
 Unable to take ACE inhibitor secondary to reaction (swelling).  Continue to avoid anti-inflammatories.  Blood pressure as outlined.  Continue current medication regimen as outlined.  Follow metabolic panel.  Continue follow-up with nephrology - GFR 33.SABRA

## 2024-10-07 NOTE — Assessment & Plan Note (Signed)
 Improved

## 2024-10-07 NOTE — Assessment & Plan Note (Signed)
 Continue on Lipitor.  Low-cholesterol diet and exercise.  Follow lipid panel.  Lab Results  Component Value Date   CHOL 144 08/03/2024   HDL 50.60 08/03/2024   LDLCALC 70 08/03/2024   LDLDIRECT 64.9 12/08/2012   TRIG 118.0 08/03/2024   CHOLHDL 3 08/03/2024

## 2024-10-07 NOTE — Assessment & Plan Note (Signed)
 Weight down a few more pounds from last check. No GI symptoms reported. Encourage increased po intake. Follow.

## 2024-10-07 NOTE — Assessment & Plan Note (Signed)
 On thyroid replacement.  Follow tsh.

## 2024-10-07 NOTE — Assessment & Plan Note (Signed)
 Saw ortho 01/13/24 - left knee pain. CT scan of the knee which showed no evidence for occult fractures or obvious loosening of the components. F/u 03/02/24 - s/p injection. Helped. F/u 05/18/24 - recommended gel injections.

## 2024-10-07 NOTE — Assessment & Plan Note (Signed)
On protonix.  No upper symptoms reported.   

## 2024-10-10 ENCOUNTER — Encounter: Payer: Self-pay | Admitting: Internal Medicine

## 2024-10-10 ENCOUNTER — Ambulatory Visit: Payer: Self-pay

## 2024-10-10 ENCOUNTER — Ambulatory Visit: Admitting: Internal Medicine

## 2024-10-10 ENCOUNTER — Telehealth: Payer: Self-pay

## 2024-10-10 VITALS — BP 120/70 | HR 72 | Ht 62.0 in | Wt 124.6 lb

## 2024-10-10 DIAGNOSIS — E7800 Pure hypercholesterolemia, unspecified: Secondary | ICD-10-CM | POA: Diagnosis not present

## 2024-10-10 DIAGNOSIS — N1832 Chronic kidney disease, stage 3b: Secondary | ICD-10-CM

## 2024-10-10 DIAGNOSIS — I10 Essential (primary) hypertension: Secondary | ICD-10-CM | POA: Diagnosis not present

## 2024-10-10 DIAGNOSIS — F439 Reaction to severe stress, unspecified: Secondary | ICD-10-CM | POA: Diagnosis not present

## 2024-10-10 MED ORDER — SERTRALINE HCL 25 MG PO TABS: 25.0000 mg | ORAL_TABLET | Freq: Every day | ORAL | 1 refills | Status: AC

## 2024-10-10 NOTE — Telephone Encounter (Signed)
 Pt is scheduled for 2 pm this afternoon 10/10/24

## 2024-10-10 NOTE — Progress Notes (Signed)
 "  Subjective:    [name], female    DOB: 1932-09-11, 89 y.o.   MRN: [id number]  Patient here for  Chief Complaint  Patient presents with   Anxiety    HPI Here for work in appt. Accompanied by her caretaker. History obtained from both of them. Increased stress. Stress with her husband's health issues. Discussed. Has caretakers, that will help her with ADLs and driving. Discussed.    Breathing stable. No chest pain. No abdominal pain. No increased swelling. Discussed increased stress. Feels needs something to help level things out.    Past Medical History:  Diagnosis Date   Arthritis    lower back, hands   Benign esophageal stricture    Complication of anesthesia    Diverticulosis    Diverticulosis    Dysphagia    Family history of adverse reaction to anesthesia    brother had a hard time waking up with hernia surgery as a child.   GERD (gastroesophageal reflux disease)    Headache(784.0)    h/o recurrent vascular headaches. no longer gets these   Hypercholesterolemia    Hypertension    Hypothyroidism    IBS (irritable bowel syndrome)    Leg swelling    Neck fullness 2016   dr. glendia reviewed and found it to be nothing.  prinivil seemed to cause face and neck and tongue swelling   PONV (postoperative nausea and vomiting)    Transient global amnesia    x1, over 25 yrs ago after migraine   Urinary tract bacterial infections    Vertigo    x1 - several yrs ago.  OK after Epley maneuver   Past Surgical History:  Procedure Laterality Date   ABDOMINAL HYSTERECTOMY     APPENDECTOMY     BREAST BIOPSY Left 03/09/2023   Left Stereo X clip-  BENIGN BREAST TISSUE WITH FOCAL DUCT DILATION AND FEATURES OF RUPTURE WITH ASSOCIATED CALCIFICATIONS, FOCAL APOCRINE METAPLASIA of the LEFT breast, upper outer quadrant,   BREAST BIOPSY Left 03/09/2023   MM LT BREAST BX W LOC DEV 1ST LESION IMAGE BX SPEC STEREO GUIDE 03/09/2023 [facility]   CATARACT EXTRACTION W/PHACO  Right 10/12/2017   Procedure: CATARACT EXTRACTION PHACO AND INTRAOCULAR LENS PLACEMENT (IOC) RIGHT;  Surgeon: Mittie Gaskin, MD;  Location: [facility];  Service: Ophthalmology;  Laterality: Right;   CATARACT EXTRACTION W/PHACO Left 11/09/2017   Procedure: CATARACT EXTRACTION PHACO AND INTRAOCULAR LENS PLACEMENT (IOC) LEFT toric;  Surgeon: Mittie Gaskin, MD;  Location: [facility];  Service: Ophthalmology;  Laterality: Left;   CHOLECYSTECTOMY  09/20/1988   CYSTOSCOPY     x2   ESOPHAGOGASTRODUODENOSCOPY (EGD) WITH PROPOFOL  N/A 08/28/2015   Procedure: ESOPHAGOGASTRODUODENOSCOPY (EGD) WITH PROPOFOL ;  Surgeon: Deward CINDERELLA Piedmont, MD;  Location: [facility];  Service: Gastroenterology;  Laterality: N/A;   ESOPHAGOGASTRODUODENOSCOPY (EGD) WITH PROPOFOL  N/A 04/04/2017   Procedure: ESOPHAGOGASTRODUODENOSCOPY (EGD) WITH PROPOFOL ;  Surgeon: Gaylyn Gladis PENNER, MD;  Location: [facility];  Service: Endoscopy;  Laterality: N/A;   PARTIAL HYSTERECTOMY     PARTIAL KNEE ARTHROPLASTY Left 04/15/2016   Procedure: UNICOMPARTMENTAL KNEE;  Surgeon: Norleen JINNY Maltos, MD;  Location: [facility];  Service: Orthopedics;  Laterality: Left;   TONSILLECTOMY     Family History  Problem Relation Age of Onset   Heart disease Father    Heart disease Mother    Heart disease Brother    Heart disease Brother    Breast cancer Neg Hx    Colon cancer Neg Hx  Social History   Socioeconomic History   Marital status: Married    Spouse name: Not on file   Number of children: 2   Years of education: Not on file   Highest education level: Not on file  Occupational History   Occupation: retired  Tobacco Use   Smoking status: Never   Smokeless tobacco: Never  Vaping Use   Vaping status: Never Used  Substance and Sexual Activity   Alcohol use: No    Alcohol/week: 0.0 standard drinks of alcohol    Comment: seldom, a glass of wine 1-3 per year.   Drug use: No   Sexual activity: Not Currently   Other Topics Concern   Not on file  Social History Narrative   Married   Social Drivers of Health   Tobacco Use: Low Risk (10/14/2024)   Patient History    Smoking Tobacco Use: Never    Smokeless Tobacco Use: Never    Passive Exposure: Not on file  Recent Concern: Tobacco Use - Medium Risk (09/05/2024)   Received from [facility]   Patient History    Smoking Tobacco Use: Former    Smokeless Tobacco Use: Never    Passive Exposure: Not on file  Financial Resource Strain: Low Risk  (08/22/2024)   Received from [facility]   Overall Financial Resource Strain (CARDIA)    Difficulty of Paying Living Expenses: Not hard at all  Food Insecurity: No Food Insecurity (08/22/2024)   Received from [facility]   Epic    Within the past 12 months, you worried that your food would run out before you got the money to buy more.: Never true    Within the past 12 months, the food you bought just didn't last and you didn't have money to get more.: Never true  Transportation Needs: No Transportation Needs (08/22/2024)   Received from [facility] - Transportation    In the past 12 months, has lack of transportation kept you from medical appointments or from getting medications?: No    Lack of Transportation (Non-Medical): No  Physical Activity: Inactive (08/03/2023)   Exercise Vital Sign    Days of Exercise per Week: 0 days    Minutes of Exercise per Session: 0 min  Stress: No Stress Concern Present (08/03/2023)   [employer] - Occupational Stress Questionnaire    Feeling of Stress : Only a little  Social Connections: Socially Integrated (08/03/2023)   Social Connection and Isolation Panel    Frequency of Communication with Friends and Family: More than three times a week    Frequency of Social Gatherings with Friends and Family: More than three times a week    Attends Religious Services:  More than 4 times per year    Active Member of Clubs or Organizations: Yes    Attends Banker Meetings: More than 4 times per year    Marital Status: Married  Depression (PHQ2-9): Low Risk (10/10/2024)   Depression (PHQ2-9)    PHQ-2 Score: 2  Recent Concern: Depression (PHQ2-9) - Medium Risk (08/10/2024)   Depression (PHQ2-9)    PHQ-2 Score: 6  Alcohol Screen: Low Risk (08/03/2023)   Alcohol Screen    Last Alcohol Screening Score (AUDIT): 1  Housing: Low Risk  (08/29/2024)   Received from [facility]   Epic    In the last 12 months, was there a time when you were not able  to pay the mortgage or rent on time?: No    In the past 12 months, how many times have you moved where you were living?: 0    At any time in the past 12 months, were you homeless or living in a shelter (including now)?: No  Utilities: Not At Risk (08/22/2024)   Received from [facility]   Epic    In the past 12 months has the electric, gas, oil, or water company threatened to shut off services in your home?: No  Health Literacy: Adequate Health Literacy (08/03/2023)   B1300 Health Literacy    Frequency of need for help with medical instructions: Never     Review of Systems  Constitutional:  Negative for appetite change and unexpected weight change.  HENT:  Negative for congestion and sinus pressure.   Respiratory:  Negative for cough, chest tightness and shortness of breath.   Cardiovascular:  Negative for chest pain, palpitations and leg swelling.  Gastrointestinal:  Negative for abdominal pain, diarrhea, nausea and vomiting.  Genitourinary:  Negative for difficulty urinating and dysuria.  Musculoskeletal:  Negative for joint swelling and myalgias.  Skin:  Negative for color change and rash.  Neurological:  Negative for dizziness and headaches.  Psychiatric/Behavioral:  Negative for agitation.        Increased stress as outlined.        Objective:     BP  120/70   Pulse 72   Ht 5' 2 (1.575 m)   Wt 124 lb 9.6 oz (56.5 kg)   SpO2 98%   BMI 22.79 kg/m  Wt Readings from Last 3 Encounters:  10/10/24 124 lb 9.6 oz (56.5 kg)  10/01/24 126 lb 9.6 oz (57.4 kg)  08/10/24 129 lb 8 oz (58.7 kg)    Physical Exam Vitals reviewed.  Constitutional:      General: She is not in acute distress.    Appearance: Normal appearance.  HENT:     Head: Normocephalic and atraumatic.     Right Ear: External ear normal.     Left Ear: External ear normal.     Mouth/Throat:     Pharynx: No oropharyngeal exudate or posterior oropharyngeal erythema.  Eyes:     General: No scleral icterus.       Right eye: No discharge.        Left eye: No discharge.     Conjunctiva/sclera: Conjunctivae normal.  Neck:     Thyroid : No thyromegaly.  Cardiovascular:     Rate and Rhythm: Normal rate and regular rhythm.  Pulmonary:     Effort: No respiratory distress.     Breath sounds: Normal breath sounds. No wheezing.  Abdominal:     General: Bowel sounds are normal.     Palpations: Abdomen is soft.     Tenderness: There is no abdominal tenderness.  Musculoskeletal:        General: No tenderness.     Cervical back: Neck supple. No tenderness.     Comments: No increased swelling.   Lymphadenopathy:     Cervical: No cervical adenopathy.  Skin:    Findings: No erythema or rash.  Neurological:     Mental Status: She is alert.  Psychiatric:        Mood and Affect: Mood normal.        Behavior: Behavior normal.         Outpatient Encounter Medications as of 10/10/2024  Medication Sig   amitriptyline  (ELAVIL ) 25 MG tablet Take  1 tablet (25 mg total) by mouth at bedtime.   amLODipine  (NORVASC ) 10 MG tablet Take 1 tablet (10 mg total) by mouth daily.   aspirin  81 MG tablet Take 81 mg by mouth daily.   atorvastatin  (LIPITOR) 40 MG tablet Take 1 tablet (40 mg total) by mouth daily.   Calcium  Carbonate-Vitamin D  600-400 MG-UNIT tablet Take 1 tablet by mouth daily.    Cholecalciferol  (VITAMIN D3) 1000 units CAPS Take by mouth.   hydrALAZINE  (APRESOLINE ) 50 MG tablet Take 1 tablet (50 mg total) by mouth 2 (two) times daily.   levothyroxine  (SYNTHROID ) 75 MCG tablet Take 1 tablet (75 mcg total) by mouth daily before breakfast.   metoprolol  succinate (TOPROL -XL) 50 MG 24 hr tablet TAKE 1 TABLET TWICE A DAY  WITH OR IMMEDIATELY        FOLLOWING MEALS   ondansetron  (ZOFRAN ) 4 MG tablet Take 1 tablet (4 mg total) by mouth 2 (two) times daily as needed for nausea or vomiting.   pantoprazole  (PROTONIX ) 40 MG tablet Take 1 tablet (40 mg total) by mouth daily.   sertraline  (ZOLOFT ) 25 MG tablet Take 1 tablet (25 mg total) by mouth daily.   No facility-administered encounter medications on file as of 10/10/2024.     Lab Results  Component Value Date   WBC 6.6 08/03/2024   HGB 12.5 08/03/2024   HCT 37.1 08/03/2024   PLT 173.0 08/03/2024   GLUCOSE 103 (H) 08/03/2024   CHOL 144 08/03/2024   TRIG 118.0 08/03/2024   HDL 50.60 08/03/2024   LDLDIRECT 64.9 12/08/2012   LDLCALC 70 08/03/2024   ALT 9 08/03/2024   AST 19 08/03/2024   NA 139 08/03/2024   K 3.9 08/03/2024   CL 104 08/03/2024   CREATININE 1.18 08/03/2024   BUN 18 08/03/2024   CO2 26 08/03/2024   TSH 1.76 08/03/2024   INR 1.03 04/07/2016   HGBA1C 5.7 08/03/2024       Assessment & Plan:  Stage 3b chronic kidney disease (HCC) Assessment & Plan: Unable to take ACE inhibitor secondary to reaction (swelling).  Continue to avoid anti-inflammatories.  Blood pressure as outlined.  Continue current medication regimen as outlined.  Continue follow-up with nephrology. Follow metabolic panel.    Hypercholesterolemia Assessment & Plan: Continue on Lipitor.  Low-cholesterol diet and exercise.  Follow lipid panel.  Lab Results  Component Value Date   CHOL 144 08/03/2024   HDL 50.60 08/03/2024   LDLCALC 70 08/03/2024   LDLDIRECT 64.9 12/08/2012   TRIG 118.0 08/03/2024   CHOLHDL 3 08/03/2024       Primary hypertension Assessment & Plan: Blood pressure as outlined. Continue amlodipine , metoprolol  and hydralazine .  No change in medication. Follow pressures. Follow metabolic panel.    Stress Assessment & Plan: Increased stress as outlined. Discussed. Feels needs something to help level things out. Discussed treatment options. Start zoloft  25mg  q day. Follow. Call with update.    Other orders -     Sertraline  HCl; Take 1 tablet (25 mg total) by mouth daily.  Dispense: 30 tablet; Refill: 1     Allena Hamilton, MD "

## 2024-10-10 NOTE — Telephone Encounter (Signed)
 FYI Only or Action Required?: Action required by provider: update on patient condition and please call with recommendations.  Patient was last seen in primary care on 10/01/2024 by Glendia Shad, MD.  Called Nurse Triage reporting Dementia.  Symptoms began several months ago.  Interventions attempted: Rest, hydration, or home remedies.  Symptoms are: gradually worsening.  Triage Disposition: See PCP Within 2 Weeks  Patient/caregiver understands and will follow disposition?: No, wishes to speak with PCP   Message from [facility] sent at 10/10/2024 10:13 AM EST  Reason for Triage: Patient was in to see Dr. Glendia a week ago and wasn't able to address concern mental declines, signs of dementia, getting very confused and anxious at appointment in front of mom, has someone at home a lot, but getting concerned about her driving. States she wants some advice and speak with Dr. Glendia   Reason for Disposition  New or worsening memory (forgetfulness) problems  Answer Assessment - Initial Assessment Questions Husband had stroke 3 months ago- has caregiver help in the home and lives next to family members who check in frequently.  Family has expressed concern that she is highly anxious and her memory has gotten much worse over the last few months.  - not consistently taking meds- got her a pill organizer- stressed her out more so not using- niece took her to take her meds and she stood in front of her meds for trying to figure out what the bottles said (had glasses on her but said she couldn't see without her glasses). Pointed out glasses on her face and then was fine.  - When she gets a bill- HAS To pay it yesterday- then panics while writing a check (think she has forgotten how) -husbands helpers have expressed concern over her anxiety and stress but she is not letting them help out more often  - Concerned that she shouldn't be driving- high anxiety  when having to go anywhere. Daughter  asking for guidance on how to approach taking the car away  Daughter lives in [place] and is in town every 2-3weeks- discussed making her a part of the decision to hand over the keys- presenting it as for her safety. Advised against taking the car away without her knowing as it could backfire.   Denies UTI symptoms. Eating and drinking   Daughter asking for provider or assistant to reach out with recommendations regarding memory management- was unable to discuss during appt due to not wanting to upset patient. Does feel like she would potentially benefit from anxiety/depression medication.  1. MAIN CONCERN OR SYMPTOM:  What is your main concern right now? What questions do you have? What's the main symptom you're worried about? (e.g., confusion, memory loss)     Safety- anxiety- memory 2. ONSET:  When did the symptom start (or worsen)? (minutes, hours, days, weeks)     Worse the last 3 months- but definite decline last 6 months 3. BETTER-SAME-WORSE: Are you (the patient) getting better, staying the same, or getting worse compared to the day you (they) were diagnosed or most recent hospital discharge?     worse 4. DIAGNOSIS: Was the dementia diagnosed by a doctor? If Yes, ask: When? (e.g., days, months, years ago)     denies 5. MEDICINES: Has there been any change in medicines recently? (e.g., narcotics, antihistamines, benzodiazepines, etc.)     denies 6. OTHER SYMPTOMS: Are there any other symptoms? (e.g., cough, falling, fever, pain)     anxiety 7. SUPPORT: What  type of support do you (the patient) have? Note: Document living circumstances and support (e.g., family, nursing home).     Family, and in home cargivers  Protocols used: Dementia Symptoms and Questions-A-AH

## 2024-10-10 NOTE — Telephone Encounter (Signed)
 Per our discussion, can see if wants to come in this pm as we discussed.

## 2024-10-10 NOTE — Telephone Encounter (Signed)
 Copied from CRM #[id number]. Topic: General - Call Back - No Documentation >> Oct 10, 2024  4:03 PM Viola F wrote: Reason for CRM: Patient returning call from the office but there is no documentation - please reach back out if necessary

## 2024-10-10 NOTE — Telephone Encounter (Signed)
 Voicemail was pertaining to pt's husband.

## 2024-10-14 ENCOUNTER — Encounter: Payer: Self-pay | Admitting: Internal Medicine

## 2024-10-14 NOTE — Assessment & Plan Note (Addendum)
 Unable to take ACE inhibitor secondary to reaction (swelling).  Continue to avoid anti-inflammatories.  Blood pressure as outlined.  Continue current medication regimen as outlined.  Continue follow-up with nephrology. Follow metabolic panel.

## 2024-10-14 NOTE — Assessment & Plan Note (Signed)
 Blood pressure as outlined. Continue amlodipine , metoprolol  and hydralazine .  No change in medication. Follow pressures. Follow metabolic panel.

## 2024-10-14 NOTE — Assessment & Plan Note (Signed)
 Continue on Lipitor.  Low-cholesterol diet and exercise.  Follow lipid panel.  Lab Results  Component Value Date   CHOL 144 08/03/2024   HDL 50.60 08/03/2024   LDLCALC 70 08/03/2024   LDLDIRECT 64.9 12/08/2012   TRIG 118.0 08/03/2024   CHOLHDL 3 08/03/2024

## 2024-10-14 NOTE — Assessment & Plan Note (Signed)
 Increased stress as outlined. Discussed. Feels needs something to help level things out. Discussed treatment options. Start zoloft  25mg  q day. Follow. Call with update.

## 2024-10-16 ENCOUNTER — Other Ambulatory Visit: Payer: Self-pay

## 2024-10-16 ENCOUNTER — Emergency Department
Admission: EM | Admit: 2024-10-16 | Discharge: 2024-10-16 | Disposition: A | Discharge status: Home / Self Care | Attending: Emergency Medicine | Admitting: Emergency Medicine

## 2024-10-16 DIAGNOSIS — N189 Chronic kidney disease, unspecified: Secondary | ICD-10-CM | POA: Diagnosis not present

## 2024-10-16 DIAGNOSIS — I951 Orthostatic hypotension: Secondary | ICD-10-CM

## 2024-10-16 DIAGNOSIS — W19XXXA Unspecified fall, initial encounter: Secondary | ICD-10-CM | POA: Insufficient documentation

## 2024-10-16 DIAGNOSIS — I129 Hypertensive chronic kidney disease with stage 1 through stage 4 chronic kidney disease, or unspecified chronic kidney disease: Secondary | ICD-10-CM | POA: Insufficient documentation

## 2024-10-16 DIAGNOSIS — R42 Dizziness and giddiness: Secondary | ICD-10-CM | POA: Insufficient documentation

## 2024-10-16 DIAGNOSIS — R55 Syncope and collapse: Secondary | ICD-10-CM | POA: Insufficient documentation

## 2024-10-16 LAB — URINALYSIS, ROUTINE W REFLEX MICROSCOPIC
Bilirubin Urine: NEGATIVE
Glucose, UA: NEGATIVE mg/dL
Hgb urine dipstick: NEGATIVE
Ketones, ur: NEGATIVE mg/dL
Nitrite: NEGATIVE
Protein, ur: NEGATIVE mg/dL
Specific Gravity, Urine: 1.006 (ref 1.005–1.030)
pH: 5 (ref 5.0–8.0)

## 2024-10-16 LAB — CBC WITH DIFFERENTIAL/PLATELET
Abs Immature Granulocytes: 0.03 10*3/uL (ref 0.00–0.07)
Basophils Absolute: 0 10*3/uL (ref 0.0–0.1)
Basophils Relative: 0 %
Eosinophils Absolute: 0.1 10*3/uL (ref 0.0–0.5)
Eosinophils Relative: 2 %
HCT: 40.4 % (ref 36.0–46.0)
Hemoglobin: 13.2 g/dL (ref 12.0–15.0)
Immature Granulocytes: 1 %
Lymphocytes Relative: 12 %
Lymphs Abs: 0.8 10*3/uL (ref 0.7–4.0)
MCH: 30.2 pg (ref 26.0–34.0)
MCHC: 32.7 g/dL (ref 30.0–36.0)
MCV: 92.4 fL (ref 80.0–100.0)
Monocytes Absolute: 0.4 10*3/uL (ref 0.1–1.0)
Monocytes Relative: 6 %
Neutro Abs: 4.9 10*3/uL (ref 1.7–7.7)
Neutrophils Relative %: 79 %
Platelets: 150 10*3/uL (ref 150–400)
RBC: 4.37 MIL/uL (ref 3.87–5.11)
RDW: 13 % (ref 11.5–15.5)
WBC: 6.2 10*3/uL (ref 4.0–10.5)
nRBC: 0 % (ref 0.0–0.2)

## 2024-10-16 LAB — COMPREHENSIVE METABOLIC PANEL WITH GFR
ALT: 8 U/L (ref 0–44)
AST: 19 U/L (ref 15–41)
Albumin: 4 g/dL (ref 3.5–5.0)
Alkaline Phosphatase: 107 U/L (ref 38–126)
Anion gap: 13 (ref 5–15)
BUN: 22 mg/dL (ref 8–23)
CO2: 23 mmol/L (ref 22–32)
Calcium: 9.3 mg/dL (ref 8.9–10.3)
Chloride: 104 mmol/L (ref 98–111)
Creatinine, Ser: 1.39 mg/dL — ABNORMAL HIGH (ref 0.44–1.00)
GFR, Estimated: 36 mL/min — ABNORMAL LOW
Glucose, Bld: 121 mg/dL — ABNORMAL HIGH (ref 70–99)
Potassium: 3.7 mmol/L (ref 3.5–5.1)
Sodium: 140 mmol/L (ref 135–145)
Total Bilirubin: 0.6 mg/dL (ref 0.0–1.2)
Total Protein: 6.8 g/dL (ref 6.5–8.1)

## 2024-10-16 LAB — TROPONIN T, HIGH SENSITIVITY
Troponin T High Sensitivity: 15 ng/L (ref 0–19)
Troponin T High Sensitivity: 13 ng/L (ref 0–19)

## 2024-10-16 MED ORDER — LACTATED RINGERS IV BOLUS
1000.0000 mL | Freq: Once | INTRAVENOUS | Status: AC
  Administered 2024-10-16: 1000 mL via INTRAVENOUS

## 2024-10-16 NOTE — ED Provider Notes (Signed)
 "  [facility] Provider Note    Event Date/Time   First MD Initiated Contact with Patient 10/16/24 1331     (approximate)   History   Loss of Consciousness   HPI  Elizabeth Henderson is a 89 y.o. female who presents to the ED for evaluation of Loss of Consciousness   Review of PCP visit from 6 days ago.  History of CKD, HTN, HLD.  Patient presents to the ED after an episode of dizziness and a fall.  She lives at home and lives independently, helps care for her husband who recently had a stroke last year.  Patient reports being quite stressed about caring for her husband, had not yet had anything today to drink or eat beyond 2 cups of coffee.  She went to the bathroom and had a normal void, stood up from this and felt dizzy and fell to the ground.  She reports that she was only out for a second or 2 and is not even sure if she experienced syncope.   She reports that she feels completely fine now and just wants to go back home to help care for her husband.  Granddaughter is here at the bedside and reports that she seems at her baseline.   Physical Exam   Triage Vital Signs: ED Triage Vitals  Encounter Vitals Group     BP      Girls Systolic BP Percentile      Girls Diastolic BP Percentile      Boys Systolic BP Percentile      Boys Diastolic BP Percentile      Pulse      Resp      Temp      Temp src      SpO2      Weight      Height      Head Circumference      Peak Flow      Pain Score      Pain Loc      Pain Education      Exclude from Growth Chart     Most recent vital signs: Vitals:   10/16/24 1338 10/16/24 1344  BP:  (!) 136/58  Pulse: 74 70  Resp: 16 17  Temp: (!) 97.2 F (36.2 C)   SpO2: 95% 96%    General: Awake, no distress.  Well-appearing and conversational CV:  Good peripheral perfusion.  Resp:  Normal effort.  Abd:  No distention.  Soft and nontender MSK:  No deformity noted.  Palpation of all 4 extremities without  evidence of deformity, tenderness or trauma Neuro:  No focal deficits appreciated. Cranial nerves II through XII intact 5/5 strength and sensation in all 4 extremities Other:     ED Results / Procedures / Treatments   Labs (all labs ordered are listed, but only abnormal results are displayed) Labs Reviewed  COMPREHENSIVE METABOLIC PANEL WITH GFR - Abnormal; Notable for the following components:      Result Value   Glucose, Bld 121 (*)    Creatinine, Ser 1.39 (*)    GFR, Estimated 36 (*)    All other components within normal limits  CBC WITH DIFFERENTIAL/PLATELET  URINALYSIS, ROUTINE W REFLEX MICROSCOPIC  TROPONIN T, HIGH SENSITIVITY    EKG   RADIOLOGY   Official radiology report(s): No results found.  PROCEDURES and INTERVENTIONS:  Procedures  Medications  lactated ringers  bolus 1,000 mL (1,000 mLs Intravenous New Bag/Given 10/16/24 1351)  IMPRESSION / MDM / ASSESSMENT AND PLAN / ED COURSE  I reviewed the triage vital signs and the nursing notes.  Differential diagnosis includes, but is not limited to, orthostatic syncope, cardiac dysrhythmia, dehydration, AKI, UTI, sepsis  {Patient presents with symptoms of an acute illness or injury that is potentially life-threatening.  Patient presents to the ED after an episode of orthostasis with possible syncope.  Presents to the ED asymptomatic with a normal exam and requesting discharge on my initial evaluation.  Normal CBC, metabolic panel with renal dysfunction at baseline, negative troponin.  Awaiting UA and EKG.  Suspect she will be suitable for outpatient management pending clinical course  Clinical Course as of 10/16/24 1459  Tue Oct 16, 2024  1333 Orthostatic. Syncope with PT after standing today [DS]  1432 Reassessed, discussed with daughter over the phone as well as granddaughter at the bedside [DS]    Clinical Course User Index [DS] Claudene Rover, MD     FINAL CLINICAL IMPRESSION(S) / ED DIAGNOSES    Final diagnoses:  None     Rx / DC Orders   ED Discharge Orders     None        Note:  This document was prepared using Dragon voice recognition software and may include unintentional dictation errors.   Claudene Rover, MD 10/16/24 1500  "

## 2024-10-16 NOTE — ED Triage Notes (Signed)
 Pt to ED via [facility] from home for c/o syncopal episode. Per EMS, pt was working with physical therapist, stood up, and felt cold and lightheaded. Pt then had syncopal episode. Pt lowered to floor by physical therapist. Initial BP upon standing for EMS was 73/40. Last BP with EMS was 140 systolic. Pt alert and oriented, ambulatory, no acute distress.  Hx HTN, did not take meds today.

## 2024-10-16 NOTE — ED Provider Notes (Signed)
.----------------------------------------- °  3:06 PM on 10/16/2024 -----------------------------------------  Blood pressure (!) 136/58, pulse 70, temperature (!) 97.2 F (36.2 C), resp. rate 17, height 5' 2 (1.575 m), weight 58.4 kg, SpO2 96%.  Assuming care from Dr. Claudene.  In short, Elizabeth Henderson is a 89 y.o. female with a chief complaint of lightheadedness.  Refer to the original H&P for additional details.  The current plan of care is to follow up EKG, UA, likely dc.  EKG showed sinus rhythm, PACs noted, rate 77, normal QS, normal QTc, not significantly changed compared to prior  On reassessment patient is feeling good, ambulatory without any symptoms.  Suspect syncopal episode today might have been due to dehydration.  She has no urinary symptoms or other infectious symptoms at this time.  Labs are reassuring.  Considered but no indication for inpatient admission at this time, she safe for outpatient management.  Daughter at asked about continuation of her blood pressure medications given that patient has lost a bunch of weight.  Did discuss with her that her blood pressures are stable here.  Instructed her to follow-up with primary care to see if she needs adjustments to her blood pressure medications.  Will discharge with strict return precautions, they also placed a referral to cardiology.  Clinical Course as of 10/16/24 1751  Tue Oct 16, 2024  1333 Orthostatic. Syncope with PT after standing today [DS]  1432 Reassessed, discussed with daughter over the phone as well as granddaughter at the bedside [DS]  1539 Urinalysis, Routine w reflex microscopic -Urine, Clean Catch(!) Has 6-10 squamous cells, rare bacteria, no leukocytes.  No urinary symptoms at this time.  Will hold off treatment. [TT]  1612 Troponin T, High Sensitivity Troponin x 2 is not elevated [TT]    Clinical Course User Index [DS] Claudene Rover, MD [TT] Waymond Lorelle Cummins, MD     Medications  lactated ringers  bolus 1,000  mL (0 mLs Intravenous Stopped 10/16/24 1523)     ED Discharge Orders          Ordered    Ambulatory referral to Cardiology       Comments: If you have not heard from the Cardiology office within the next 72 hours please call [phone].   10/16/24 1750           Final diagnoses:  Orthostasis  Syncope, unspecified syncope type      Waymond Lorelle Cummins, MD 10/16/24 1751

## 2024-10-16 NOTE — Discharge Instructions (Signed)
 Please make sure to keep yourself hydrated.  Please follow-up with primary care doctor this week or early next week to get reassessed and to discuss if you need any medication changes to your blood pressure medications.  I have also put in a cardiology referral for you, please be sure to follow-up.

## 2024-10-25 ENCOUNTER — Inpatient Hospital Stay: Admitting: Family

## 2024-10-29 ENCOUNTER — Ambulatory Visit: Admitting: Family

## 2024-12-03 ENCOUNTER — Ambulatory Visit: Admitting: Internal Medicine

## 2025-01-01 ENCOUNTER — Ambulatory Visit: Admitting: Internal Medicine
